# Patient Record
Sex: Male | Born: 1980 | Race: White | Hispanic: No | Marital: Single | Smoking: Never smoker
Health system: Southern US, Community
[De-identification: ages and names within clinical notes are randomized; demographics above are authoritative.]

## PROBLEM LIST (undated history)

## (undated) ENCOUNTER — Emergency Department (HOSPITAL_COMMUNITY): Admission: EM | Discharge status: Against Medical Advice | Payer: BC Managed Care – PPO

## (undated) DIAGNOSIS — G319 Degenerative disease of nervous system, unspecified: Secondary | ICD-10-CM

## (undated) DIAGNOSIS — T8859XA Other complications of anesthesia, initial encounter: Secondary | ICD-10-CM

## (undated) DIAGNOSIS — T4145XA Adverse effect of unspecified anesthetic, initial encounter: Secondary | ICD-10-CM

## (undated) DIAGNOSIS — G809 Cerebral palsy, unspecified: Secondary | ICD-10-CM

## (undated) DIAGNOSIS — D696 Thrombocytopenia, unspecified: Secondary | ICD-10-CM

## (undated) DIAGNOSIS — M419 Scoliosis, unspecified: Secondary | ICD-10-CM

## (undated) DIAGNOSIS — M7040 Prepatellar bursitis, unspecified knee: Secondary | ICD-10-CM

## (undated) DIAGNOSIS — E785 Hyperlipidemia, unspecified: Secondary | ICD-10-CM

## (undated) DIAGNOSIS — H9190 Unspecified hearing loss, unspecified ear: Secondary | ICD-10-CM

## (undated) DIAGNOSIS — IMO0001 Reserved for inherently not codable concepts without codable children: Secondary | ICD-10-CM

## (undated) DIAGNOSIS — J309 Allergic rhinitis, unspecified: Secondary | ICD-10-CM

## (undated) DIAGNOSIS — G709 Myoneural disorder, unspecified: Secondary | ICD-10-CM

## (undated) DIAGNOSIS — T7840XA Allergy, unspecified, initial encounter: Secondary | ICD-10-CM

## (undated) DIAGNOSIS — Z87442 Personal history of urinary calculi: Secondary | ICD-10-CM

## (undated) DIAGNOSIS — N200 Calculus of kidney: Secondary | ICD-10-CM

## (undated) HISTORY — PX: MYRINGOTOMY: SUR874

## (undated) HISTORY — DX: Reserved for inherently not codable concepts without codable children: IMO0001

## (undated) HISTORY — DX: Scoliosis, unspecified: M41.9

## (undated) HISTORY — DX: Unspecified hearing loss, unspecified ear: H91.90

## (undated) HISTORY — PX: ADENOIDECTOMY: SUR15

## (undated) HISTORY — PX: OTHER SURGICAL HISTORY: SHX169

## (undated) HISTORY — DX: Allergy, unspecified, initial encounter: T78.40XA

## (undated) HISTORY — DX: Thrombocytopenia, unspecified: D69.6

## (undated) HISTORY — PX: EYE SURGERY: SHX253

## (undated) HISTORY — DX: Hyperlipidemia, unspecified: E78.5

## (undated) HISTORY — DX: Prepatellar bursitis, unspecified knee: M70.40

## (undated) HISTORY — DX: Degenerative disease of nervous system, unspecified: G31.9

## (undated) HISTORY — DX: Cerebral palsy, unspecified: G80.9

---

## 2005-04-19 ENCOUNTER — Ambulatory Visit: Payer: Self-pay | Admitting: Internal Medicine

## 2005-05-10 ENCOUNTER — Ambulatory Visit: Payer: Self-pay | Admitting: Internal Medicine

## 2006-08-29 ENCOUNTER — Ambulatory Visit: Payer: Self-pay | Admitting: Internal Medicine

## 2007-06-26 DIAGNOSIS — Q150 Congenital glaucoma: Secondary | ICD-10-CM | POA: Insufficient documentation

## 2007-06-26 DIAGNOSIS — M4120 Other idiopathic scoliosis, site unspecified: Secondary | ICD-10-CM | POA: Insufficient documentation

## 2007-06-26 DIAGNOSIS — H900 Conductive hearing loss, bilateral: Secondary | ICD-10-CM

## 2007-06-26 DIAGNOSIS — G809 Cerebral palsy, unspecified: Secondary | ICD-10-CM

## 2007-06-26 DIAGNOSIS — D696 Thrombocytopenia, unspecified: Secondary | ICD-10-CM | POA: Insufficient documentation

## 2007-06-26 DIAGNOSIS — J309 Allergic rhinitis, unspecified: Secondary | ICD-10-CM | POA: Insufficient documentation

## 2007-06-28 ENCOUNTER — Ambulatory Visit: Payer: Self-pay | Admitting: Internal Medicine

## 2007-06-28 LAB — CONVERTED CEMR LAB
ALT: 23 units/L (ref 0–53)
AST: 15 units/L (ref 0–37)
Albumin: 3.9 g/dL (ref 3.5–5.2)
Alkaline Phosphatase: 59 units/L (ref 39–117)
BUN: 11 mg/dL (ref 6–23)
Basophils Absolute: 0 10*3/uL (ref 0.0–0.1)
Basophils Relative: 0.1 % (ref 0.0–1.0)
Bilirubin Urine: NEGATIVE
Bilirubin, Direct: 0.1 mg/dL (ref 0.0–0.3)
Blood in Urine, dipstick: NEGATIVE
CO2: 32 meq/L (ref 19–32)
Calcium: 9.8 mg/dL (ref 8.4–10.5)
Chloride: 107 meq/L (ref 96–112)
Cholesterol: 215 mg/dL (ref 0–200)
Creatinine, Ser: 0.8 mg/dL (ref 0.4–1.5)
Direct LDL: 157.8 mg/dL
Eosinophils Absolute: 0.5 10*3/uL (ref 0.0–0.6)
Eosinophils Relative: 7.3 % — ABNORMAL HIGH (ref 0.0–5.0)
GFR calc Af Amer: 149 mL/min
GFR calc non Af Amer: 123 mL/min
Glucose, Bld: 89 mg/dL (ref 70–99)
Glucose, Urine, Semiquant: NEGATIVE
HCT: 45.8 % (ref 39.0–52.0)
HDL: 38.2 mg/dL — ABNORMAL LOW (ref >39.0)
Hemoglobin: 15.5 g/dL (ref 13.0–17.0)
Ketones, urine, test strip: NEGATIVE
Lymphocytes Relative: 25.2 % (ref 12.0–46.0)
MCHC: 33.8 g/dL (ref 30.0–36.0)
MCV: 91.7 fL (ref 78.0–100.0)
Monocytes Absolute: 0.7 10*3/uL (ref 0.2–0.7)
Monocytes Relative: 9.1 % (ref 3.0–11.0)
Neutro Abs: 4.3 10*3/uL (ref 1.4–7.7)
Neutrophils Relative %: 58.3 % (ref 43.0–77.0)
Nitrite: NEGATIVE
Platelets: 129 10*3/uL — ABNORMAL LOW (ref 150–400)
Potassium: 4.3 meq/L (ref 3.5–5.1)
Protein, U semiquant: NEGATIVE
RBC: 4.99 M/uL (ref 4.22–5.81)
RDW: 11.9 % (ref 11.5–14.6)
Sodium: 142 meq/L (ref 135–145)
Specific Gravity, Urine: 1.02
TSH: 0.78 microintl units/mL (ref 0.35–5.50)
Total Bilirubin: 1.4 mg/dL — ABNORMAL HIGH (ref 0.3–1.2)
Total CHOL/HDL Ratio: 5.6
Total Protein: 6.6 g/dL (ref 6.0–8.3)
Triglycerides: 111 mg/dL (ref 0–149)
Urobilinogen, UA: NEGATIVE
VLDL: 22 mg/dL (ref 0–40)
WBC Urine, dipstick: NEGATIVE
WBC: 7.3 10*3/uL (ref 4.5–10.5)
pH: 7

## 2007-07-18 ENCOUNTER — Ambulatory Visit: Payer: Self-pay | Admitting: Internal Medicine

## 2007-07-18 DIAGNOSIS — E785 Hyperlipidemia, unspecified: Secondary | ICD-10-CM | POA: Insufficient documentation

## 2007-07-27 ENCOUNTER — Encounter: Payer: Self-pay | Admitting: Internal Medicine

## 2007-07-27 ENCOUNTER — Ambulatory Visit (HOSPITAL_COMMUNITY): Admission: RE | Admit: 2007-07-27 | Discharge: 2007-07-27 | Payer: Self-pay | Admitting: Internal Medicine

## 2007-07-31 ENCOUNTER — Telehealth: Payer: Self-pay | Admitting: Internal Medicine

## 2007-08-02 ENCOUNTER — Encounter: Admission: RE | Admit: 2007-08-02 | Discharge: 2007-08-02 | Payer: Self-pay | Admitting: Internal Medicine

## 2007-08-03 ENCOUNTER — Telehealth: Payer: Self-pay | Admitting: Internal Medicine

## 2007-08-08 ENCOUNTER — Encounter: Admission: RE | Admit: 2007-08-08 | Discharge: 2007-11-06 | Payer: Self-pay | Admitting: Internal Medicine

## 2007-08-28 ENCOUNTER — Encounter: Payer: Self-pay | Admitting: Internal Medicine

## 2007-12-24 ENCOUNTER — Telehealth (INDEPENDENT_AMBULATORY_CARE_PROVIDER_SITE_OTHER): Payer: Self-pay | Admitting: *Deleted

## 2008-04-01 ENCOUNTER — Encounter: Payer: Self-pay | Admitting: Internal Medicine

## 2008-04-01 ENCOUNTER — Telehealth (INDEPENDENT_AMBULATORY_CARE_PROVIDER_SITE_OTHER): Payer: Self-pay | Admitting: *Deleted

## 2008-07-28 ENCOUNTER — Encounter: Payer: Self-pay | Admitting: Internal Medicine

## 2008-10-14 ENCOUNTER — Telehealth: Payer: Self-pay | Admitting: Internal Medicine

## 2008-10-17 ENCOUNTER — Ambulatory Visit: Payer: Self-pay | Admitting: Internal Medicine

## 2008-11-11 ENCOUNTER — Encounter: Payer: Self-pay | Admitting: Internal Medicine

## 2008-11-28 ENCOUNTER — Ambulatory Visit: Payer: Self-pay | Admitting: Family Medicine

## 2008-11-28 DIAGNOSIS — A088 Other specified intestinal infections: Secondary | ICD-10-CM

## 2008-11-28 DIAGNOSIS — L02419 Cutaneous abscess of limb, unspecified: Secondary | ICD-10-CM

## 2008-11-28 DIAGNOSIS — L03119 Cellulitis of unspecified part of limb: Secondary | ICD-10-CM

## 2008-11-29 ENCOUNTER — Telehealth (INDEPENDENT_AMBULATORY_CARE_PROVIDER_SITE_OTHER): Payer: Self-pay | Admitting: *Deleted

## 2008-11-29 ENCOUNTER — Inpatient Hospital Stay (HOSPITAL_COMMUNITY): Admission: EM | Admit: 2008-11-29 | Discharge: 2008-12-09 | Payer: Self-pay | Admitting: Emergency Medicine

## 2008-11-29 ENCOUNTER — Ambulatory Visit: Payer: Self-pay | Admitting: Internal Medicine

## 2008-11-29 ENCOUNTER — Encounter: Payer: Self-pay | Admitting: Internal Medicine

## 2008-11-29 DIAGNOSIS — M009 Pyogenic arthritis, unspecified: Secondary | ICD-10-CM | POA: Insufficient documentation

## 2008-11-29 DIAGNOSIS — R1011 Right upper quadrant pain: Secondary | ICD-10-CM | POA: Insufficient documentation

## 2008-11-29 DIAGNOSIS — N189 Chronic kidney disease, unspecified: Secondary | ICD-10-CM

## 2008-12-04 ENCOUNTER — Ambulatory Visit: Payer: Self-pay | Admitting: Vascular Surgery

## 2008-12-04 ENCOUNTER — Encounter (INDEPENDENT_AMBULATORY_CARE_PROVIDER_SITE_OTHER): Payer: Self-pay | Admitting: Orthopedic Surgery

## 2008-12-08 ENCOUNTER — Ambulatory Visit: Payer: Self-pay | Admitting: Infectious Diseases

## 2008-12-15 ENCOUNTER — Ambulatory Visit: Payer: Self-pay | Admitting: Internal Medicine

## 2010-01-14 ENCOUNTER — Encounter: Payer: Self-pay | Admitting: Internal Medicine

## 2010-02-16 ENCOUNTER — Encounter: Payer: Self-pay | Admitting: Internal Medicine

## 2010-08-10 ENCOUNTER — Encounter: Payer: Self-pay | Admitting: Internal Medicine

## 2010-08-25 ENCOUNTER — Telehealth: Payer: Self-pay | Admitting: Internal Medicine

## 2010-08-25 ENCOUNTER — Encounter: Payer: Self-pay | Admitting: Internal Medicine

## 2010-11-14 HISTORY — PX: OTHER SURGICAL HISTORY: SHX169

## 2010-11-30 ENCOUNTER — Encounter: Payer: Self-pay | Admitting: Internal Medicine

## 2010-12-14 NOTE — Letter (Signed)
Summary: Eye Exam/[facility]  Eye [facility]   Imported By: Maryln Gottron 08/19/2010 13:31:50  _____________________________________________________________________  External Attachment:    Type:   Image     Comment:   External Document

## 2010-12-14 NOTE — Letter (Signed)
Summary: Generic Letter  [place] at [facility]  [address] [place], [place] [address]   Phone: [phone]  Fax: [phone]    08/25/2010  [facility] [address] [place], [place]  [address]  TO WHOM IT MAY CONCERN  Due to this pt's medical condition........cerebral palsy, and chronic use of wheelchair, he needs to have a wheelchair accessible bathroom.  If you have any other questions, please do not hesitate to contact us.           Sincerely,   Birdie Sons, MD

## 2010-12-14 NOTE — Letter (Signed)
Summary: Eye Exam/[facility]  Eye [facility]   Imported By: Maryln Gottron 02/23/2010 15:26:00  _____________________________________________________________________  External Attachment:    Type:   Image     Comment:   External Document

## 2010-12-14 NOTE — Letter (Signed)
Summary: Generic Letter  [place] at [facility]  [address] [place], [place] [address]   Phone: [phone]  Fax: [phone]    01/14/2010  [facility] [address] [place], [place]  [address]   To Whom It May Concern,  The above patient has cerebral palsy and scoliosis so that he is wheelchair bound.  These are permanent disabilities.  He also has conductive hearing loss so that he is unable to communicate with others and this too is permanent.  Due to these disablities [place]  He requires the [employer] bus that accommodates his wheelchair in the lock-in-mode as he is unable to transfer on and off the wheelchair by himself.   Thankyou for this consideration.   Arayna Illescas Rexene Edison Missi Mcmackin, MD          Sincerely,   Gladis Riffle, RN

## 2010-12-14 NOTE — Progress Notes (Signed)
Summary: WHEEL CHAIR ACCESSIBLE BATHROOM  Phone Note Call from Patient Call back at Home Phone [phone]   Caller: Mom-[name] Call For: Birdie Sons MD Summary of Call: Mom IS REQUESTING A LETTER OF MEDICAL NECESSITY FOR PT TO HAVE WHEELCHAIR ACCESSIBLE BATHROOM. Initial call taken by: Heron Sabins,  August 25, 2010 10:20 AM  Follow-up for Phone Call        ok Follow-up by: Birdie Sons MD,  August 25, 2010 11:50 AM  Additional Follow-up for Phone Call Additional follow up Details #1::        Letter completed and Mom notified. Additional Follow-up by: Lynann Beaver CMA,  August 25, 2010 12:51 PM

## 2010-12-22 NOTE — Letter (Signed)
Summary: [facility]   Imported By: Maryln Gottron 12/14/2010 12:52:02  _____________________________________________________________________  External Attachment:    Type:   Image     Comment:   External Document

## 2011-01-24 ENCOUNTER — Encounter: Payer: Self-pay | Admitting: Internal Medicine

## 2011-01-27 ENCOUNTER — Other Ambulatory Visit: Payer: BC Managed Care – PPO

## 2011-01-27 ENCOUNTER — Other Ambulatory Visit: Payer: Self-pay | Admitting: Internal Medicine

## 2011-01-27 ENCOUNTER — Encounter (INDEPENDENT_AMBULATORY_CARE_PROVIDER_SITE_OTHER): Payer: Self-pay | Admitting: *Deleted

## 2011-01-27 DIAGNOSIS — Z0000 Encounter for general adult medical examination without abnormal findings: Secondary | ICD-10-CM

## 2011-01-27 DIAGNOSIS — E785 Hyperlipidemia, unspecified: Secondary | ICD-10-CM

## 2011-01-27 LAB — LDL CHOLESTEROL, DIRECT: Direct LDL: 163.1 mg/dL

## 2011-01-27 LAB — BASIC METABOLIC PANEL
BUN: 15 mg/dL (ref 6–23)
Calcium: 9.5 mg/dL (ref 8.4–10.5)
GFR: 111.76 mL/min (ref >60.00)
Potassium: 4 mEq/L (ref 3.5–5.1)
Sodium: 140 mEq/L (ref 135–145)

## 2011-01-27 LAB — URINALYSIS
Hgb urine dipstick: NEGATIVE
Leukocytes, UA: NEGATIVE
Nitrite: NEGATIVE
Specific Gravity, Urine: 1.015 (ref 1.000–1.030)
Urine Glucose: NEGATIVE
Urobilinogen, UA: 0.2 (ref 0.0–1.0)

## 2011-01-27 LAB — HEPATIC FUNCTION PANEL
ALT: 24 U/L (ref 0–53)
AST: 16 U/L (ref 0–37)
Alkaline Phosphatase: 65 U/L (ref 39–117)
Bilirubin, Direct: 0.1 mg/dL (ref 0.0–0.3)
Total Bilirubin: 0.9 mg/dL (ref 0.3–1.2)

## 2011-01-27 LAB — TSH: TSH: 0.72 u[IU]/mL (ref 0.35–5.50)

## 2011-01-27 LAB — LIPID PANEL
HDL: 42.1 mg/dL (ref >39.00)
Total CHOL/HDL Ratio: 5
VLDL: 29.6 mg/dL (ref 0.0–40.0)

## 2011-01-27 LAB — CBC WITH DIFFERENTIAL/PLATELET
Basophils Absolute: 0 10*3/uL (ref 0.0–0.1)
Eosinophils Relative: 3.6 % (ref 0.0–5.0)
HCT: 44.4 % (ref 39.0–52.0)
Lymphocytes Relative: 21.2 % (ref 12.0–46.0)
Lymphs Abs: 1.4 10*3/uL (ref 0.7–4.0)
Monocytes Relative: 8.3 % (ref 3.0–12.0)
Platelets: 112 10*3/uL — ABNORMAL LOW (ref 150.0–400.0)
RDW: 12.8 % (ref 11.5–14.6)
WBC: 6.7 10*3/uL (ref 4.5–10.5)

## 2011-02-07 ENCOUNTER — Encounter: Payer: Self-pay | Admitting: Internal Medicine

## 2011-02-28 LAB — URINALYSIS, ROUTINE W REFLEX MICROSCOPIC
Bilirubin Urine: NEGATIVE
Ketones, ur: NEGATIVE mg/dL
Leukocytes, UA: NEGATIVE
Nitrite: NEGATIVE
Protein, ur: 100 mg/dL — AB
pH: 5.5 (ref 5.0–8.0)

## 2011-02-28 LAB — COMPREHENSIVE METABOLIC PANEL
ALT: 31 U/L (ref 0–53)
Alkaline Phosphatase: 78 U/L (ref 39–117)
Alkaline Phosphatase: 82 U/L (ref 39–117)
BUN: 11 mg/dL (ref 6–23)
BUN: 35 mg/dL — ABNORMAL HIGH (ref 6–23)
CO2: 29 mEq/L (ref 19–32)
GFR calc non Af Amer: 47 mL/min — ABNORMAL LOW (ref >60)
Glucose, Bld: 107 mg/dL — ABNORMAL HIGH (ref 70–99)
Glucose, Bld: 93 mg/dL (ref 70–99)
Potassium: 3.6 mEq/L (ref 3.5–5.1)
Potassium: 4.1 mEq/L (ref 3.5–5.1)
Sodium: 135 mEq/L (ref 135–145)
Total Protein: 5.8 g/dL — ABNORMAL LOW (ref 6.0–8.3)

## 2011-02-28 LAB — DIFFERENTIAL
Basophils Absolute: 0 10*3/uL (ref 0.0–0.1)
Basophils Absolute: 0 10*3/uL (ref 0.0–0.1)
Basophils Relative: 0 % (ref 0–1)
Basophils Relative: 0 % (ref 0–1)
Eosinophils Absolute: 0 10*3/uL (ref 0.0–0.7)
Eosinophils Absolute: 0.2 10*3/uL (ref 0.0–0.7)
Eosinophils Absolute: 0.3 10*3/uL (ref 0.0–0.7)
Eosinophils Relative: 0 % (ref 0–5)
Eosinophils Relative: 1 % (ref 0–5)
Eosinophils Relative: 2 % (ref 0–5)
Lymphocytes Relative: 1 % — ABNORMAL LOW (ref 12–46)
Lymphocytes Relative: 3 % — ABNORMAL LOW (ref 12–46)
Lymphs Abs: 0.2 10*3/uL — ABNORMAL LOW (ref 0.7–4.0)
Lymphs Abs: 0.5 10*3/uL — ABNORMAL LOW (ref 0.7–4.0)
Lymphs Abs: 2.7 10*3/uL (ref 0.7–4.0)
Monocytes Absolute: 0.3 10*3/uL (ref 0.1–1.0)
Monocytes Absolute: 0.7 10*3/uL (ref 0.1–1.0)
Monocytes Absolute: 1.2 10*3/uL — ABNORMAL HIGH (ref 0.1–1.0)
Monocytes Relative: 2 % — ABNORMAL LOW (ref 3–12)
Monocytes Relative: 3 % (ref 3–12)
Monocytes Relative: 9 % (ref 3–12)
Neutro Abs: 15.1 10*3/uL — ABNORMAL HIGH (ref 1.7–7.7)
Neutro Abs: 23.1 10*3/uL — ABNORMAL HIGH (ref 1.7–7.7)
Neutrophils Relative %: 94 % — ABNORMAL HIGH (ref 43–77)
Neutrophils Relative %: 96 % — ABNORMAL HIGH (ref 43–77)
WBC Morphology: INCREASED
WBC Morphology: INCREASED

## 2011-02-28 LAB — CBC
HCT: 37.4 % — ABNORMAL LOW (ref 39.0–52.0)
HCT: 37.8 % — ABNORMAL LOW (ref 39.0–52.0)
HCT: 37.9 % — ABNORMAL LOW (ref 39.0–52.0)
HCT: 39.3 % (ref 39.0–52.0)
HCT: 39.5 % (ref 39.0–52.0)
HCT: 42.1 % (ref 39.0–52.0)
HCT: 48.8 % (ref 39.0–52.0)
Hemoglobin: 12.5 g/dL — ABNORMAL LOW (ref 13.0–17.0)
Hemoglobin: 12.9 g/dL — ABNORMAL LOW (ref 13.0–17.0)
Hemoglobin: 13.1 g/dL (ref 13.0–17.0)
Hemoglobin: 13.2 g/dL (ref 13.0–17.0)
Hemoglobin: 14.1 g/dL (ref 13.0–17.0)
Hemoglobin: 16.4 g/dL (ref 13.0–17.0)
MCHC: 33.5 g/dL (ref 30.0–36.0)
MCHC: 33.6 g/dL (ref 30.0–36.0)
MCHC: 33.6 g/dL (ref 30.0–36.0)
MCHC: 34.2 g/dL (ref 30.0–36.0)
MCHC: 34.4 g/dL (ref 30.0–36.0)
MCV: 89.8 fL (ref 78.0–100.0)
MCV: 91.8 fL (ref 78.0–100.0)
MCV: 91.9 fL (ref 78.0–100.0)
MCV: 91.9 fL (ref 78.0–100.0)
MCV: 92.1 fL (ref 78.0–100.0)
MCV: 92.2 fL (ref 78.0–100.0)
MCV: 93.4 fL (ref 78.0–100.0)
Platelets: 164 10*3/uL (ref 150–400)
Platelets: 431 10*3/uL — ABNORMAL HIGH (ref 150–400)
Platelets: 451 10*3/uL — ABNORMAL HIGH (ref 150–400)
Platelets: 81 10*3/uL — ABNORMAL LOW (ref 150–400)
Platelets: 89 10*3/uL — ABNORMAL LOW (ref 150–400)
Platelets: 93 10*3/uL — ABNORMAL LOW (ref 150–400)
Platelets: 94 10*3/uL — ABNORMAL LOW (ref 150–400)
RBC: 4.11 MIL/uL — ABNORMAL LOW (ref 4.22–5.81)
RBC: 4.28 MIL/uL (ref 4.22–5.81)
RBC: 4.29 MIL/uL (ref 4.22–5.81)
RBC: 4.33 MIL/uL (ref 4.22–5.81)
RBC: 4.58 MIL/uL (ref 4.22–5.81)
RBC: 5.3 MIL/uL (ref 4.22–5.81)
RDW: 12.3 % (ref 11.5–15.5)
RDW: 12.5 % (ref 11.5–15.5)
RDW: 12.7 % (ref 11.5–15.5)
RDW: 12.7 % (ref 11.5–15.5)
RDW: 12.8 % (ref 11.5–15.5)
RDW: 13.1 % (ref 11.5–15.5)
WBC: 13.6 10*3/uL — ABNORMAL HIGH (ref 4.0–10.5)
WBC: 16.1 10*3/uL — ABNORMAL HIGH (ref 4.0–10.5)
WBC: 17.8 10*3/uL — ABNORMAL HIGH (ref 4.0–10.5)
WBC: 18 10*3/uL — ABNORMAL HIGH (ref 4.0–10.5)
WBC: 18.6 10*3/uL — ABNORMAL HIGH (ref 4.0–10.5)
WBC: 24 10*3/uL — ABNORMAL HIGH (ref 4.0–10.5)

## 2011-02-28 LAB — BASIC METABOLIC PANEL
BUN: 29 mg/dL — ABNORMAL HIGH (ref 6–23)
BUN: 9 mg/dL (ref 6–23)
CO2: 25 mEq/L (ref 19–32)
CO2: 26 mEq/L (ref 19–32)
Calcium: 7.8 mg/dL — ABNORMAL LOW (ref 8.4–10.5)
Calcium: 8.6 mg/dL (ref 8.4–10.5)
Chloride: 102 mEq/L (ref 96–112)
Chloride: 105 mEq/L (ref 96–112)
Creatinine, Ser: 0.86 mg/dL (ref 0.4–1.5)
Creatinine, Ser: 1.39 mg/dL (ref 0.4–1.5)
GFR calc Af Amer: >60 mL/min (ref >60)
GFR calc Af Amer: >60 mL/min (ref >60)
GFR calc non Af Amer: >60 mL/min (ref >60)
GFR calc non Af Amer: >60 mL/min (ref >60)
Glucose, Bld: 111 mg/dL — ABNORMAL HIGH (ref 70–99)
Glucose, Bld: 93 mg/dL (ref 70–99)
Potassium: 3.1 mEq/L — ABNORMAL LOW (ref 3.5–5.1)
Potassium: 3.3 mEq/L — ABNORMAL LOW (ref 3.5–5.1)
Sodium: 136 mEq/L (ref 135–145)
Sodium: 137 mEq/L (ref 135–145)

## 2011-02-28 LAB — CULTURE, BLOOD (ROUTINE X 2): Culture: NO GROWTH

## 2011-02-28 LAB — URINE MICROSCOPIC-ADD ON

## 2011-02-28 LAB — LIPASE, BLOOD: Lipase: 16 U/L (ref 11–59)

## 2011-02-28 LAB — RAPID STREP SCREEN (MED CTR MEBANE ONLY): Streptococcus, Group A Screen (Direct): NEGATIVE

## 2011-03-14 ENCOUNTER — Encounter: Payer: Self-pay | Admitting: Internal Medicine

## 2011-03-14 ENCOUNTER — Ambulatory Visit (INDEPENDENT_AMBULATORY_CARE_PROVIDER_SITE_OTHER): Payer: BC Managed Care – PPO | Admitting: Internal Medicine

## 2011-03-14 VITALS — BP 124/86 | HR 88 | Temp 98.3°F | Ht 68.0 in | Wt 150.0 lb

## 2011-03-14 DIAGNOSIS — Z0000 Encounter for general adult medical examination without abnormal findings: Secondary | ICD-10-CM

## 2011-03-14 NOTE — Progress Notes (Signed)
  Subjective:    Patient ID: Edward Alvarado, male    DOB: [birth year], 30 y.o.   MRN: [id number]  HPI  cpx  Past Medical History  Diagnosis Date  . Allergy   . Cerebral palsy   . Scoliosis   . Deafness     hearing aids  . Glaucoma   . Hyperlipidemia   . Platelets decreased    Past Surgical History  Procedure Date  . Multiple orthopedic procedures   . Myringotomy   . Eye surgery 2012    [facility]    reports that he has never smoked. He has never used smokeless tobacco. He reports that he does not drink alcohol. His drug history not on file. family history is not on file. Allergies  Allergen Reactions  . Iodine     REACTION: sensitivity     Review of Systems  patient denies chest pain, shortness of breath, orthopnea. Denies lower extremity edema, abdominal pain, change in appetite, change in bowel movements. Patient denies rashes, musculoskeletal complaints. No other specific complaints in a complete review of systems.  He is wheelchair bound Chronic hearing loss     Objective:   Physical Exam  well-developed well-nourished male in no acute distress. HEENT exam atraumatic, normocephalic, neck supple without jugular venous distention. Chest clear to auscultation cardiac exam S1-S2 are regular. Abdominal exam overweight with bowel sounds, soft and nontender. Extremities no edema. Neurologic exam is alert. Surgical scar on back     Assessment & Plan:  Well visit Health maint utd

## 2011-03-24 ENCOUNTER — Other Ambulatory Visit: Payer: Self-pay | Admitting: Internal Medicine

## 2011-03-24 DIAGNOSIS — G809 Cerebral palsy, unspecified: Secondary | ICD-10-CM

## 2011-03-24 NOTE — Progress Notes (Signed)
Also fax order to Bhavik Cabiness [phone]

## 2011-03-29 NOTE — Discharge Summary (Signed)
NAMEAZARION, HOVE              ACCOUNT NO.:  [id number]   MEDICAL RECORD NO.:  [id number]          PATIENT TYPE:  INP   LOCATION:  5040                         FACILITY:  [facility]   PHYSICIAN:  Valerie A. Felicity Coyer, MDDATE OF BIRTH:  1981/06/15   DATE OF ADMISSION:  11/29/2008  DATE OF DISCHARGE:  12/09/2008                               DISCHARGE SUMMARY   DISCHARGE DIAGNOSES:  1. Right lower extremity cellulitis secondary to patellar bursitis,      improved, continue outpatient antibiotics 1 week more, see details      below.  2. Leukocytosis with low-grade fever secondary to above, improved,      discharge white count 11.6 with T-max 100.4, close outpatient      followup.  3. Dehydration prior to admission, resolved.  4. Cerebral palsy with congenital deafness, at baseline.  5. Mild chronic thrombocytopenia.  6. Mild anemia, question laboratory error.   DISCHARGE MEDICATIONS:  1. Doxycycline 100 mg p.o. b.i.d. x7 additional days.  2. Avelox 400 mg p.o. daily x7 additional days.   OTHER MEDICATIONS:  As prior to admission include:  1. Loratadine 10 mg p.o. daily.  2. Multivitamin once daily.  3. Combigan ophthalmic drops 1 drop bilaterally b.i.d.  4. Lumigan ophthalmic drops 1 drop bilaterally b.i.d.   DISPOSITION:  The patient is discharged home where he is under the care  of his parents.  He is wheelchair bound with recommendations to remain  off his right knee for the next week while on antibiotics, though he has  clinically much improved with minimal swelling.  No warmth and no  erythema of his right lower extremity.  Hospital followup is scheduled  with primary care physician, Dr. Birdie Sons, for Monday, December 15, 2008, at 8:45 a.m.   CONSULTATIONS ON THIS HOSPITALIZATION:  1. Orthopedics, Leonides Grills, MD, [facility], to call as      needed.  2. Infectious Disease, Mick Sell, MD   MRI at the time of admission shows no evidence of septic  arthritis.   Lower extremity Dopplers performed on December 04, 2008, showed no  evidence of DVT or superficial thrombus bilaterally.   CONDITION ON DISCHARGE:  Medically improved and stable, asymptomatic and  clinically well.   HOSPITAL COURSE BY PROBLEMS:  1. Right lower extremity cellulitis with patellar bursitis:  The      patient is a 30 year old gentleman, wheelchair bound but unable to      assist in transfers, who is congenitally deaf, and has cerebral      palsy, came to the emergency room with less than 24 hours of sudden      onset redness and swelling of his right lower extremity.  There was      no obvious injury to his leg or knee in the preceding days or hours      to the onset of symptoms, and they came to the emergency room for      further evaluation where he was found to be markedly febrile with      concern for pyogenic arthritis due to a white cell  count at 24.  He      was seen in consultation by Dr. Lestine Box in [facility],      and he underwent an MRI prior considering a knee scope.      Fortunately, this showed no evidence of joint involvement with no      abscess or joint effusion but marked bursitis changes with      associated cellulitis at the knee and leg.  As such, an option was      made to treat him only with IV antibiotics, so vancomycin and Zosyn      were continued.  The patient continued to have high spiking fevers      throughout the early part of this hospitalization, ranging up to      105.0 and 102, gradually downward decreasing, treated with Tylenol      and Advil on an alternating basis.  His white count was improving      but then began to rise again despite clinical improvement in the      patient's symptoms and gradual defervescence.  After a week of IV      antibiotics, he was changed to oral doxycycline with Avelox.  Given      his clinical improvement but persisted in having a white count of      18, so he was seen in  consultation by Infectious Disease who felt      that having no other clinical symptoms of other ongoing infection      that the persisting low-grade fevers and leukocytosis were still      likely due to septic bursitis and recommended continued treatment      as ongoing given clinical improvement.  The patient persisted with      his clinical improvement including resolution of his erythema and      continued decreased swelling of his knee and leg.  There was no      evidence of DVT, and his white count also began to improve down to      13.6 and then 11.6 on the day of discharge.  He was felt stable for      discharge home from a medical standpoint as he is hemodynamically      stable with only low-grade fevers.  He will continue another week      of oral antibiotics to ensure complete resolution with close      outpatient followup with primary MD.  Within this time for a      recheck of labs is indicated.  This has been reviewed with the      patient's parents who are responsible for his care, who had      verbalized understanding and agreement.  They will contact their      orthopedic physician on an as-needed basis with reports available      within the [facility] for review as needed or as desired.  All      other medical issues are stable and without change.   Greater than 30 minutes on the day of discharge for coordination of  discharge planning.      Valerie A. Felicity Coyer, MD  Electronically Signed     VAL/MEDQ  D:  12/09/2008  T:  12/10/2008  Job:  [id number]

## 2011-03-29 NOTE — Consult Note (Signed)
NAMEYAZIR, KOERBER              ACCOUNT NO.:  [id number]   MEDICAL RECORD NO.:  [id number]          PATIENT TYPE:  INP   LOCATION:  3313                         FACILITY:  [facility]   PHYSICIAN:  Leonides Grills, M.D.     DATE OF BIRTH:  [birth year]   DATE OF CONSULTATION:  11/30/2008  DATE OF DISCHARGE:                                 CONSULTATION   CHIEF COMPLAINT:  Right knee, leg pain and redness x3 days after  extensive crawling.   HISTORY:  This is a 30 year old male who has extensive cerebral palsy,  nonambulatory, and essentially crawls from place to place.  He had a  previous I&D of prepatellar bursitis in the past.  His right knee has  been progressively getting swollen and red and extending down to  pretibial area.  He has cerebral palsy and is deaf.  He has allergy to  iodine.  He does not smoke and takes Tylenol p.r.n.   PHYSICAL EXAMINATION:  On admission to the ER, his temperature was 99.4  and has been rising since.  He has a range of motion of his right knee  from approximately 10-90 degrees with minimal discomfort.  He has  erythema in anterior aspect of the right knee, that extends into the  pretibial area.  He has a boggy swelling to the pretibial area that is  symmetric bilaterally, even on the uninvolved contralateral knee.  Palpable dorsalis pedis and posterior tibial pulses.  He is intact to  light touch.  Current temperature is 101.4.  White count is 24.  MRI  shows extensive prepatellar cellulitis, no evidence of abscess or knee  effusion.   IMPRESSION:  Cellulitis, right knee and leg.   PLAN:  At this point, there is no surgical intervention.  He is to  receive IV antibiotics.  He is currently on     Zosyn and vancomycin.  Warm moist compresses to the right leg and knee are encouraged.      Leonides Grills, M.D.  Electronically Signed     PB/MEDQ  D:  11/30/2008  T:  11/30/2008  Job:  [id number]

## 2011-04-01 NOTE — Letter (Signed)
January 31, 2007     RE:  Edward Alvarado, Edward Alvarado  MRN:  [id number]  /  DOB:  01-22-1981   To whom it may concern:   Edward Alvarado has been a longterm patient of mine.  He suffers with  cerebral palsy.  He has been, and will continue to be, completely and  totally disabled.  He requires 24-hour 7-day-a-week care.  If you have  any questions, please contact me.    Sincerely,      Bruce H. Swords, MD  Electronically Signed    BHS/MedQ  DD: 01/31/2007  DT: 01/31/2007  Job #: [id number]

## 2011-09-13 ENCOUNTER — Telehealth: Payer: Self-pay | Admitting: Internal Medicine

## 2011-09-13 NOTE — Telephone Encounter (Signed)
Pt  decline to see another MD. Pt has loss of appetite over a month now with wt loss. Dad is requesting ov with dr swords wed or thur this week. Doc has ov slot for 09-16-2011 dad also decline.

## 2011-09-15 NOTE — Telephone Encounter (Signed)
Have pt come in at 8:15 tomorrow per Dr Cato Mulligan

## 2011-09-15 NOTE — Telephone Encounter (Signed)
Pt dad larry cross would like dr to call him personally. Pt unable to come in tomorrow.

## 2011-09-16 NOTE — Telephone Encounter (Signed)
Dad called again. He would really like to talk to Dr Cato Mulligan. Dad refused several offers to see Another md. Please advise. Thanks.

## 2011-09-29 NOTE — Telephone Encounter (Signed)
Pt is sch for 11-07-11

## 2011-11-07 ENCOUNTER — Encounter: Payer: Self-pay | Admitting: Internal Medicine

## 2011-11-07 ENCOUNTER — Ambulatory Visit (INDEPENDENT_AMBULATORY_CARE_PROVIDER_SITE_OTHER): Payer: BC Managed Care – PPO | Admitting: Internal Medicine

## 2011-11-07 VITALS — BP 108/74 | HR 88 | Temp 97.6°F | Wt 118.0 lb

## 2011-11-07 DIAGNOSIS — R634 Abnormal weight loss: Secondary | ICD-10-CM | POA: Insufficient documentation

## 2011-11-07 LAB — CBC WITH DIFFERENTIAL/PLATELET
Basophils Relative: 0.3 % (ref 0.0–3.0)
Eosinophils Absolute: 0.3 10*3/uL (ref 0.0–0.7)
Eosinophils Relative: 4.7 % (ref 0.0–5.0)
Lymphocytes Relative: 24.7 % (ref 12.0–46.0)
MCHC: 34.4 g/dL (ref 30.0–36.0)
Neutrophils Relative %: 61.9 % (ref 43.0–77.0)
Platelets: 136 10*3/uL — ABNORMAL LOW (ref 150.0–400.0)
RBC: 5.02 Mil/uL (ref 4.22–5.81)
WBC: 6.1 10*3/uL (ref 4.5–10.5)

## 2011-11-07 LAB — BASIC METABOLIC PANEL
Calcium: 9.3 mg/dL (ref 8.4–10.5)
GFR: 128.48 mL/min (ref >60.00)
Sodium: 138 mEq/L (ref 135–145)

## 2011-11-07 LAB — HEPATIC FUNCTION PANEL
Alkaline Phosphatase: 78 U/L (ref 39–117)
Bilirubin, Direct: 0 mg/dL (ref 0.0–0.3)
Total Bilirubin: 0.7 mg/dL (ref 0.3–1.2)
Total Protein: 7.4 g/dL (ref 6.0–8.3)

## 2011-11-07 NOTE — Assessment & Plan Note (Signed)
Needs evaluation

## 2011-11-09 LAB — GLIA (IGA/G) + TTG IGA: Gliadin IgG: 4.8 U/mL (ref <20)

## 2011-11-10 ENCOUNTER — Encounter: Payer: Self-pay | Admitting: Internal Medicine

## 2011-11-10 ENCOUNTER — Other Ambulatory Visit: Payer: Self-pay | Admitting: Internal Medicine

## 2011-11-10 DIAGNOSIS — R634 Abnormal weight loss: Secondary | ICD-10-CM

## 2011-11-10 NOTE — Progress Notes (Signed)
Patient ID: Edward Alvarado, male   DOB: 05-20-1981, 30 y.o.   MRN: [id number] 29 year old patient with cerebral palsy and significant physical disabilities. He is also deaf. He has recently moved to his own home in a supervised facility. 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 He is being followed by ophthalmology at [facility]. The patient has no complaints. He is brought in by his parents concerned with weight loss. I reviewed flow sheets documenting a significant weight loss over the past year. Patient's parents recognize that he is eating less. There are times where he is quite hungry and eats a large meal but he is eating fewer of these and he is eating less regularly.  Past Medical History  Diagnosis Date  . Allergy   . Cerebral palsy   . Scoliosis   . Deafness     hearing aids  . Glaucoma   . Hyperlipidemia   . Platelets decreased    Past Surgical History  Procedure Date  . Multiple orthopedic procedures   . Myringotomy   . Eye surgery 2012    [facility]    reports that he has never smoked. He has never used smokeless tobacco. He reports that he does not drink alcohol. His drug history not on file. family history is not on file. Allergies  Allergen Reactions  . Iodine     REACTION: sensitivity    patient denies chest pain, shortness of breath, orthopnea. Denies lower extremity edema, abdominal pain, change in appetite, change in bowel movements. Patient denies rashes, musculoskeletal complaints. No other specific complaints in a complete review of systems.    On physical exam the patient appears in no acute distress. He is thin. He is sitting in a wheelchair. He has significant physical disabilities. HEENT exam atraumatic, neck is supple. Chest is clear to auscultation without increased work of breathing. Cardiac exam S1-S2 are regular. Abdominal exam active bowel sounds, soft. Extremities there is no significant  edema. Neurologic exam he is alert and interactive.

## 2011-11-14 ENCOUNTER — Telehealth: Payer: Self-pay | Admitting: *Deleted

## 2011-11-14 NOTE — Telephone Encounter (Signed)
Pts insurance has declined the CT and they are requesting you call the physician line.  The number is [phone] opt 3 Track# [id number]

## 2011-11-22 NOTE — Telephone Encounter (Signed)
Pt called to check status

## 2011-12-20 NOTE — Telephone Encounter (Signed)
Call patient Insurance won't cover imaging tests  Have them weigh Ed weekly...report weight in 4 weeks

## 2011-12-20 NOTE — Telephone Encounter (Signed)
pts mom aware.

## 2011-12-20 NOTE — Telephone Encounter (Signed)
Unsure about what this note means.  Thanks.

## 2012-03-12 ENCOUNTER — Other Ambulatory Visit (INDEPENDENT_AMBULATORY_CARE_PROVIDER_SITE_OTHER): Payer: BC Managed Care – PPO

## 2012-03-12 DIAGNOSIS — Z0000 Encounter for general adult medical examination without abnormal findings: Secondary | ICD-10-CM

## 2012-03-12 LAB — TSH: TSH: 1.26 u[IU]/mL (ref 0.35–5.50)

## 2012-03-12 LAB — LIPID PANEL
Cholesterol: 169 mg/dL (ref 0–200)
VLDL: 11.2 mg/dL (ref 0.0–40.0)

## 2012-03-12 LAB — HEPATIC FUNCTION PANEL
ALT: 17 U/L (ref 0–53)
AST: 16 U/L (ref 0–37)
Albumin: 3.9 g/dL (ref 3.5–5.2)
Total Protein: 6.8 g/dL (ref 6.0–8.3)

## 2012-03-12 LAB — CBC WITH DIFFERENTIAL/PLATELET
Basophils Relative: 0.3 % (ref 0.0–3.0)
Eosinophils Relative: 5.3 % — ABNORMAL HIGH (ref 0.0–5.0)
HCT: 44.3 % (ref 39.0–52.0)
Hemoglobin: 15.2 g/dL (ref 13.0–17.0)
Lymphs Abs: 1.7 10*3/uL (ref 0.7–4.0)
MCV: 90.9 fl (ref 78.0–100.0)
Monocytes Absolute: 0.5 10*3/uL (ref 0.1–1.0)
Monocytes Relative: 6.7 % (ref 3.0–12.0)
Neutro Abs: 4.5 10*3/uL (ref 1.4–7.7)
Platelets: 105 10*3/uL — ABNORMAL LOW (ref 150.0–400.0)
RBC: 4.87 Mil/uL (ref 4.22–5.81)
WBC: 7 10*3/uL (ref 4.5–10.5)

## 2012-03-12 LAB — POCT URINALYSIS DIPSTICK
Glucose, UA: NEGATIVE
Ketones, UA: NEGATIVE
Leukocytes, UA: NEGATIVE
Protein, UA: NEGATIVE
Spec Grav, UA: 1.025

## 2012-03-12 LAB — BASIC METABOLIC PANEL
BUN: 14 mg/dL (ref 6–23)
Chloride: 106 mEq/L (ref 96–112)
GFR: 128.2 mL/min (ref >60.00)
Potassium: 3.8 mEq/L (ref 3.5–5.1)
Sodium: 141 mEq/L (ref 135–145)

## 2012-03-19 ENCOUNTER — Encounter: Payer: BC Managed Care – PPO | Admitting: Internal Medicine

## 2012-05-02 ENCOUNTER — Encounter: Payer: Self-pay | Admitting: Internal Medicine

## 2012-05-02 ENCOUNTER — Ambulatory Visit (INDEPENDENT_AMBULATORY_CARE_PROVIDER_SITE_OTHER): Payer: BC Managed Care – PPO | Admitting: Internal Medicine

## 2012-05-02 VITALS — BP 112/70 | HR 76 | Temp 98.2°F | Wt 123.0 lb

## 2012-05-02 DIAGNOSIS — Z0000 Encounter for general adult medical examination without abnormal findings: Secondary | ICD-10-CM

## 2012-05-03 NOTE — Progress Notes (Signed)
Patient ID: Edward Alvarado, male   DOB: 05-07-1981, 31 y.o.   MRN: [id number]  CPX  See scanned forms for documentation related to care at domicillary Exam included  Well visit- health maint UTD

## 2012-06-28 ENCOUNTER — Telehealth: Payer: Self-pay | Admitting: Internal Medicine

## 2012-06-28 NOTE — Telephone Encounter (Signed)
Pts mom called and is filling out some forms. Need to know date of last tetanus shot pt rcvd. Also need to know date of last pneumonia vax.

## 2012-06-28 NOTE — Telephone Encounter (Signed)
Called mom and gave mom immunization information.

## 2012-08-14 ENCOUNTER — Ambulatory Visit: Payer: BC Managed Care – PPO | Admitting: Sports Medicine

## 2012-08-21 ENCOUNTER — Ambulatory Visit (INDEPENDENT_AMBULATORY_CARE_PROVIDER_SITE_OTHER): Payer: BC Managed Care – PPO | Admitting: Sports Medicine

## 2012-08-21 VITALS — BP 132/83 | HR 102 | Temp 97.8°F | Wt 125.5 lb

## 2012-08-21 DIAGNOSIS — H900 Conductive hearing loss, bilateral: Secondary | ICD-10-CM

## 2012-08-21 DIAGNOSIS — M7040 Prepatellar bursitis, unspecified knee: Secondary | ICD-10-CM

## 2012-08-21 DIAGNOSIS — J309 Allergic rhinitis, unspecified: Secondary | ICD-10-CM

## 2012-08-21 DIAGNOSIS — G809 Cerebral palsy, unspecified: Secondary | ICD-10-CM

## 2012-08-21 DIAGNOSIS — M4120 Other idiopathic scoliosis, site unspecified: Secondary | ICD-10-CM

## 2012-08-21 DIAGNOSIS — E785 Hyperlipidemia, unspecified: Secondary | ICD-10-CM

## 2012-08-21 DIAGNOSIS — H409 Unspecified glaucoma: Secondary | ICD-10-CM

## 2012-08-21 MED ORDER — ONDANSETRON 4 MG PO TBDP: 4.0000 mg | ORAL_TABLET | Freq: Three times a day (TID) | ORAL | Status: DC | PRN

## 2012-08-21 MED ORDER — LORATADINE 10 MG PO TABS: 10.0000 mg | ORAL_TABLET | Freq: Every day | ORAL | Status: DC

## 2012-08-21 MED ORDER — MENS MULTIVITAMIN PLUS PO TABS: 1.0000 | ORAL_TABLET | Freq: Every day | ORAL | Status: DC

## 2012-08-21 NOTE — Patient Instructions (Addendum)
It was nice to meet you.  Please follow up as needed or in 1 year.  I have referred you to Physical Therapy, they should contact you within 1 week; I have also provided a prescription for a motorized wheelchair.

## 2012-08-27 ENCOUNTER — Ambulatory Visit (INDEPENDENT_AMBULATORY_CARE_PROVIDER_SITE_OTHER): Payer: BC Managed Care – PPO | Admitting: *Deleted

## 2012-08-27 DIAGNOSIS — Z23 Encounter for immunization: Secondary | ICD-10-CM

## 2012-08-31 ENCOUNTER — Encounter: Payer: Self-pay | Admitting: Sports Medicine

## 2012-08-31 DIAGNOSIS — M7040 Prepatellar bursitis, unspecified knee: Secondary | ICD-10-CM | POA: Insufficient documentation

## 2012-08-31 NOTE — Assessment & Plan Note (Addendum)
Referal to PT and hand written Rx for Motorized wheelchair Otherwise stable, signed standing orders for facility.  Otherwise no changes to care plan.  No indication for labs.

## 2012-08-31 NOTE — Assessment & Plan Note (Signed)
Checked in May, diet controlled

## 2012-08-31 NOTE — Assessment & Plan Note (Signed)
Followed by ophthalmology. No changes

## 2012-08-31 NOTE — Assessment & Plan Note (Signed)
Well controlled with current meds.

## 2012-08-31 NOTE — Progress Notes (Signed)
  [facility]  Patient name: Edward Alvarado MRN [id number]  Date of birth: Dec 03, 1980  CC & HPI:  Edward Alvarado is a 31 y.o. male presenting today to establish care.  his past medical history is significant for:  #  Cerebral Palsy: Pt is s/p scoliosis surgery.  Current lives in group home.  Multiple Orthopedic Surgeries: @ [facility] in [place]. And Selective Dorsal Rhisotomy.  Current Orthopedist is Dr. Darra Lis.  Needs new evaluation for new Power Wheel Chair.   # Deaf: communicates with American Sign Language #  HLD:  Has been elevated in the past.  Currently diet controlled.   # Allergic Rhinitis: # Glaucoma:S/p multiple opthalmalogic surgeries.    He has been followed by Dr. Birdie Sons but needs Medicaid physician. ------------------------------------------------------------------------------------------------------------------ Medication Compliance: compliant all of the time  Diet Compliance: compliant most of the time  ------------------------------------------------------------------------------------------------------------------ Acute Concerns:  Wheel Chair Assessment ? Undescended testicle   ROS:  PER HPI  Pertinent History Reviewed:  Medical & Surgical Hx:  Reviewed & Updated - see associated sections in EMR. Medications: Reviewed & Updated - see associated section in EMR. Prior to Admission medications   Medication Sig Start Date End Date Taking? Authorizing Provider  bimatoprost (LUMIGAN) 0.03 % ophthalmic drops Place 1 drop into the left eye at bedtime.     Historical Provider, MD  brimonidine-timolol (COMBIGAN) 0.2-0.5 % ophthalmic solution Place 1 drop into the left eye every 12 (twelve) hours.     Historical Provider, MD  brinzolamide (AZOPT) 1 % ophthalmic suspension Place 1 drop into the left eye every 12 (twelve) hours.     Historical Provider, MD  loratadine (CLARITIN) 10 MG tablet Take 1 tablet (10 mg total) by mouth daily. 08/21/12    Andrena Mews, DO  Multiple Vitamin (MULTIVITAMIN) tablet Take 1 tablet by mouth daily.      Historical Provider, MD  Multiple Vitamins-Minerals (MENS MULTIVITAMIN PLUS) TABS Take 1 tablet by mouth daily. 08/21/12   Andrena Mews, DO  ondansetron (ZOFRAN ODT) 4 MG disintegrating tablet Take 1 tablet (4 mg total) by mouth every 8 (eight) hours as needed for nausea. 08/21/12   Andrena Mews, DO   Social History: Reviewed & Updated - see associated section in EMR.  Significant for  reports that he has never smoked. He has never used smokeless tobacco.  Objective Findings:  Vitals:  Filed Vitals:   08/21/12 0901  BP: 132/83  Pulse: 102  Temp: 97.8 F (36.6 C)   PE: GENERAL:  Adult CP male. In no discomfort; no respiratory distress.  In manual wheel chair PSYCH: Alert and interactive, uses sign language to communicate.  Unable to verbalize H&N: AT/[place], trachea midline EENT:  MMM, no scleral icterus, EOMi, normal TM, no nasal palor HEART: RRR, S1/S2 heard, no murmur LUNGS: CTA B, no wheezes, no crackles EXTREMITIES: spastic contractures however able to transfer with 2+ assist to exam table.   PELVIC:  Normal male genitalia, B testicles descended   Assessment & Plan:

## 2012-09-03 ENCOUNTER — Telehealth: Payer: Self-pay | Admitting: Sports Medicine

## 2012-09-03 NOTE — Telephone Encounter (Signed)
Spoke with patient's mother and informed her that I called [facility] neuro and was told that they will give her a call today. I also gave her the phone number there so if they do not call her she can call them. [phone]

## 2012-09-03 NOTE — Telephone Encounter (Signed)
Was supposed to be referred to PT and hasn't heard anything yet.  pls advise. °

## 2012-09-18 ENCOUNTER — Encounter (HOSPITAL_COMMUNITY): Payer: Self-pay | Admitting: *Deleted

## 2012-09-18 DIAGNOSIS — H409 Unspecified glaucoma: Secondary | ICD-10-CM | POA: Insufficient documentation

## 2012-09-18 DIAGNOSIS — E785 Hyperlipidemia, unspecified: Secondary | ICD-10-CM | POA: Insufficient documentation

## 2012-09-18 DIAGNOSIS — G809 Cerebral palsy, unspecified: Secondary | ICD-10-CM | POA: Insufficient documentation

## 2012-09-18 DIAGNOSIS — Z8739 Personal history of other diseases of the musculoskeletal system and connective tissue: Secondary | ICD-10-CM | POA: Insufficient documentation

## 2012-09-18 DIAGNOSIS — Z79899 Other long term (current) drug therapy: Secondary | ICD-10-CM | POA: Insufficient documentation

## 2012-09-18 DIAGNOSIS — M4120 Other idiopathic scoliosis, site unspecified: Secondary | ICD-10-CM | POA: Insufficient documentation

## 2012-09-18 DIAGNOSIS — N200 Calculus of kidney: Secondary | ICD-10-CM | POA: Insufficient documentation

## 2012-09-18 DIAGNOSIS — Z791 Long term (current) use of non-steroidal anti-inflammatories (NSAID): Secondary | ICD-10-CM | POA: Insufficient documentation

## 2012-09-18 DIAGNOSIS — H9190 Unspecified hearing loss, unspecified ear: Secondary | ICD-10-CM | POA: Insufficient documentation

## 2012-09-18 DIAGNOSIS — Q619 Cystic kidney disease, unspecified: Secondary | ICD-10-CM | POA: Insufficient documentation

## 2012-09-18 LAB — URINALYSIS, ROUTINE W REFLEX MICROSCOPIC
Glucose, UA: NEGATIVE mg/dL
Ketones, ur: NEGATIVE mg/dL
Leukocytes, UA: NEGATIVE
Nitrite: NEGATIVE
Protein, ur: NEGATIVE mg/dL
pH: 6 (ref 5.0–8.0)

## 2012-09-18 LAB — CBC WITH DIFFERENTIAL/PLATELET
Basophils Absolute: 0 10*3/uL (ref 0.0–0.1)
Basophils Relative: 0 % (ref 0–1)
Lymphocytes Relative: 17 % (ref 12–46)
MCHC: 35.3 g/dL (ref 30.0–36.0)
Neutro Abs: 8.2 10*3/uL — ABNORMAL HIGH (ref 1.7–7.7)
Neutrophils Relative %: 69 % (ref 43–77)
RDW: 12.6 % (ref 11.5–15.5)
WBC: 11.9 10*3/uL — ABNORMAL HIGH (ref 4.0–10.5)

## 2012-09-18 LAB — URINE MICROSCOPIC-ADD ON

## 2012-09-18 NOTE — ED Notes (Signed)
Pt mom states that left side abdominal pain since yesterday. Pt vomited throughout the night and then the pain went away and tonight the pain came back.

## 2012-09-19 ENCOUNTER — Other Ambulatory Visit: Payer: Self-pay | Admitting: Urology

## 2012-09-19 ENCOUNTER — Encounter (HOSPITAL_COMMUNITY): Payer: Self-pay | Admitting: Radiology

## 2012-09-19 ENCOUNTER — Emergency Department (HOSPITAL_COMMUNITY)
Admission: EM | Admit: 2012-09-19 | Discharge: 2012-09-19 | Disposition: A | Discharge status: Home / Self Care | Payer: BC Managed Care – PPO | Attending: Emergency Medicine | Admitting: Emergency Medicine

## 2012-09-19 ENCOUNTER — Emergency Department (HOSPITAL_COMMUNITY): Payer: BC Managed Care – PPO

## 2012-09-19 DIAGNOSIS — N200 Calculus of kidney: Secondary | ICD-10-CM

## 2012-09-19 DIAGNOSIS — N281 Cyst of kidney, acquired: Secondary | ICD-10-CM

## 2012-09-19 LAB — COMPREHENSIVE METABOLIC PANEL
ALT: 17 U/L (ref 0–53)
AST: 19 U/L (ref 0–37)
Albumin: 4.1 g/dL (ref 3.5–5.2)
Alkaline Phosphatase: 88 U/L (ref 39–117)
CO2: 26 mEq/L (ref 19–32)
Chloride: 103 mEq/L (ref 96–112)
Potassium: 4 mEq/L (ref 3.5–5.1)
Sodium: 140 mEq/L (ref 135–145)
Total Bilirubin: 0.7 mg/dL (ref 0.3–1.2)

## 2012-09-19 MED ORDER — KETOROLAC TROMETHAMINE 30 MG/ML IJ SOLN
30.0000 mg | Freq: Once | INTRAMUSCULAR | Status: AC
  Administered 2012-09-19: 30 mg via INTRAVENOUS
  Filled 2012-09-19: qty 1

## 2012-09-19 MED ORDER — ONDANSETRON 4 MG PO TBDP: 4.0000 mg | ORAL_TABLET | Freq: Three times a day (TID) | ORAL | Status: DC | PRN

## 2012-09-19 MED ORDER — PROMETHAZINE HCL 25 MG PO TABS: 25.0000 mg | ORAL_TABLET | Freq: Four times a day (QID) | ORAL | Status: DC | PRN

## 2012-09-19 MED ORDER — HYDROCODONE-ACETAMINOPHEN 5-500 MG PO TABS: 1.0000 | ORAL_TABLET | Freq: Four times a day (QID) | ORAL | Status: DC | PRN

## 2012-09-19 MED ORDER — NAPROXEN 500 MG PO TABS: 500.0000 mg | ORAL_TABLET | Freq: Two times a day (BID) | ORAL | Status: DC

## 2012-09-19 MED ORDER — TAMSULOSIN HCL 0.4 MG PO CAPS: 0.4000 mg | ORAL_CAPSULE | Freq: Two times a day (BID) | ORAL | Status: DC

## 2012-09-19 NOTE — ED Notes (Signed)
Mother states pt lives in a group home and has been complaining of abdominal pain since yesterday. Pain subsided and then worsened  at bed time.

## 2012-09-19 NOTE — ED Provider Notes (Signed)
History     CSN: [id number]  Arrival date & time 09/18/12  2314   First MD Initiated Contact with Patient 09/19/12 0103      Chief Complaint  Patient presents with  . Abdominal Pain    (Consider location/radiation/quality/duration/timing/severity/associated sxs/prior treatment) HPI Comments: 31 year old male with cerebral palsy presents with several days of intermittent left lower quadrant pain. The patient is nonverbal, he does interact with occasional sign language with his mother who states that he has had one episode of vomiting, complains of pain intermittently throughout the last several days but has not had fevers, diarrhea, rashes or swelling. He has no history of abdominal pain or surgery but there is a family history of kidney stones in both the mother and the father.  Patient is a 31 y.o. male presenting with abdominal pain. The history is provided by the patient and a parent. History Limited By: Level V caveat apply secondary to patient's inability to speak secondary to cerebral palsy.  Abdominal Pain The primary symptoms of the illness include abdominal pain.    Past Medical History  Diagnosis Date  . Allergy   . Cerebral palsy   . Scoliosis   . Deafness     hearing aids  . Glaucoma   . Hyperlipidemia   . Platelets decreased   . Prepatellar bursitis     Hospitalized in 2012 for sepsis secondary to bursisiitis    Past Surgical History  Procedure Date  . Multiple orthopedic procedures   . Myringotomy   . Eye surgery 2012    [facility]    Family History  Problem Relation Age of Onset  . Alzheimer's disease Paternal Grandmother   . Asthma Mother   . Asthma Maternal Grandmother   . Kidney Stones Mother   . Osteoporosis Mother   . Stroke Paternal Grandmother   . Stroke Paternal Grandfather     History  Substance Use Topics  . Smoking status: Never Smoker   . Smokeless tobacco: Never Used  . Alcohol Use: No      Review of Systems  Unable to perform  ROS: Other  Gastrointestinal: Positive for abdominal pain.    Allergies  Betadine and Iodine  Home Medications   Current Outpatient Rx  Name  Route  Sig  Dispense  Refill  . BRIMONIDINE TARTRATE-TIMOLOL 0.2-0.5 % OP SOLN   Left Eye   Place 1 drop into the left eye every 12 (twelve) hours.          Marland Kitchen LORATADINE 10 MG PO TABS   Oral   Take 1 tablet (10 mg total) by mouth daily.   90 tablet   3   . ONE-DAILY MULTI VITAMINS PO TABS   Oral   Take 1 tablet by mouth daily.           Marland Kitchen HYDROCODONE-ACETAMINOPHEN 5-500 MG PO TABS   Oral   Take 1-2 tablets by mouth every 6 (six) hours as needed for pain.   15 tablet   0   . NAPROXEN 500 MG PO TABS   Oral   Take 1 tablet (500 mg total) by mouth 2 (two) times daily with a meal.   30 tablet   0   . ONDANSETRON 4 MG PO TBDP   Oral   Take 1 tablet (4 mg total) by mouth every 8 (eight) hours as needed for nausea.   10 tablet   0   . PROMETHAZINE HCL 25 MG PO TABS   Oral  Take 1 tablet (25 mg total) by mouth every 6 (six) hours as needed for nausea.   12 tablet   0   . TAMSULOSIN HCL 0.4 MG PO CAPS   Oral   Take 1 capsule (0.4 mg total) by mouth 2 (two) times daily.   10 capsule   0     BP 127/81  Pulse 88  Temp 99 F (37.2 C) (Oral)  Resp 16  SpO2 97%  Physical Exam  Nursing note and vitals reviewed. Constitutional: He appears well-developed and well-nourished. No distress.  HENT:  Head: Normocephalic and atraumatic.  Mouth/Throat: Oropharynx is clear and moist. No oropharyngeal exudate.  Eyes: Conjunctivae normal and EOM are normal. Pupils are equal, round, and reactive to light. Right eye exhibits no discharge. Left eye exhibits no discharge. No scleral icterus.  Neck: Normal range of motion. Neck supple. No JVD present. No thyromegaly present.  Cardiovascular: Normal rate, regular rhythm, normal heart sounds and intact distal pulses.  Exam reveals no gallop and no friction rub.   No murmur  heard. Pulmonary/Chest: Effort normal and breath sounds normal. No respiratory distress. He has no wheezes. He has no rales.  Abdominal: Soft. Bowel sounds are normal. He exhibits no distension and no mass. There is no tenderness.  Musculoskeletal: Normal range of motion. He exhibits no edema and no tenderness.  Lymphadenopathy:    He has no cervical adenopathy.  Neurological: He is alert. Coordination normal.  Skin: Skin is warm and dry. No rash noted. No erythema.  Psychiatric: He has a normal mood and affect. His behavior is normal.    ED Course  Procedures (including critical care time)  Labs Reviewed  URINALYSIS, ROUTINE W REFLEX MICROSCOPIC - Abnormal; Notable for the following:    Hgb urine dipstick LARGE (*)     All other components within normal limits  CBC WITH DIFFERENTIAL - Abnormal; Notable for the following:    WBC 11.9 (*)     Platelets 133 (*)     Neutro Abs 8.2 (*)     Monocytes Absolute 1.1 (*)     All other components within normal limits  COMPREHENSIVE METABOLIC PANEL - Abnormal; Notable for the following:    Glucose, Bld 105 (*)     All other components within normal limits  URINE MICROSCOPIC-ADD ON - Abnormal; Notable for the following:    Crystals CA OXALATE CRYSTALS (*)     All other components within normal limits   Ct Abdomen Pelvis Wo Contrast  09/19/2012  *RADIOLOGY REPORT*  Clinical Data: Left-sided abdominal pain and vomiting.  CT ABDOMEN AND PELVIS WITHOUT CONTRAST  Technique:  Multidetector CT imaging of the abdomen and pelvis was performed following the standard protocol without intravenous contrast.  Comparison: CT of the abdomen and pelvis performed 11/29/2008  Findings: The visualized lung bases are clear.  The liver and spleen are unremarkable in appearance.  The gallbladder is within normal limits.  The pancreas and adrenal glands are unremarkable.  There is very mild left-sided hydronephrosis, with prominence of the proximal right ureter to the  level of an obstructing 7 x 5 mm stone, 3 cm below the left renal pelvis.  Associated left-sided perinephric stranding is noted.  A 2.8 cm left renal cyst has increased in size from the prior study, but appears grossly benign.  The right kidney is unremarkable in appearance.  No nonobstructing renal stones are identified.  No free fluid is identified.  The small bowel is unremarkable in appearance.  The stomach is within normal limits.  No acute vascular abnormalities are seen.  The appendix is normal in caliber and contains air, without evidence for appendicitis.  The colon is unremarkable in appearance.  The bladder is mildly distended and grossly unremarkable.  The prostate is normal in size.  No inguinal lymphadenopathy is seen.  No acute osseous abnormalities are identified.  IMPRESSION:  1.  Very mild left-sided hydronephrosis, with an obstructing 7 x 5 mm stone in the proximal left ureter, 3 cm below the left renal pelvis. 2.  Interval increase in size of 2.8 cm left renal cyst.   Original Report Authenticated By: Tonia Ghent, M.D.      1. Kidney stone   2. Renal cyst       MDM  At this time the patient does not have any abdominal tenderness and does not appear to be in distress, he communicates with his mother that he is only having a very small amount of pain at this time. Due to fluctuating nature of this pain which seems to be colicky associated with the hematuria and crystals on exam we suspected he has nephrolithiasis, signs normal, Toradol ordered, CT scan pending   The patient has had minimal symptoms since arrival, the CT scan was read as having bilateral minimally obstructive kidney stones, the patient appears stable followup in outpatient setting with the urologist, medications given for home. The patient and family were informed of the renal cyst.     Vida Roller, MD 09/19/12 [phone]

## 2012-09-20 ENCOUNTER — Encounter (HOSPITAL_COMMUNITY): Payer: Self-pay | Admitting: *Deleted

## 2012-09-20 NOTE — Progress Notes (Addendum)
SAME DAY SURGERY INSTRUCTIONS DISCUSSED WITH PT'S MOTHER [name] Jozwiak BY PHONE--PT IS DEAF AND W/C BOUND-HAS CEREBRAL PALSY.  PT'S PARENTS WILL BE WITH HIM DAY OF SURGERY--PT'S MOTHER DOES NOT WANT SIGN LANGUAGE INTERPRETER ARRANGED THRU [facility]--SHE WILL SIGN FOR HER SON AND GO WITH HIM TO OR TO TALK WITH ANESTHESIOLOGIST PREOP.  INSTRUCTIONS WERE ALSO GIVEN FOR BETASEPT (HIBICLENS) SHOWERS. COPY OF PT'S ANESTHESIA RECORDS FROM [facility] 01/10/2011 AND 12/13/2010 (PROBLEMS WITH LMA IN JAN 2012 SURGERY ) ARE ON PT'S CHART AND COPY GIVEN TO ANESTHESIOLOGIST FOR REVIEW. PT'S MOTHER FAXED [employer] PAPER SHOWING THAT SHE AND HER HUSBAND ARE PT'S LEGAL GUARDIANS--FORM ON PT'S CHART.

## 2012-09-24 ENCOUNTER — Ambulatory Visit: Payer: BC Managed Care – PPO | Attending: Sports Medicine | Admitting: Physical Therapy

## 2012-09-24 DIAGNOSIS — IMO0001 Reserved for inherently not codable concepts without codable children: Secondary | ICD-10-CM | POA: Insufficient documentation

## 2012-09-24 DIAGNOSIS — R293 Abnormal posture: Secondary | ICD-10-CM | POA: Insufficient documentation

## 2012-09-27 ENCOUNTER — Encounter (HOSPITAL_COMMUNITY): Payer: Self-pay | Admitting: Pharmacy Technician

## 2012-09-28 NOTE — H&P (Signed)
Chief Complaint  Kidney stone   History of Present Illness  Edward Alvarado is a pleasant 31 year old gentleman seen at the request today of Dr. Eber Hong for a left ureteral stone. He developed severe left-sided flank pain associated with nausea and vomiting yesterday and presents to the emergency room last night for evaluation. His urinalysis demonstrated microscopic hematuria and calcium oxalate crystals without evidence of infection. A CT scan was performed with oral contrast. Notably, he does have an IV contrast allergy. This demonstrated a 7 x 5 mm left proximal ureteral stone measuring approximately 540 Hounsfield units. He did not appear to have any other renal calculi or ureteral calculi. He has not had fever. He has no prior history of kidney stones although does have a family history of kidney stones with both parents having had prior episodes of urolithiasis. Currently, his pain is well-controlled with naproxen and narcotic pain medication. He has been prescribed tamsulosin and his family does have a urine strainer.   Past Medical History Problems  1. History of  Cerebral Palsy 343.9 2. History of  Glaucoma 365.9  Surgical History Problems  1. History of  Cataract Surgery 2. History of  Cataract Surgery 3. History of  Hip Surgery 4. History of  Tonsillectomy With Adenoidectomy  Current Meds 1. Claritin 10 MG Oral Tablet; Therapy: (Recorded:06Nov2013) to 2. Combigan 0.2-0.5 % Ophthalmic Solution; Therapy: (Recorded:06Nov2013) to 3. Multivitamins CAPS; Therapy: (Recorded:06Nov2013) to  Allergies Medication  1. Iodine SOLN  Family History Problems  1. Family history of  Nephrolithiasis  Social History Problems    Marital History - Single   Never A Smoker   Physical Disability Affecting Ability To Work Denied    History of  Alcohol Use  Review of Systems Constitutional, skin, eye, otolaryngeal, hematologic/lymphatic, cardiovascular, pulmonary, endocrine, musculoskeletal,  gastrointestinal, neurological and psychiatric system(s) were reviewed and pertinent findings if present are noted.  Gastrointestinal: nausea and vomiting.  Constitutional: recent weight loss, but no fever.  Integumentary: skin rash/lesion.  ENT: sinus problems.  Respiratory: cough.    Vitals Vital Signs [Data Includes: Last 1 Day]  06Nov2013 11:08AM  BMI Calculated: 18.51 BSA Calculated: 1.69 Height: 5 ft 9 in Weight: 125 lb  Blood Pressure: 113 / 76 Temperature: 98 F Heart Rate: 84  Physical Exam Constitutional: Well nourished and well developed . No acute distress.  ENT:. The ears and nose are normal in appearance.  Neck: The appearance of the neck is normal and no neck mass is present.  Pulmonary: No respiratory distress, normal respiratory rhythm and effort and clear bilateral breath sounds . Breathing requires use of accessory muscles.  Cardiovascular: Heart rate and rhythm are normal . No peripheral edema.  Abdomen: The abdomen is soft and nontender. No masses are palpated. No CVA tenderness. No hernias are palpable. No hepatosplenomegaly noted.  Skin: Normal skin turgor, no visible rash and no visible skin lesions.  Neuro/Psych:. Mood and affect are appropriate.    Results/Data  I independently reviewed his medical records, laboratory studies, and imaging studies from his emergency room visit. Specifically, his urinalysis demonstrated 11-20 red blood cells with calcium oxalate crystals present. No bacteria was present. Serum creatinine is 1.05. CT scan findings are as dictated above in the medical history.     Assessment Assessed  1. Ureteral Stone 592.1  Discussion/Summary  1. 7 mm proximal left ureteral calculus: I have discussed options for treatment/management with Edward Alvarado's parents today who are his legal guardians. Based on the size of the stone, they understand  that while medical expulsion therapy as an option that the likelihood of success is fairly low. That being  said, he has been provided additional tamsulosin and will strain his urine. Tentatively, we did discuss scheduling definitive surgical treatment of the stone assuming that he does not pass the stone over the next couple weeks. We reviewed treatment options including percutaneous nephrolithotomy, ureteroscopic laser lithotripsy, and shockwave lithotripsy and reviewed the pros and cons of each approach. Considering the fact he likely will necessitate general anesthesia for any procedure including shockwave lithotripsy in considering a desire to treat the stone most definitively, we have decided to proceed with ureteroscopic laser lithotripsy. The potential risks and complications as well as alternative options and expected recovery process have been reviewed in detail today. This will be tentatively scheduled. I've instructed his parents to notify me should he develop high fever, uncontrolled pain, or persistent nausea/vomiting prior to his planned procedure. They also will notify me should he pass his stone in the meantime.  He will be scheduled for cystoscopy, left retrograde pyelography, left ureteroscopic laser lithotripsy, and left ureteral stent placement. It has been noted that he has had difficulty with anesthesia in the past. On discussion with the patient's parents, it is believed that he had poor oxygenation with an LMA and subsequently has been intubated with good results on his last few surgeries.  Cc: Dr. Eber Hong Dr. Gaspar Bidding     Signatures Electronically signed by : Heloise Purpura, M.D.; Sep 19 2012 12:29PM

## 2012-10-01 ENCOUNTER — Encounter (HOSPITAL_COMMUNITY): Payer: Self-pay | Admitting: *Deleted

## 2012-10-01 ENCOUNTER — Ambulatory Visit (HOSPITAL_COMMUNITY)
Admission: RE | Admit: 2012-10-01 | Discharge: 2012-10-01 | Disposition: A | Discharge status: Home / Self Care | Payer: BC Managed Care – PPO | Source: Ambulatory Visit | Attending: Urology | Admitting: Urology

## 2012-10-01 ENCOUNTER — Encounter (HOSPITAL_COMMUNITY): Payer: Self-pay | Admitting: Anesthesiology

## 2012-10-01 ENCOUNTER — Ambulatory Visit (HOSPITAL_COMMUNITY): Payer: BC Managed Care – PPO | Admitting: Anesthesiology

## 2012-10-01 ENCOUNTER — Encounter (HOSPITAL_COMMUNITY)
Admission: RE | Disposition: A | Discharge status: Home / Self Care | Payer: Self-pay | Source: Ambulatory Visit | Attending: Urology

## 2012-10-01 DIAGNOSIS — H409 Unspecified glaucoma: Secondary | ICD-10-CM | POA: Insufficient documentation

## 2012-10-01 DIAGNOSIS — N201 Calculus of ureter: Secondary | ICD-10-CM | POA: Insufficient documentation

## 2012-10-01 DIAGNOSIS — G809 Cerebral palsy, unspecified: Secondary | ICD-10-CM | POA: Insufficient documentation

## 2012-10-01 DIAGNOSIS — Z79899 Other long term (current) drug therapy: Secondary | ICD-10-CM | POA: Insufficient documentation

## 2012-10-01 HISTORY — DX: Adverse effect of unspecified anesthetic, initial encounter: T41.45XA

## 2012-10-01 HISTORY — PX: CYSTOSCOPY/RETROGRADE/URETEROSCOPY: SHX5316

## 2012-10-01 HISTORY — DX: Calculus of kidney: N20.0

## 2012-10-01 HISTORY — PX: HOLMIUM LASER APPLICATION: SHX5852

## 2012-10-01 HISTORY — DX: Other complications of anesthesia, initial encounter: T88.59XA

## 2012-10-01 LAB — BASIC METABOLIC PANEL
BUN: 21 mg/dL (ref 6–23)
CO2: 27 mEq/L (ref 19–32)
Chloride: 104 mEq/L (ref 96–112)
Creatinine, Ser: 0.79 mg/dL (ref 0.50–1.35)

## 2012-10-01 SURGERY — CYSTOSCOPY/RETROGRADE/URETEROSCOPY
Anesthesia: General | Laterality: Left | Wound class: Clean Contaminated

## 2012-10-01 MED ORDER — OXYCODONE HCL 5 MG PO TABS: 5.0000 mg | ORAL_TABLET | Freq: Once | ORAL | Status: DC | PRN

## 2012-10-01 MED ORDER — OXYCODONE HCL 5 MG/5ML PO SOLN
5.0000 mg | Freq: Once | ORAL | Status: DC | PRN
  Filled 2012-10-01: qty 5

## 2012-10-01 MED ORDER — MUPIROCIN 2 % EX OINT
TOPICAL_OINTMENT | Freq: Two times a day (BID) | CUTANEOUS | Status: DC
  Administered 2012-10-01: 1 via NASAL
  Filled 2012-10-01: qty 22

## 2012-10-01 MED ORDER — HYDROMORPHONE HCL PF 1 MG/ML IJ SOLN: 0.2500 mg | INTRAMUSCULAR | Status: DC | PRN

## 2012-10-01 MED ORDER — LEVOFLOXACIN 500 MG PO TABS: 500.0000 mg | ORAL_TABLET | Freq: Every day | ORAL | Status: DC

## 2012-10-01 MED ORDER — MIDAZOLAM HCL 5 MG/5ML IJ SOLN
INTRAMUSCULAR | Status: DC | PRN
  Administered 2012-10-01: 0.5 mg via INTRAVENOUS

## 2012-10-01 MED ORDER — IOHEXOL 300 MG/ML  SOLN
INTRAMUSCULAR | Status: AC
  Filled 2012-10-01: qty 1

## 2012-10-01 MED ORDER — SODIUM CHLORIDE 0.9 % IR SOLN
Status: DC | PRN
  Administered 2012-10-01: 3000 mL via INTRAVESICAL

## 2012-10-01 MED ORDER — EPHEDRINE SULFATE 50 MG/ML IJ SOLN
INTRAMUSCULAR | Status: DC | PRN
  Administered 2012-10-01: 5 mg via INTRAVENOUS

## 2012-10-01 MED ORDER — CIPROFLOXACIN IN D5W 400 MG/200ML IV SOLN
400.0000 mg | INTRAVENOUS | Status: AC
  Administered 2012-10-01: 400 mg via INTRAVENOUS

## 2012-10-01 MED ORDER — ACETAMINOPHEN 10 MG/ML IV SOLN: 1000.0000 mg | Freq: Once | INTRAVENOUS | Status: DC | PRN

## 2012-10-01 MED ORDER — FENTANYL CITRATE 0.05 MG/ML IJ SOLN
INTRAMUSCULAR | Status: DC | PRN
  Administered 2012-10-01: 50 ug via INTRAVENOUS

## 2012-10-01 MED ORDER — MEPERIDINE HCL 50 MG/ML IJ SOLN: 6.2500 mg | INTRAMUSCULAR | Status: DC | PRN

## 2012-10-01 MED ORDER — PROMETHAZINE HCL 25 MG/ML IJ SOLN: 6.2500 mg | INTRAMUSCULAR | Status: DC | PRN

## 2012-10-01 MED ORDER — LACTATED RINGERS IV SOLN
INTRAVENOUS | Status: DC
  Administered 2012-10-01: 1000 mL via INTRAVENOUS
  Administered 2012-10-01: 14:00:00 via INTRAVENOUS

## 2012-10-01 MED ORDER — 0.9 % SODIUM CHLORIDE (POUR BTL) OPTIME
TOPICAL | Status: DC | PRN
  Administered 2012-10-01: 1000 mL

## 2012-10-01 MED ORDER — ONDANSETRON HCL 4 MG/2ML IJ SOLN
INTRAMUSCULAR | Status: DC | PRN
  Administered 2012-10-01 (×2): 1 mg via INTRAVENOUS

## 2012-10-01 MED ORDER — LIDOCAINE HCL (CARDIAC) 20 MG/ML IV SOLN
INTRAVENOUS | Status: DC | PRN
  Administered 2012-10-01: 50 mg via INTRAVENOUS

## 2012-10-01 MED ORDER — CIPROFLOXACIN IN D5W 400 MG/200ML IV SOLN
INTRAVENOUS | Status: AC
  Filled 2012-10-01: qty 200

## 2012-10-01 MED ORDER — PROPOFOL 10 MG/ML IV EMUL
INTRAVENOUS | Status: DC | PRN
  Administered 2012-10-01: 150 mg via INTRAVENOUS

## 2012-10-01 MED ORDER — ACETAMINOPHEN 10 MG/ML IV SOLN
INTRAVENOUS | Status: DC | PRN
  Administered 2012-10-01: 1000 mg via INTRAVENOUS

## 2012-10-01 MED ORDER — ACETAMINOPHEN 10 MG/ML IV SOLN
INTRAVENOUS | Status: AC
  Filled 2012-10-01: qty 100

## 2012-10-01 MED ORDER — IOHEXOL 300 MG/ML  SOLN
INTRAMUSCULAR | Status: DC | PRN
  Administered 2012-10-01: 3 mL

## 2012-10-01 SURGICAL SUPPLY — 16 items
ADAPTER CATH URET PLST 4-6FR (CATHETERS) ×2 IMPLANT
ADPR CATH URET STRL DISP 4-6FR (CATHETERS) ×1
BAG URO CATCHER STRL LF (DRAPE) ×2 IMPLANT
BASKET ZERO TIP NITINOL 2.4FR (BASKET) ×1 IMPLANT
BSKT STON RTRVL ZERO TP 2.4FR (BASKET) ×1
CATH INTERMIT  6FR 70CM (CATHETERS) ×2 IMPLANT
CLOTH BEACON ORANGE TIMEOUT ST (SAFETY) ×2 IMPLANT
DRAPE CAMERA CLOSED 9X96 (DRAPES) ×2 IMPLANT
GLOVE BIOGEL M STRL SZ7.5 (GLOVE) ×2 IMPLANT
GOWN PREVENTION PLUS XLARGE (GOWN DISPOSABLE) ×2 IMPLANT
GOWN STRL NON-REIN LRG LVL3 (GOWN DISPOSABLE) ×4 IMPLANT
GUIDEWIRE ANG ZIPWIRE 038X150 (WIRE) IMPLANT
GUIDEWIRE STR DUAL SENSOR (WIRE) ×2 IMPLANT
MANIFOLD NEPTUNE II (INSTRUMENTS) ×2 IMPLANT
PACK CYSTO (CUSTOM PROCEDURE TRAY) ×2 IMPLANT
TUBING CONNECTING 10 (TUBING) ×2 IMPLANT

## 2012-10-01 NOTE — Anesthesia Postprocedure Evaluation (Signed)
Anesthesia Post Note  Patient: Edward Alvarado  Procedure(s) Performed: Procedure(s) (LRB): CYSTOSCOPY/RETROGRADE/URETEROSCOPY (Left) HOLMIUM LASER APPLICATION (Left)  Anesthesia type: General  Patient location: PACU  Post pain: Pain level controlled  Post assessment: Post-op Vital signs reviewed  Last Vitals: BP 125/78  Pulse 93  Temp 36.2 C (Oral)  Resp 15  SpO2 100%  Post vital signs: Reviewed  Level of consciousness: sedated  Complications: No apparent anesthesia complications

## 2012-10-01 NOTE — Interval H&P Note (Signed)
History and Physical Interval Note:  10/01/2012 12:45 PM  Edward Alvarado  has presented today for surgery, with the diagnosis of Left Ureteral Calculus  The various methods of treatment have been discussed with the patient and family. After consideration of risks, benefits and other options for treatment, the patient has consented to  Procedure(s) (LRB) with comments: CYSTOSCOPY/RETROGRADE/URETEROSCOPY (Left) - C-ARM  HOLMIUM LASER APPLICATION (Left) CYSTOSCOPY WITH STENT PLACEMENT (Left) - LEFT URETERAL STENT PLACEMENT as a surgical intervention .  The patient's history has been reviewed, patient examined, no change in status, stable for surgery.  I have reviewed the patient's chart and labs.  Questions were answered to the patient's satisfaction.     Estephan Gallardo,LES

## 2012-10-01 NOTE — Anesthesia Preprocedure Evaluation (Addendum)
Anesthesia Evaluation  Patient identified by MRN, date of birth, ID band Patient awake    Reviewed: Allergy & Precautions, H&P , NPO status , Patient's Chart, lab work & pertinent test results  History of Anesthesia Complications Negative for: history of anesthetic complications  Airway Mallampati: III TM Distance: >3 FB Neck ROM: Full    Dental  (+) Teeth Intact and Dental Advisory Given   Pulmonary neg pulmonary ROS,  breath sounds clear to auscultation  Pulmonary exam normal       Cardiovascular - CAD and - Past MI Rhythm:Regular Rate:Normal     Neuro/Psych Cerebral palsy. Sensation intact in LE but no motor.  negative psych ROS   GI/Hepatic negative GI ROS, Neg liver ROS,   Endo/Other  negative endocrine ROS  Renal/GU negative Renal ROS     Musculoskeletal negative musculoskeletal ROS (+)   Abdominal   Peds  Hematology negative hematology ROS (+)   Anesthesia Other Findings   Reproductive/Obstetrics                         Anesthesia Physical Anesthesia Plan  ASA: III  Anesthesia Plan: General   Post-op Pain Management:    Induction: Intravenous  Airway Management Planned: LMA  Additional Equipment:   Intra-op Plan:   Post-operative Plan: Extubation in OR  Informed Consent: I have reviewed the patients History and Physical, chart, labs and discussed the procedure including the risks, benefits and alternatives for the proposed anesthesia with the patient or authorized representative who has indicated his/her understanding and acceptance.   Dental advisory given  Plan Discussed with: CRNA  Anesthesia Plan Comments:         Anesthesia Quick Evaluation

## 2012-10-01 NOTE — Op Note (Signed)
Preoperative diagnosis: Left ureteral calculus  Postoperative diagnosis: Left ureteral calculus  Procedure:  1. Cystoscopy 2. Left ureteroscopy and stone removal 3. Ureteroscopic laser lithotripsy 4. Left retrograde pyelography with interpretation  Surgeon: Moody Bruins. M.D.  Anesthesia: General  Complications: None  Intraoperative findings: Left retrograde pyelography demonstrated a filling defect within the left ureter consistent with the patient's known calculus without other abnormalities.  EBL: Minimal  Specimens: 1. Left ureteral calculus  Disposition of specimens: [facility] for stone analysis  Indication: Edward Alvarado is a 31 y.o. year old patient with urolithiasis. After reviewing the management options for treatment, the patient elected to proceed with the above surgical procedure(s). We have discussed the potential benefits and risks of the procedure, side effects of the proposed treatment, the likelihood of the patient achieving the goals of the procedure, and any potential problems that might occur during the procedure or recuperation. Informed consent has been obtained.  Description of procedure:  The patient was taken to the operating room and general anesthesia was induced.  The patient was placed in the dorsal lithotomy position, prepped and draped in the usual sterile fashion, and preoperative antibiotics were administered. A preoperative time-out was performed.   Cystourethroscopy was performed.  The patient's urethra was examined and was normal. The bladder was then systematically examined in its entirety. There was no evidence for any bladder tumors, stones, or other mucosal pathology.    Attention then turned to the left ureteral orifice and a ureteral catheter was used to intubate the ureteral orifice.  Omnipaque contrast was injected through the ureteral catheter and a retrograde pyelogram was performed with findings as dictated  above.  A 0.38 sensor guidewire was then advanced up the left ureter into the renal pelvis under fluoroscopic guidance. The 6 Fr semirigid ureteroscope was then advanced into the ureter next to the guidewire and the calculus was identified.   The stone was then fragmented with the 365 micron holmium laser fiber on a setting of 0.6 J and frequency of 6 Hz.   All stones were then removed from the ureter with a zero tip nitinol basket.  Reinspection of the ureter revealed no remaining visible stones or fragments.   The wire was then backloaded through the cystoscope and a ureteral stent was advance over the wire using Seldinger technique.  The stent was positioned appropriately under fluoroscopic and cystoscopic guidance.  The wire was then removed with an adequate stent curl noted in the renal pelvis as well as in the bladder.  The bladder was then emptied and the procedure ended.  The patient appeared to tolerate the procedure well and without complications.  The patient was able to be awakened and transferred to the recovery unit in satisfactory condition.

## 2012-10-01 NOTE — Preoperative (Signed)
Beta Blockers   Reason not to administer Beta Blockers:Not Applicable 

## 2012-10-01 NOTE — Transfer of Care (Signed)
Immediate Anesthesia Transfer of Care Note  Patient: Edward Alvarado  Procedure(s) Performed: Procedure(s) (LRB) with comments: CYSTOSCOPY/RETROGRADE/URETEROSCOPY (Left) - laser lithotripsy stone extraction on left HOLMIUM LASER APPLICATION (Left)  Patient Location: PACU  Anesthesia Type:General  Level of Consciousness: awake and patient cooperative  Airway & Oxygen Therapy: Patient Spontanous Breathing and Patient connected to face mask oxygen  Post-op Assessment: Report given to PACU RN, Post -op Vital signs reviewed and stable and Patient moving all extremities  Post vital signs: Reviewed and stable  Complications: No apparent anesthesia complications

## 2012-10-02 ENCOUNTER — Encounter (HOSPITAL_COMMUNITY): Payer: Self-pay | Admitting: Urology

## 2013-05-08 ENCOUNTER — Encounter: Payer: Self-pay | Admitting: Sports Medicine

## 2013-05-08 ENCOUNTER — Ambulatory Visit (INDEPENDENT_AMBULATORY_CARE_PROVIDER_SITE_OTHER): Payer: BC Managed Care – PPO | Admitting: Sports Medicine

## 2013-05-08 VITALS — BP 116/87 | HR 91 | Temp 98.0°F | Wt 134.5 lb

## 2013-05-08 DIAGNOSIS — L719 Rosacea, unspecified: Secondary | ICD-10-CM

## 2013-05-08 DIAGNOSIS — G809 Cerebral palsy, unspecified: Secondary | ICD-10-CM

## 2013-05-08 DIAGNOSIS — L710 Perioral dermatitis: Secondary | ICD-10-CM | POA: Insufficient documentation

## 2013-05-08 DIAGNOSIS — H409 Unspecified glaucoma: Secondary | ICD-10-CM

## 2013-05-08 MED ORDER — BRIMONIDINE TARTRATE-TIMOLOL 0.2-0.5 % OP SOLN: 1.0000 [drp] | Freq: Two times a day (BID) | OPHTHALMIC | Status: DC

## 2013-05-08 NOTE — Assessment & Plan Note (Signed)
Encouraged to change shaving lotions Use moisturizing cream

## 2013-05-08 NOTE — Assessment & Plan Note (Signed)
Now requiring drops in both eyes

## 2013-05-08 NOTE — Assessment & Plan Note (Signed)
FL2  completed today 

## 2013-05-08 NOTE — Progress Notes (Signed)
  [facility]  Patient name: Edward Alvarado MRN [id number]  Date of birth: 06/20/1981  CC & HPI:  Edward Alvarado is a 32 y.o. male presenting today for annual CPE.  his past medical history is significant for:  #  Cerebral Palsy: Pt is s/p scoliosis surgery.  Current lives in group home.  Multiple Orthopedic Surgeries: @ [facility] in [place]. And Selective Dorsal Rhisotomy.  Current Orthopedist is Dr. Darra Lis.  Was evaluated last year for new Electric wheel chair.  # Deaf: communicates with American Sign Language.  #  HLD:  Has been elevated in the past.  Currently diet controlled.   # Allergic Rhinitis:  On meds, doing well  # Glaucoma:S/p multiple opthalmalogic surgeries.    # Kidney stone:  Underwent cystoscopy/ureteroscopy with lithrotripsy in November.  No new concerns  Needs FL2 form signed and completed. ------------------------------------------------------------------------------------------------------------------ Medication Compliance: compliant all of the time  Diet Compliance: compliant most of the time  ------------------------------------------------------------------------------------------------------------------ Acute Concerns:  # Rash: on left nasolabial fold.    ROS:  PER HPI  Pertinent History Reviewed:  Medical & Surgical Hx:  Reviewed & Updated - see associated sections in EMR. Medications: Reviewed & Updated - see associated section in EMR. Social History: Reviewed & Updated - see associated section in EMR.  Significant for  reports that he has never smoked. He has never used smokeless tobacco.  Objective Findings:  Vitals:  Filed Vitals:   05/08/13 0929  BP: 116/87  Pulse: 91  Temp: 98 F (36.7 C)   PE: GENERAL:  Adult CP male. In no discomfort; no respiratory distress.  In manual wheel chair PSYCH: Alert and interactive, uses sign language to communicate.  Unable to verbalize H&N: AT/[place], trachea midline EENT:  MMM,  no scleral icterus, EOMi, normal TM, no nasal palor HEART: RRR, S1/S2 heard, no murmur LUNGS: CTA B, no wheezes, no crackles EXTREMITIES: spastic contractures in wheel chair Skin: small erythemaous ecematous rash on R nasolabial fold     Assessment & Plan:

## 2013-05-08 NOTE — Patient Instructions (Addendum)
It was good to see you again.  I would like to keep him on his same medications, he seems to be doing well. Keep up with the eye drops per recommendations from your eye doctor. I have completed your FL2 today.  Please return in 1 year or sooner if needed.

## 2013-08-19 ENCOUNTER — Ambulatory Visit (INDEPENDENT_AMBULATORY_CARE_PROVIDER_SITE_OTHER): Payer: BC Managed Care – PPO | Admitting: *Deleted

## 2013-08-19 DIAGNOSIS — Z23 Encounter for immunization: Secondary | ICD-10-CM

## 2013-09-02 ENCOUNTER — Ambulatory Visit (INDEPENDENT_AMBULATORY_CARE_PROVIDER_SITE_OTHER): Payer: BC Managed Care – PPO | Admitting: Sports Medicine

## 2013-09-02 ENCOUNTER — Encounter: Payer: Self-pay | Admitting: Sports Medicine

## 2013-09-02 VITALS — BP 126/70 | HR 86 | Temp 98.9°F | Resp 18 | Wt 136.0 lb

## 2013-09-02 DIAGNOSIS — G809 Cerebral palsy, unspecified: Secondary | ICD-10-CM

## 2013-09-02 DIAGNOSIS — E785 Hyperlipidemia, unspecified: Secondary | ICD-10-CM

## 2013-09-02 DIAGNOSIS — H6120 Impacted cerumen, unspecified ear: Secondary | ICD-10-CM | POA: Insufficient documentation

## 2013-09-02 DIAGNOSIS — H409 Unspecified glaucoma: Secondary | ICD-10-CM

## 2013-09-02 DIAGNOSIS — Z0000 Encounter for general adult medical examination without abnormal findings: Secondary | ICD-10-CM

## 2013-09-02 DIAGNOSIS — H6123 Impacted cerumen, bilateral: Secondary | ICD-10-CM

## 2013-09-02 DIAGNOSIS — H6121 Impacted cerumen, right ear: Secondary | ICD-10-CM

## 2013-09-02 DIAGNOSIS — L710 Perioral dermatitis: Secondary | ICD-10-CM

## 2013-09-02 DIAGNOSIS — L719 Rosacea, unspecified: Secondary | ICD-10-CM

## 2013-09-02 DIAGNOSIS — R634 Abnormal weight loss: Secondary | ICD-10-CM

## 2013-09-02 MED ORDER — CARBAMIDE PEROXIDE 6.5 % OT SOLN: 5.0000 [drp] | OTIC | Status: DC

## 2013-09-02 MED ORDER — CARRINGTON MOISTURE BARRIER EX CREA: TOPICAL_CREAM | CUTANEOUS | Status: DC | PRN

## 2013-09-02 NOTE — Progress Notes (Signed)
[facility] ALPER GUILMETTE - 32 y.o. male MRN [id number]  Date of birth: May 23, 1981  CC, HPI, Interval History & ROS  Edward Alvarado is here today to followup on his chronic medical conditions including:  CPE today    He is accompanied today by his mother, caretaker, sign language interpreter.  Head no specific complaints today.  He has supposedly had significant improvement in his perioral dermatitis with Eucerin cream.  Otherwise they report no significant changes in his dietary intake, bowel habits.  Earlier this year it is noted that he had a lithotripsy due to kidney stones.  Other acute problems include: None  Pertinent History & Care Coordination  Pt's Guardians are his parents Peyton Najjar & Himmat Enberg.  (Letter of Appointment under Media Tab)   History  Smoking status  . Never Smoker   Smokeless tobacco  . Never Used  No health maintenance topics applied. No results found for this basename: HGBA1C, TRIG, CHOL, HDL, LDLCALC, TSH,  in the last 8760 hours   Otherwise past Medical, Surgical, Social, and Family History Reviewed per EMR Medications and Allergies reviewed and all updated if necessary. Objective Findings  VITALS: HR: 86 bpm  BP: 126/70 mmHg  TEMP: 98.9 F (37.2 C) (Oral)  RESP: 99 %  HT:    WT: 136 lb (61.689 kg)  BMI:     BP Readings from Last 3 Encounters:  09/02/13 126/70  05/08/13 116/87  10/01/12 125/78   Wt Readings from Last 3 Encounters:  09/02/13 136 lb (61.689 kg)  05/08/13 134 lb 8 oz (61.009 kg)  08/21/12 125 lb 8 oz (56.926 kg)     PHYSICAL EXAM: GENERAL:  adult CP male. In no discomfort; no respiratory distress Sitting comfortably in wheelchair, sign language interpreter present.    PSYCH: alert and appropriate, good insight   HNEENT:  bilateral cerumen impaction, with right worse than left,  Moist Membranes, no tonsillar erythema or exudate  No significant nasal exudate   CARDIO: RRR, S1/S2 heard, no murmur   LUNGS: CTA B, no wheezes, no crackles, marked spinal curvature   ABDOMEN:   EXTREM:   spastic contractures in all 4 extremities   GU:   SKIN:  improved perioral dermatitis     Assessment & Plan   Problems addressed today: General Plan & Pt Instructions:  1. Cerumen impaction, right   2. Cerumen impaction, bilateral   3. Perioral dermatitis   4. Unspecified glaucoma(365.9)   5. Preventative health care   6. Cerebral palsy   7. HYPERLIPIDEMIA   8. Weight loss       Normal Exam today  Return in June for Follow up CPE and FL2  Cerumen impaction - Start Debrox once weekly  Continue Eucerin Cream      For further discussion of A/P and for follow up issues see problem based charting.

## 2013-09-02 NOTE — Assessment & Plan Note (Signed)
Has regained a significant amount of weight he previously lost. Continue to monitor

## 2013-09-02 NOTE — Progress Notes (Signed)
Pt is here for his 32 y/o PE Needing refills on meds Voices no new concerns Alert w/no signs of acute distress.

## 2013-09-02 NOTE — Assessment & Plan Note (Signed)
Patient is not a candidate for yearly routine screening given he is not currently a candidate for statin therapy. Consider rescreen at 34th birthday or if significant weight gain or other metabolic changes Encouraged dietary changes

## 2013-09-02 NOTE — Assessment & Plan Note (Signed)
Ears flushed today. Start Debrox weekly preventatively.

## 2013-09-02 NOTE — Assessment & Plan Note (Signed)
Stable/Well Controlled - no changes at this time. No needs expressed today

## 2013-09-02 NOTE — Patient Instructions (Signed)
   Normal Exam today  Return in June for Follow up CPE and FL2  Cerumen impaction - Start Debrox once weekly  Continue Eucerin Cream    If you need anything prior to your next visit please call the clinic. Please Bring all medications or accurate medication list with you to each appointment; an accurate medication list is essential in providing you the best care possible.

## 2014-05-20 ENCOUNTER — Encounter: Payer: Self-pay | Admitting: Family Medicine

## 2014-05-20 ENCOUNTER — Ambulatory Visit (INDEPENDENT_AMBULATORY_CARE_PROVIDER_SITE_OTHER): Payer: Medicaid Other | Admitting: Family Medicine

## 2014-05-20 VITALS — BP 136/91 | HR 82 | Temp 98.2°F | Wt 143.0 lb

## 2014-05-20 DIAGNOSIS — Q150 Congenital glaucoma: Secondary | ICD-10-CM

## 2014-05-20 DIAGNOSIS — J3089 Other allergic rhinitis: Secondary | ICD-10-CM | POA: Diagnosis not present

## 2014-05-20 DIAGNOSIS — H6121 Impacted cerumen, right ear: Secondary | ICD-10-CM

## 2014-05-20 DIAGNOSIS — H6123 Impacted cerumen, bilateral: Secondary | ICD-10-CM

## 2014-05-20 DIAGNOSIS — G809 Cerebral palsy, unspecified: Secondary | ICD-10-CM

## 2014-05-20 DIAGNOSIS — L710 Perioral dermatitis: Secondary | ICD-10-CM

## 2014-05-20 DIAGNOSIS — H6120 Impacted cerumen, unspecified ear: Secondary | ICD-10-CM | POA: Diagnosis not present

## 2014-05-20 DIAGNOSIS — E785 Hyperlipidemia, unspecified: Secondary | ICD-10-CM

## 2014-05-20 DIAGNOSIS — L719 Rosacea, unspecified: Secondary | ICD-10-CM

## 2014-05-20 DIAGNOSIS — M4120 Other idiopathic scoliosis, site unspecified: Secondary | ICD-10-CM | POA: Diagnosis not present

## 2014-05-20 DIAGNOSIS — Z23 Encounter for immunization: Secondary | ICD-10-CM | POA: Diagnosis not present

## 2014-05-20 DIAGNOSIS — J302 Other seasonal allergic rhinitis: Secondary | ICD-10-CM

## 2014-05-20 LAB — LIPID PANEL
Cholesterol: 223 mg/dL — ABNORMAL HIGH (ref 0–200)
HDL: 41 mg/dL (ref >39)
LDL CALC: 146 mg/dL — AB (ref 0–99)
TRIGLYCERIDES: 180 mg/dL — AB (ref <150)
Total CHOL/HDL Ratio: 5.4 Ratio
VLDL: 36 mg/dL (ref 0–40)

## 2014-05-20 LAB — CBC WITH DIFFERENTIAL/PLATELET
Basophils Absolute: 0 10*3/uL (ref 0.0–0.1)
Basophils Relative: 0 % (ref 0–1)
EOS PCT: 9 % — AB (ref 0–5)
Eosinophils Absolute: 0.7 10*3/uL (ref 0.0–0.7)
HEMATOCRIT: 46.2 % (ref 39.0–52.0)
HEMOGLOBIN: 16.8 g/dL (ref 13.0–17.0)
LYMPHS PCT: 26 % (ref 12–46)
Lymphs Abs: 2.1 10*3/uL (ref 0.7–4.0)
MCH: 31.6 pg (ref 26.0–34.0)
MCHC: 36.4 g/dL — ABNORMAL HIGH (ref 30.0–36.0)
MCV: 87 fL (ref 78.0–100.0)
MONO ABS: 0.6 10*3/uL (ref 0.1–1.0)
MONOS PCT: 7 % (ref 3–12)
NEUTROS ABS: 4.6 10*3/uL (ref 1.7–7.7)
Neutrophils Relative %: 58 % (ref 43–77)
Platelets: 145 10*3/uL — ABNORMAL LOW (ref 150–400)
RBC: 5.31 MIL/uL (ref 4.22–5.81)
RDW: 13.1 % (ref 11.5–15.5)
WBC: 8 10*3/uL (ref 4.0–10.5)

## 2014-05-20 LAB — POCT URINALYSIS DIPSTICK
BILIRUBIN UA: NEGATIVE
Blood, UA: NEGATIVE
GLUCOSE UA: NEGATIVE
KETONES UA: NEGATIVE
Leukocytes, UA: NEGATIVE
Nitrite, UA: NEGATIVE
Protein, UA: NEGATIVE
SPEC GRAV UA: 1.02
Urobilinogen, UA: 0.2
pH, UA: 6.5

## 2014-05-20 MED ORDER — ONDANSETRON 4 MG PO TBDP: 4.0000 mg | ORAL_TABLET | Freq: Three times a day (TID) | ORAL | Status: DC | PRN

## 2014-05-20 MED ORDER — CARRINGTON MOISTURE BARRIER EX CREA: TOPICAL_CREAM | CUTANEOUS | Status: DC | PRN

## 2014-05-20 MED ORDER — ONE-DAILY MULTI VITAMINS PO TABS: 1.0000 | ORAL_TABLET | Freq: Every day | ORAL | Status: DC

## 2014-05-20 MED ORDER — LORATADINE 10 MG PO TABS: 10.0000 mg | ORAL_TABLET | Freq: Every day | ORAL | Status: DC

## 2014-05-20 MED ORDER — SALINE SPRAY 0.65 % NA SOLN: 1.0000 | NASAL | Status: DC | PRN

## 2014-05-20 MED ORDER — MONTELUKAST SODIUM 10 MG PO TABS: 10.0000 mg | ORAL_TABLET | Freq: Every day | ORAL | Status: DC

## 2014-05-20 MED ORDER — CARBAMIDE PEROXIDE 6.5 % OT SOLN: 5.0000 [drp] | OTIC | Status: DC

## 2014-05-20 NOTE — Assessment & Plan Note (Signed)
Lipid panel ordered Will call patient's caregivers with results and send written documentation as well Patient will continue current low fat diet.

## 2014-05-20 NOTE — Assessment & Plan Note (Signed)
Patient to continue to see Dr Wynelle FannyHerndon.  Next appt 07/29/14

## 2014-05-20 NOTE — Assessment & Plan Note (Signed)
Patient is stable -Vitamin D lab ordered -Lipid screening ordered -Continue current medications -Patient to call if any questions or concerns arise.

## 2014-05-20 NOTE — Progress Notes (Addendum)
Subjective:     Patient ID: Edward Alvarado, male   DOB: 04-Apr-1981, 33 y.o.   MRN: [id number]  HPI   Subjective: HPI: Patient is a 33 y.o. male presenting to clinic today for yearly physical. Concerns today include ear wax  1. Ear wax:  Patient's mother is concerned about wax impaction of patient's ears.  Patient has had problems with wax in the past and she would like a cerumen flush if appropriate.  2. Allergies: Patient continues to have clear rhinorrhea and occasional cough.  Denies fevers/ sick contacts   History Reviewed: no smoker. Family hx and Social hx reviewed. See updated EMR Health Maintenance: Tdap given today, Glaucoma/eye exam followed by Dr Wynelle FannyHerndon (next visit 9/15), Dentist 2x/yr (last 2/15)  ROS: Please see HPI above.  Objective: Office vital signs reviewed. BP 136/91  Pulse 82  Temp(Src) 98.2 F (36.8 C) (Oral)  Wt 143 lb (64.864 kg)  Physical Examination:  General: Awake, alert. NAD, accompanied by mother, sign language interpreter and caregiver HEENT: Atraumatic, normocephalic    Ears: TM opaque and sclerotic BL, moderate cerumen in    canals BL, no erythema or edema Neck: No masses palpated. No LAD Pulm: decreased breath sounds d/t body habitus but CTAB, no wheezes, rhonchi or rales Cardio: RRR, no murmurs appreciated Abdomen:+BS x4, soft, nontender, nondistended Extremities: No edema, pulses equal bilaterally- LE: +1 UE +2 Neuro: decreased strength (1-2/5 bilaterally), contractures of UE and LE Skin: intact, some sry, erythematous patches noted around oral cavity, no exudate, nonpapular  Assessment: 33 y.o. male  1. Allergic Rhinits 2. Cerebral palsy 3. Cerumen impaction 4. Scoliosis 5. Perioral dermatitis 6. OAG  Plan: See Problem List and After Visit Summary    Review of Systems    see above    Physical Exam    see above                 Caramia Boutin M. Nadine CountsGottschalk, DO

## 2014-05-20 NOTE — Patient Instructions (Signed)
It was such a pleasure meeting you today!  Please remember to take both the singulair and the claritin for you allergie daily.  Use the debrox to soften ear wax.  I will contact you with your lab results in about a week.  Feel free to call with any questions or concerns.Carbamide Peroxide ear solution What is this medicine? CARBAMIDE PEROXIDE (CAR bah mide per OX ide) is used to soften and help remove ear wax. This medicine may be used for other purposes; ask your health care provider or pharmacist if you have questions. COMMON BRAND NAME(S): Auro Ear, Auro Earache Relief, Debrox, Ear Drops, Ear Wax Removal, Ear Wax Remover, Earwax Treatment, Murine, Thera-Ear What should I tell my health care provider before I take this medicine? They need to know if you have any of these conditions: -dizziness -ear discharge -ear pain, irritation or rash -infection -perforated eardrum (hole in eardrum) -an unusual or allergic reaction to carbamide peroxide, glycerin, hydrogen peroxide, other medicines, foods, dyes, or preservatives -pregnant or trying to get pregnant -breast-feeding How should I use this medicine? This medicine is only for use in the outer ear canal. Follow the directions carefully. Wash hands before and after use. The solution may be warmed by holding the bottle in the hand for 1 to 2 minutes. Lie with the affected ear facing upward. Place the proper number of drops into the ear canal. After the drops are instilled, remain lying with the affected ear upward for 5 minutes to help the drops stay in the ear canal. A cotton ball may be gently inserted at the ear opening for no longer than 5 to 10 minutes to ensure retention. Repeat, if necessary, for the opposite ear. Do not touch the tip of the dropper to the ear, fingertips, or other surface. Do not rinse the dropper after use. Keep container tightly closed. Talk to your pediatrician regarding the use of this medicine in children. While this drug  may be used in children as young as 12 years for selected conditions, precautions do apply. Overdosage: If you think you have taken too much of this medicine contact a poison control center or emergency room at once. NOTE: This medicine is only for you. Do not share this medicine with others. What if I miss a dose? If you miss a dose, use it as soon as you can. If it is almost time for your next dose, use only that dose. Do not use double or extra doses. What may interact with this medicine? Interactions are not expected. Do not use any other ear products without asking your doctor or health care professional. This list may not describe all possible interactions. Give your health care provider a list of all the medicines, herbs, non-prescription drugs, or dietary supplements you use. Also tell them if you smoke, drink alcohol, or use illegal drugs. Some items may interact with your medicine. What should I watch for while using this medicine? This medicine is not for long-term use. Do not use for more than 4 days without checking with your health care professional. Contact your doctor or health care professional if your condition does not start to get better within a few days or if you notice burning, redness, itching or swelling. What side effects may I notice from receiving this medicine? Side effects that you should report to your doctor or health care professional as soon as possible: -allergic reactions like skin rash, itching or hives, swelling of the face, lips, or tongue -burning,  itching, and redness -worsening ear pain -rash Side effects that usually do not require medical attention (report to your doctor or health care professional if they continue or are bothersome): -abnormal sensation while putting the drops in the ear -temporary reduction in hearing (but not complete loss of hearing) This list may not describe all possible side effects. Call your doctor for medical advice about side  effects. You may report side effects to FDA at 1-800-FDA-1088. Where should I keep my medicine? Keep out of the reach of children. Store at room temperature between 15 and 30 degrees C (59 and 86 degrees F) in a tight, light-resistant container. Keep bottle away from excessive heat and direct sunlight. Throw away any unused medicine after the expiration date. NOTE: This sheet is a summary. It may not cover all possible information. If you have questions about this medicine, talk to your doctor, pharmacist, or health care provider.  2015, Elsevier/Gold Standard. (2008-02-12 14:00:02)

## 2014-05-20 NOTE — Assessment & Plan Note (Signed)
Ears flushed today with little to no wax coming out Patient give Rx for Debrox soln and instructed to call if no improvement in 1 week

## 2014-05-20 NOTE — Assessment & Plan Note (Signed)
Stable. Will continue to monitor.

## 2014-05-20 NOTE — Assessment & Plan Note (Signed)
Continue to use eucerin PRN

## 2014-05-21 LAB — VITAMIN D 25 HYDROXY (VIT D DEFICIENCY, FRACTURES): VIT D 25 HYDROXY: 41 ng/mL (ref 30–89)

## 2014-05-22 ENCOUNTER — Telehealth: Payer: Self-pay | Admitting: Family Medicine

## 2014-05-22 ENCOUNTER — Encounter: Payer: Self-pay | Admitting: Family Medicine

## 2014-05-22 DIAGNOSIS — E785 Hyperlipidemia, unspecified: Secondary | ICD-10-CM

## 2014-05-22 MED ORDER — PRAVASTATIN SODIUM 20 MG PO TABS: 20.0000 mg | ORAL_TABLET | Freq: Every day | ORAL | Status: DC

## 2014-05-22 NOTE — Telephone Encounter (Signed)
Called patient's family to discuss lab results no answer.  Letter sent as well.

## 2014-05-22 NOTE — Telephone Encounter (Signed)
Hyperlipidemia starting statin pending CMP.  Will ask staff to contact via phone.

## 2014-05-22 NOTE — Telephone Encounter (Signed)
Mother called back and was read the message and told about setting up a lab appointment. She said that she would pass this information along to his group home so that they can coordinate the appointment. She said that if the doctor has further questions to call her on her cell phone [phone]. jw

## 2014-05-26 ENCOUNTER — Other Ambulatory Visit: Payer: Medicaid Other

## 2014-05-27 ENCOUNTER — Encounter: Payer: Self-pay | Admitting: Family Medicine

## 2014-05-27 ENCOUNTER — Telehealth: Payer: Self-pay | Admitting: Family Medicine

## 2014-05-27 NOTE — Telephone Encounter (Signed)
Spoke to mother regarding Mr Degante's lipid panel and the need for CMP before beginning a statin.  Also reviewed, information about muscle pain and affects of statins on blood sugar levels, as she had concerns with literature she found on the Internet about statins.  She reported that she had muscle pain with Lipitor after 9 months of use.  I explained at length that Mr Daisey MustRambach will be monitored for these side effects and that I do not expect to have a negative outcome beginning him on low dose Pravastatin.  We also discussed lifestyle changes as an alternative to medication.  I agreed that Mr Daisey MustRambach could try consuming a low fat diet in effort to achieve his lipid goals.  His mother still wants to have the CMP done tomorrow for  Mr Viles.  Will plan to get another lipid level in 3 months and discuss using Pravastatin vs diet at that time based on lab results and feedback from patient.  Patient's mother/ home-aid to contact office with any questions or concerns.

## 2014-05-27 NOTE — Telephone Encounter (Signed)
Mother called and would like to speak to Dr. Nadine CountsGottschalk before her son's appointment tomorrow in the lab. Please call her anytime today. jw

## 2014-05-28 ENCOUNTER — Other Ambulatory Visit: Payer: Medicaid Other

## 2014-05-28 DIAGNOSIS — E785 Hyperlipidemia, unspecified: Secondary | ICD-10-CM

## 2014-05-28 LAB — COMPLETE METABOLIC PANEL WITH GFR
ALBUMIN: 4 g/dL (ref 3.5–5.2)
ALT: 28 U/L (ref 0–53)
AST: 21 U/L (ref 0–37)
Alkaline Phosphatase: 71 U/L (ref 39–117)
BILIRUBIN TOTAL: 0.7 mg/dL (ref 0.2–1.2)
BUN: 11 mg/dL (ref 6–23)
CHLORIDE: 103 meq/L (ref 96–112)
CO2: 24 meq/L (ref 19–32)
Calcium: 9 mg/dL (ref 8.4–10.5)
Creat: 0.84 mg/dL (ref 0.50–1.35)
GLUCOSE: 130 mg/dL — AB (ref 70–99)
POTASSIUM: 3.9 meq/L (ref 3.5–5.3)
Sodium: 138 mEq/L (ref 135–145)
TOTAL PROTEIN: 6.5 g/dL (ref 6.0–8.3)

## 2014-05-28 NOTE — Progress Notes (Signed)
CMP DONE TODAY Leyda Vanderwerf 

## 2014-05-29 ENCOUNTER — Encounter: Payer: Self-pay | Admitting: Family Medicine

## 2014-06-13 ENCOUNTER — Other Ambulatory Visit: Payer: Self-pay | Admitting: Family Medicine

## 2014-07-22 ENCOUNTER — Telehealth: Payer: Self-pay | Admitting: Family Medicine

## 2014-07-22 NOTE — Telephone Encounter (Signed)
Provider states pt went to dentist who noticed white place where his tonsil used to be Dentist wanted to know if he should be seen for this. Was given to Singular in July. Mother wants clarification as to dosage. She thought it PRN. Caregiver understood once a day. Please clarify

## 2014-07-23 NOTE — Telephone Encounter (Signed)
Called patient and left message to call our office back.  ~Diannie Willner, BSN, RN-BC   

## 2014-07-23 NOTE — Telephone Encounter (Signed)
Caregiver calling back.  Patient was seen at dentist yesterday and he was concerned about white spot where tonsils used to be.  Denies sore throat.  Scheduled appt with Dr. Caleb Popp for Friday (07/25/14) at 8:45 am.    Caregiver informed that Singulair is supposed to be given daily (at bedtime) and can be taken with Claritin.  Caregiver verbalized understanding.  Edward Alvarado, BSN, RN-BC

## 2014-07-25 ENCOUNTER — Encounter: Payer: Self-pay | Admitting: Family Medicine

## 2014-07-25 ENCOUNTER — Ambulatory Visit (INDEPENDENT_AMBULATORY_CARE_PROVIDER_SITE_OTHER): Payer: Medicaid Other | Admitting: Family Medicine

## 2014-07-25 VITALS — BP 119/82 | HR 80 | Temp 98.2°F | Ht 68.0 in

## 2014-07-25 DIAGNOSIS — K1370 Unspecified lesions of oral mucosa: Secondary | ICD-10-CM

## 2014-07-25 DIAGNOSIS — Z23 Encounter for immunization: Secondary | ICD-10-CM

## 2014-07-25 NOTE — Progress Notes (Signed)
    Subjective   Edward Alvarado is a 33 y.o. male that presents for a same day visit  1. Lesion in mouth: Lesion noticed by dentist three days ago. Lesion is non-tender. No trouble swallowing. No history of fever, chills or night sweats. No recent URI symptoms. Nothing has been done to treat the lesion.  History  Substance Use Topics  . Smoking status: Never Smoker   . Smokeless tobacco: Never Used  . Alcohol Use: No    ROS Per HPI  Objective   BP 119/82  Pulse 80  Temp(Src) 98.2 F (36.8 C) (Oral)  Ht  (1.727 m)  General: Well appearing, in wheel chair, in no acute distress.  HEENT: Pearly white papular lesion located in back near where left tonsil should be located. No erythema or drainage noted. Does not appear to be leukoplakia  Assessment and Plan   Please refer to problem based charting of assessment and plan

## 2014-07-25 NOTE — Assessment & Plan Note (Signed)
Possibly ebstein pearl. Do not know for sure. Overall, does not appear to be something malignant or worrisome at this point. Patient with absolutely no symptoms. Will watch for now. Return instructions given.

## 2014-07-25 NOTE — Patient Instructions (Signed)
Thank you for coming to see me today. It was a pleasure. Today we talked about:   Oral lesion: I am not sure what this is, however, there is nothing on exam that makes me worried for something bad. I think at this time, watching the lesion will be the best course of action. If it spreads, or if he develops symptoms of fever, chills, etc, please have him reevaluated.   Please make an appointment to see Dr. Nadine Counts for routine follow-up  If you have any questions or concerns, please do not hesitate to call the office at [phone].  Sincerely,  Jacquelin Hawking, MD

## 2014-09-04 ENCOUNTER — Ambulatory Visit: Payer: Medicaid Other

## 2014-09-12 ENCOUNTER — Other Ambulatory Visit: Payer: Self-pay | Admitting: Family Medicine

## 2014-09-23 ENCOUNTER — Encounter: Payer: Self-pay | Admitting: Family Medicine

## 2014-09-23 ENCOUNTER — Ambulatory Visit (INDEPENDENT_AMBULATORY_CARE_PROVIDER_SITE_OTHER): Payer: Medicaid Other | Admitting: Family Medicine

## 2014-09-23 VITALS — BP 126/89 | HR 81 | Temp 97.7°F

## 2014-09-23 DIAGNOSIS — S41112A Laceration without foreign body of left upper arm, initial encounter: Secondary | ICD-10-CM

## 2014-09-23 DIAGNOSIS — S41119A Laceration without foreign body of unspecified upper arm, initial encounter: Secondary | ICD-10-CM | POA: Insufficient documentation

## 2014-09-23 NOTE — Patient Instructions (Signed)
The wound looks good.  Continue what you are doing. In 2-3 days, stop the triple antibiotic and the dressing if it continues looking good. If it looks worse or does not continue healing, come back in 3-5 days or sooner as needed. Seek immediate care if it gets redder, firm skin around the wound, or he has fevers or chills.  Best,  Leona SingletonMaria T Katelin Kutsch, MD

## 2014-09-23 NOTE — Progress Notes (Signed)
Patient ID: Edward Alvarado, male   DOB: Mar 07, 1981, 33 y.o.   MRN: [id number]  Subjective:   CC: laceration   HPI:   Patient presents to sameday clinic for laceration with mom Edward Alvarado([name]) and provider Edward Alvarado(Letithia). He has h/o CP and deafness. Per mom, 11/6 he was at his [employer] day program and was working on a computer when a part of it fell on his left arm and caused a laceration. Nurse there looked at it at that time and stated it looked superficial. She irrigated it and since then they have been cleaning with peroxide, placing triple antibiotic and applying dry gauze dressing daily.   Patient reports mild pain that is improving. Denies purulence. Denies fevers/chills. Denies bleeding now though bled some on day of injury.  Review of Systems - Per HPI.   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 male here for cut on left forearm.    Plan:     # See problem list and after visit summary for problem-specific plans.   # Health Maintenance: Not discussed.  Follow-up: Follow up in 3-5 days for poor healing or immediately for other concerns.   Edward SingletonMaria T Hadessah Grennan, MD [facility]

## 2014-09-23 NOTE — Assessment & Plan Note (Addendum)
Occurred 4 days ago, appears to be healing well with no signs of infection/cellulitis or foreign body. Had been irrigated per mom by care provider. - Continue dressing changes daily and antibiotic topical ointment. - In 2-3 days, if drying and continuing to heal well, discontinue ointment and dressing except for night time or if going to be in dirty environment. - Return if signs of poor healing in 3-5 days, or immediately if infection, or other concerns. - Discussed with Dr Deirdre Priesthambliss.

## 2014-09-30 ENCOUNTER — Other Ambulatory Visit: Payer: Medicaid Other

## 2014-09-30 DIAGNOSIS — E785 Hyperlipidemia, unspecified: Secondary | ICD-10-CM

## 2014-09-30 LAB — LDL CHOLESTEROL, DIRECT: Direct LDL: 156 mg/dL — ABNORMAL HIGH

## 2014-09-30 NOTE — Progress Notes (Signed)
Drew direct LDL   Dewitt HoesBAJORDAN, MLS

## 2014-10-15 ENCOUNTER — Ambulatory Visit (INDEPENDENT_AMBULATORY_CARE_PROVIDER_SITE_OTHER): Payer: Medicaid Other | Admitting: Family Medicine

## 2014-10-15 ENCOUNTER — Encounter: Payer: Self-pay | Admitting: Family Medicine

## 2014-10-15 VITALS — BP 119/85 | HR 80 | Temp 98.0°F | Ht 70.0 in | Wt 153.7 lb

## 2014-10-15 DIAGNOSIS — H6121 Impacted cerumen, right ear: Secondary | ICD-10-CM

## 2014-10-15 DIAGNOSIS — E785 Hyperlipidemia, unspecified: Secondary | ICD-10-CM

## 2014-10-15 DIAGNOSIS — K1370 Unspecified lesions of oral mucosa: Secondary | ICD-10-CM

## 2014-10-15 MED ORDER — PRAVASTATIN SODIUM 20 MG PO TABS: 20.0000 mg | ORAL_TABLET | Freq: Every day | ORAL | Status: DC

## 2014-10-15 NOTE — Assessment & Plan Note (Signed)
L ear impaction resolved.  R ear still with large, dark ball of wax occluding view of TM -Recommend use of Debrox in R ear -Follow up PRN

## 2014-10-15 NOTE — Assessment & Plan Note (Signed)
Resolved.  No evidence of infection or lesions in oral cavity. O/p clear. -will continue to monitor for recurrence

## 2014-10-15 NOTE — Progress Notes (Signed)
Patient ID: Edward Alvarado, male   DOB: 10/15/1981, 33 y.o.   MRN: [id number]    Subjective: CC: f/u hyperlipidemia HPI: Patient is a 33 y.o. male presenting to clinic today for lab review, R ear irritation, white spot in throat.  Accompanied by his mother and an American sign language interpreter Edward Alvarado.  Concerns today include:  1. Hyperlipidemia Mother reports that Edward Alvarado has not been following a restricted diet as recommended at last appointment.  As he lives in an assisted facility, she thinks that his diet is not being watched as closely there as it is when he visits her on the weekends.  Labs were reviewed with her and in the setting of a strong family history of hyperlipidemia and heart disease would like to start a statin for him.    2. R ear irritation Per mother, Edward Alvarado has been itching his right ear.  At last visit he was found to have a large amount of hardened cerumen in his ears.  He has not been receiving the Debrox in R ear lately.  3. White spot in throat Patient was seen several weeks ago for a white spot in the back of his throat.  Patient denies difficulty swallowing, pain, discomfort, bleeding or fevers.  Here for re-evaluation.  History Reviewed: non smoker. Health Maintenance: UTD  ROS: All other systems reviewed and are negative.  Objective: Office vital signs reviewed. BP 119/85 mmHg  Pulse 80  Temp(Src) 98 F (36.7 C) (Oral)  Ht 5\' 10"  (1.778 m)  Wt 153 lb 11.2 oz (69.718 kg)  BMI 22.05 kg/m2  Physical Examination:  General: Awake, alert, well appearing, well nourished male sitting in a motorized wheel chair, NAD HEENT: Atraumatic, normocephalic    Neck: No masses palpated. No LAD    Ears: TM intact L ear, normal light reflex, no erythema, no bulging; R TM occluded by dark cerumen, mild flaking of skin in external auditory canal.    Throat: MMM, no erythema, tonsils without exudate.  No lesions or abnormalities observed. MSK: patient uses motorized  wheel chair for ambulation.  Assessment: 33 y.o. male with 1. Hyperlipidemia 2. R cerumen impaction 3. Resolved mouth lesion  Plan: See Problem List and After Visit Summary   Edward IpAshly M Shirely Toren, DO PGY-1, [facility]

## 2014-10-15 NOTE — Assessment & Plan Note (Addendum)
Labs reviewed.  Recent repeat LDL increased to 156.  Patient with weight gain since last visit.  In the setting of family history of heart disease and inability to exercise adequately, I will start patient on low dose statin. -Reviewed risk vs benefits with mother -Pravastatin 20mg  QD -Repeat cholesterol panel, CMP at physical -Continue diet modifications.

## 2014-10-15 NOTE — Progress Notes (Signed)
Patient is non hearing and  was assisted by Loreli DollarPat Strider interpreter from [employer]. [facility] # [id number] .Glennie HawkSimpson, Michelle R

## 2014-10-15 NOTE — Patient Instructions (Addendum)
It was a pleasure seeing you today!  Information regarding what we discussed is included in this packet.  Please feel free to call our office if any questions or concerns arise.   We are going to start Pravastatin for you today to help lower your cholesterol.  Continue a healthy diet.  We will plan to recheck cholesterol levels at your next physical. Continue Debrox in R ear for softening of wax.  Your throat looks clear.  I will see you at your physical in July or sooner if you need me.  Ashly M. Gottschalk, DO     High Cholesterol High cholesterol refers to having a high level of cholesterol in your blood. Cholesterol is a white, waxy, fat-like protein that your body needs in small amounts. Your liver makes all the cholesterol you need. Excess cholesterol comes from the food you eat. Cholesterol travels in your bloodstream through your blood vessels. If you have high cholesterol, deposits (plaque) may build up on the walls of your blood vessels. This makes the arteries narrower and stiffer. Plaque increases your risk of heart attack and stroke. Work with your health care provider to keep your cholesterol levels in a healthy range. RISK FACTORS Several things can make you more likely to have high cholesterol. These include:   Eating foods high in animal fat (saturated fat) or cholesterol.  Being overweight.  Not getting enough exercise.  Having a family history of high cholesterol. SIGNS AND SYMPTOMS High cholesterol does not cause symptoms. DIAGNOSIS  Your health care provider can do a blood test to check whether you have high cholesterol. If you are older than 20, your health care provider may check your cholesterol every 4-6 years. You may be checked more often if you already have high cholesterol or other risk factors for heart disease. The blood test for cholesterol measures the following:  Bad cholesterol (LDL cholesterol). This is the type of cholesterol that causes heart  disease. This number should be less than 100.  Good cholesterol (HDL cholesterol). This type helps protect against heart disease. A healthy level of HDL cholesterol is 60 or higher.  Total cholesterol. This is the combined number of LDL cholesterol and HDL cholesterol. A healthy number is less than 200. TREATMENT  High cholesterol can be treated with diet changes, lifestyle changes, and medicine.   Diet changes may include eating more whole grains, fruits, vegetables, nuts, and fish. You may also have to cut back on red meat and foods with a lot of added sugar.  Lifestyle changes may include getting at least 40 minutes of aerobic exercise three times a week. Aerobic exercises include walking, biking, and swimming. Aerobic exercise along with a healthy diet can help you maintain a healthy weight. Lifestyle changes may also include quitting smoking.  If diet and lifestyle changes are not enough to lower your cholesterol, your health care provider may prescribe a statin medicine. This medicine has been shown to lower cholesterol and also lower the risk of heart disease. HOME CARE INSTRUCTIONS  Only take over-the-counter or prescription medicines as directed by your health care provider.   Follow a healthy diet as directed by your health care provider. For instance:   Eat chicken (without skin), fish, veal, shellfish, ground Malaw[place]urkey breast, and round or loin cuts of red meat.  Do not eat fried foods and fatty meats, such as hot dogs and salami.   Eat plenty of fruits, such as apples.   Eat plenty of vegetables, such as  broccoli, potatoes, and carrots.   Eat beans, peas, and lentils.   Eat grains, such as barley, rice, couscous, and bulgur wheat.   Eat pasta without cream sauces.   Use skim or nonfat milk and low-fat or nonfat yogurt and cheeses. Do not eat or drink whole milk, cream, ice cream, egg yolks, and hard cheeses.   Do not eat stick margarine or tub margarines that  contain trans fats (also called partially hydrogenated oils).   Do not eat cakes, cookies, crackers, or other baked goods that contain trans fats.   Do not eat saturated tropical oils, such as coconut and palm oil.   Exercise as directed by your health care provider. Increase your activity level with activities such as gardening or walking.   Keep all follow-up appointments.  SEEK MEDICAL CARE IF:  You are struggling to maintain a healthy diet or weight.  You need help starting an exercise program.  You need help to stop smoking. SEEK IMMEDIATE MEDICAL CARE IF:  You have chest pain.  You have trouble breathing. Document Released: 10/31/2005 Document Revised: 03/17/2014 Document Reviewed: 08/23/2013 [facility] Patient Information 2015 [place]. This information is not intended to replace advice given to you by your health care provider. Make sure you discuss any questions you have with your health care provider.

## 2014-10-16 NOTE — Progress Notes (Signed)
Patient ID: Edward Alvarado, male   DOB: 07/11/1981, 33 y.o.   MRN: [id number] Discussed with Dr. Nadine CountsGottschalk and agree with her management.

## 2014-11-11 ENCOUNTER — Other Ambulatory Visit: Payer: Self-pay | Admitting: *Deleted

## 2014-11-11 DIAGNOSIS — J302 Other seasonal allergic rhinitis: Secondary | ICD-10-CM

## 2014-11-12 MED ORDER — MONTELUKAST SODIUM 10 MG PO TABS: 10.0000 mg | ORAL_TABLET | Freq: Every day | ORAL | Status: DC

## 2014-12-01 ENCOUNTER — Other Ambulatory Visit: Payer: Self-pay | Admitting: Family Medicine

## 2014-12-01 ENCOUNTER — Telehealth: Payer: Self-pay | Admitting: Family Medicine

## 2014-12-01 DIAGNOSIS — G809 Cerebral palsy, unspecified: Secondary | ICD-10-CM

## 2014-12-01 NOTE — Telephone Encounter (Signed)
Mrs. Daisey MustRambach called to ask for a referral sent [facility] in order for patient to have an assessment to get a replacement Zenaida NieceVan for handicap assessability.

## 2014-12-01 NOTE — Telephone Encounter (Signed)
Done.  Check behind me to make sure you have everything you need.  Thanks!

## 2014-12-02 NOTE — Telephone Encounter (Signed)
Put in workqueue, they will contact patient

## 2014-12-10 NOTE — Progress Notes (Unsigned)
Spoke to [employer] collins.  Referral updated.  Patient has all information needed to complete evaluation and therapy at rehab.  Illeana Edick M. Nadine CountsGottschalk, DO PGY-1, [facility] 12/10/14, 10:44a

## 2014-12-15 ENCOUNTER — Encounter: Payer: Self-pay | Admitting: Physical Therapy

## 2014-12-15 ENCOUNTER — Ambulatory Visit: Payer: Medicaid Other | Attending: Family Medicine | Admitting: Physical Therapy

## 2014-12-15 DIAGNOSIS — M419 Scoliosis, unspecified: Secondary | ICD-10-CM | POA: Diagnosis not present

## 2014-12-15 DIAGNOSIS — H409 Unspecified glaucoma: Secondary | ICD-10-CM | POA: Diagnosis not present

## 2014-12-15 DIAGNOSIS — G809 Cerebral palsy, unspecified: Secondary | ICD-10-CM | POA: Diagnosis not present

## 2014-12-15 DIAGNOSIS — R531 Weakness: Secondary | ICD-10-CM | POA: Diagnosis not present

## 2014-12-15 DIAGNOSIS — E785 Hyperlipidemia, unspecified: Secondary | ICD-10-CM | POA: Diagnosis not present

## 2014-12-15 NOTE — Therapy (Signed)
[facility] [address] [place], [place], [address] Phone: [phone]   Fax:  [phone]  Physical Therapy Treatment  Patient Details  Name: Edward Alvarado MRN: [id number] Date of Birth: [birth year] Referring Provider:  Raliegh Alvarado, Edward Alvarado, Edward Alvarado  Encounter Date: 12/15/2014      PT End of Session - 12/15/14 1534    Visit Number 1   Number of Visits 1   Authorization Type Medicaid   PT Start Time 1450   PT Stop Time 1530   PT Time Calculation (min) 40 min      Past Medical History  Diagnosis Date  . Allergy   . Cerebral palsy     W/C BOUND-ABLE TO TRANSFER W/C TO BED -DOES REQUIRE SOME HELP IN BATHROOM WITH CLOTHES--LIVES IN GROUP HOME CALLED [name]-HIS PARENTS ARE HIS LEGAL GUARDIANS  . Scoliosis   . Deafness     hearing aids-NOT WEARING AT PRESENT TIME; ABLE TO Edward Alvarado SIGN LANGUAGE WITH HIS PARENTS  . Glaucoma   . Hyperlipidemia   . Platelets decreased   . Prepatellar bursitis     Hospitalized in 2012 for sepsis secondary to bursisiitis  . Kidney stone   . Complication of anesthesia     [facility] AND PT EXPERIENCED AIRWAY / OXYGENATION PROBLEMS.  PT HAD SUBSEQUENT SURGERIES  AFTER JAN 2012 AT [facility] AND PARENTS TOLD ET USED AND PT DID FINE.  PT HAD NO ANESTHESIA PROBLEMS WITH SURGERIES PRIOR TO THE JAN 2012 SURGERY.    Past Surgical History  Procedure Laterality Date  . Multiple orthopedic procedures    . Myringotomy    . Eye surgery  2012    [facility]  . Selective rhizotomy procedure to help reduce spasticity and contractures    . Adenoidectomy    . Eye surgery      4 RIGHT EYE SURGERIES FOR GLAUCOMA AND ONE  RT EYE CATARACT EXTRACTION; LEFT EYE CATARACT EXTRACTION WITH  I STENTPROCEDURE FOR GLAUCOMA  . Cystoscopy/retrograde/ureteroscopy  10/01/2012    Procedure: CYSTOSCOPY/RETROGRADE/URETEROSCOPY;  Surgeon: Edward McLes Borden, Edward Alvarado;  Location: [facility];  Service: Urology;  Laterality:  Left;  laser lithotripsy stone extraction on left  . Holmium laser application  10/01/2012    Procedure: HOLMIUM LASER APPLICATION;  Surgeon: Edward McLes Borden, Edward Alvarado;  Location: [facility];  Service: Urology;  Laterality: Left;    There were no vitals taken for this visit.  Visit Diagnosis:  Weakness generalized      Letter of medical necessity will be written for Edward Alvarado conversion for pt. To have wheelchair accessibility to his Edward Alvarado for transportation and safety  During transport.  Please see scanned LMN for details.  Copy will be faxed to Dr. Maryelizabeth Alvarado and to pt.'s mother, Edward Alvarado.                                  Plan - 12/15/14 1534    Clinical Impression Statement pt. needs LMN for Edward Alvarado conversion for power wheelchair   PT Frequency 1x / week   PT Duration --  1 week   Consulted and Agree with Plan of Care Patient;Family member/caregiver        Problem List Patient Active Problem List   Diagnosis Date Noted  . Arm laceration 09/23/2014  . Cerumen impaction 09/02/2013  . Perioral dermatitis 05/08/2013  . Weight loss 11/07/2011  . Hyperlipidemia 07/18/2007  .  THROMBOCYTOPENIA NOS 06/26/2007  . Cerebral palsy 06/26/2007  . 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 ALLERGIC RHINITIS 06/26/2007  . SCOLIOSIS 06/26/2007    Edward Alvarado, PT 12/15/2014, 3:46 PM  [facility] [address] [place], [place], [address] Phone: [phone]   Fax:  [phone]   Physician: Edward Alvarado   Physician Documentation Your signature is required to indicate approval of the treatment plan as stated above.  Please sign and either send electronically or make a copy of this report for your files and return this physician signed original.  Please mark one 1.__approve of plan   2. ___approve of plan with the followingconditions.  ____________________________________________________________________________________________________________________________________________   ______________________                                                       _____________________ Physician Signature                                                                     Date    Faxed to Edward Alvarado for signature

## 2015-01-18 ENCOUNTER — Emergency Department (HOSPITAL_BASED_OUTPATIENT_CLINIC_OR_DEPARTMENT_OTHER): Payer: Medicaid Other

## 2015-01-18 ENCOUNTER — Emergency Department (HOSPITAL_BASED_OUTPATIENT_CLINIC_OR_DEPARTMENT_OTHER)
Admission: EM | Admit: 2015-01-18 | Discharge: 2015-01-18 | Disposition: A | Discharge status: Home / Self Care | Payer: Medicaid Other | Attending: Emergency Medicine | Admitting: Emergency Medicine

## 2015-01-18 ENCOUNTER — Encounter (HOSPITAL_BASED_OUTPATIENT_CLINIC_OR_DEPARTMENT_OTHER): Payer: Self-pay | Admitting: *Deleted

## 2015-01-18 DIAGNOSIS — L03115 Cellulitis of right lower limb: Secondary | ICD-10-CM

## 2015-01-18 DIAGNOSIS — Z862 Personal history of diseases of the blood and blood-forming organs and certain disorders involving the immune mechanism: Secondary | ICD-10-CM | POA: Insufficient documentation

## 2015-01-18 DIAGNOSIS — Z8639 Personal history of other endocrine, nutritional and metabolic disease: Secondary | ICD-10-CM | POA: Insufficient documentation

## 2015-01-18 DIAGNOSIS — Z87442 Personal history of urinary calculi: Secondary | ICD-10-CM | POA: Insufficient documentation

## 2015-01-18 DIAGNOSIS — H9190 Unspecified hearing loss, unspecified ear: Secondary | ICD-10-CM | POA: Diagnosis not present

## 2015-01-18 DIAGNOSIS — Z79899 Other long term (current) drug therapy: Secondary | ICD-10-CM | POA: Insufficient documentation

## 2015-01-18 DIAGNOSIS — Z8739 Personal history of other diseases of the musculoskeletal system and connective tissue: Secondary | ICD-10-CM | POA: Diagnosis not present

## 2015-01-18 DIAGNOSIS — R2241 Localized swelling, mass and lump, right lower limb: Secondary | ICD-10-CM | POA: Diagnosis present

## 2015-01-18 LAB — D-DIMER, QUANTITATIVE: D-Dimer, Quant: <0.27 ug/mL-FEU (ref 0.00–0.48)

## 2015-01-18 MED ORDER — CLINDAMYCIN HCL 300 MG PO CAPS: 300.0000 mg | ORAL_CAPSULE | Freq: Four times a day (QID) | ORAL | Status: DC

## 2015-01-18 MED ORDER — CLINDAMYCIN HCL 150 MG PO CAPS
300.0000 mg | ORAL_CAPSULE | Freq: Once | ORAL | Status: AC
  Administered 2015-01-18: 300 mg via ORAL
  Filled 2015-01-18: qty 2

## 2015-01-18 NOTE — Discharge Instructions (Signed)

## 2015-01-18 NOTE — ED Provider Notes (Signed)
CSN: [id number]     Arrival date & time 01/18/15  1603 History  This chart was scribed for Rolan Bucco, MD by Richarda Overlie, ED Scribe. This patient was seen in room MH01/MH01 and the patient's care was started 6:54 PM.      Chief Complaint  Patient presents with  . Leg Swelling   The history is provided by a parent. No language interpreter was used.   HPI Comments: Edward Alvarado is a 34 y.o. male with a history of cerebral palsy, deafness and glaucoma who presents to the Emergency Department complaining of right ankle swelling that started sometime between last night and this morning. Parents report that staff members at his group home deny any injury. They state that pt is in no pain. Mother reports a history of bursitis that pt was hospitalized for 11 days in the past. They state that pt does not ambulate. Parents state that pt has been acting normal. They deny fever.    Past Medical History  Diagnosis Date  . Allergy   . Cerebral palsy     W/C BOUND-ABLE TO TRANSFER W/C TO BED -DOES REQUIRE SOME HELP IN BATHROOM WITH CLOTHES--LIVES IN GROUP HOME CALLED [name]-HIS PARENTS ARE HIS LEGAL GUARDIANS  . Scoliosis   . Deafness     hearing aids-NOT WEARING AT PRESENT TIME; ABLE TO DO SIGN LANGUAGE WITH HIS PARENTS  . Glaucoma   . Hyperlipidemia   . Platelets decreased   . Prepatellar bursitis     Hospitalized in 2012 for sepsis secondary to bursisiitis  . Kidney stone   . Complication of anesthesia     JAN 2012 EYE SURGERY AT [facility] WAS USED AND PT EXPERIENCED AIRWAY / OXYGENATION PROBLEMS.  PT HAD SUBSEQUENT SURGERIES  AFTER JAN 2012 AT [facility] AND PARENTS TOLD ET USED AND PT DID FINE.  PT HAD NO ANESTHESIA PROBLEMS WITH SURGERIES PRIOR TO THE JAN 2012 SURGERY.   Past Surgical History  Procedure Laterality Date  . Multiple orthopedic procedures    . Myringotomy    . Eye surgery  2012    [facility]  . Selective rhizotomy procedure to help reduce spasticity and contractures    .  Adenoidectomy    . Eye surgery      4 RIGHT EYE SURGERIES FOR GLAUCOMA AND ONE  RT EYE CATARACT EXTRACTION; LEFT EYE CATARACT EXTRACTION WITH  I STENTPROCEDURE FOR GLAUCOMA  . Cystoscopy/retrograde/ureteroscopy  10/01/2012    Procedure: CYSTOSCOPY/RETROGRADE/URETEROSCOPY;  Surgeon: Crecencio Mc, MD;  Location: [facility];  Service: Urology;  Laterality: Left;  laser lithotripsy stone extraction on left  . Holmium laser application  10/01/2012    Procedure: HOLMIUM LASER APPLICATION;  Surgeon: Crecencio Mc, MD;  Location: [facility];  Service: Urology;  Laterality: Left;   Family History  Problem Relation Age of Onset  . Alzheimer's disease Paternal Grandmother   . Stroke Paternal Grandmother   . Asthma Mother   . Kidney Stones Mother   . Osteoporosis Mother   . Hypertension Mother   . Hyperlipidemia Mother   . Asthma Maternal Grandmother   . Stroke Paternal Grandfather   . Hyperlipidemia Father    History  Substance Use Topics  . Smoking status: Never Smoker   . Smokeless tobacco: Never Used  . Alcohol Use: No    Review of Systems  Constitutional: Negative for fever and fatigue.  Cardiovascular: Positive for leg swelling.  Gastrointestinal: Negative for nausea and vomiting.  Musculoskeletal: Negative for arthralgias.  Skin: Negative for  rash and wound.  Psychiatric/Behavioral: Negative for confusion.  All other systems reviewed and are negative.   Allergies  Betadine and Iodine  Home Medications   Prior to Admission medications   Medication Sig Start Date End Date Taking? Authorizing Provider  brimonidine-timolol (COMBIGAN) 0.2-0.5 % ophthalmic solution Place 1 drop into both eyes every 12 (twelve) hours. 05/08/13   Andrena MewsMichael D Rigby, DO  carbamide peroxide (DEBROX) 6.5 % otic solution Place 5 drops into both ears once a week. 05/20/14   Ashly Hulen SkainsM Gottschalk, DO  clindamycin (CLEOCIN) 300 MG capsule Take 1 capsule (300 mg total) by mouth 4 (four) times daily. X 7 days 01/18/15   Rolan BuccoMelanie  Jahel Wavra, MD  loratadine (CLARITIN) 10 MG tablet Take 1 tablet (10 mg total) by mouth daily. In am 05/20/14   Ashly M Gottschalk, DO  montelukast (SINGULAIR) 10 MG tablet Take 1 tablet (10 mg total) by mouth at bedtime. 11/12/14   Shirlee LatchAngela Bacigalupo, MD  multivitamin (ONE-A-DAY MEN'S) TABS tablet Take ONE tablet by mouth once daily 09/12/14   Ashly M Gottschalk, DO  ondansetron (ZOFRAN-ODT) 4 MG disintegrating tablet TAKE 1 TABLET (4 MG TOTAL) BY MOUTH EVERY EIGHT HOURS AS NEEDED FOR NAUSEA. 06/13/14   [place] N Rumley, DO  pravastatin (PRAVACHOL) 20 MG tablet Take 1 tablet (20 mg total) by mouth daily. 10/15/14   Raliegh IpAshly M Gottschalk, DO  Skin Protectants, Misc. (EUCERIN) cream Apply topically as needed (dry skin). Generic okay 05/20/14   Raliegh IpAshly M Gottschalk, DO  sodium chloride (OCEAN) 0.65 % SOLN nasal spray Place 1 spray into both nostrils as needed for congestion. As needed for allergy symptoms 05/20/14   Ashly M Gottschalk, DO   BP 150/101 mmHg  Pulse 92  Temp(Src) 98.8 F (37.1 C) (Oral)  Resp 18  Ht 5\' 9"  (1.753 m)  Wt 153 lb (69.4 kg)  BMI 22.58 kg/m2  SpO2 100% Physical Exam  Constitutional: He is oriented to person, place, and time. He appears well-developed and well-nourished.  HENT:  Head: Normocephalic and atraumatic.  Eyes: Pupils are equal, round, and reactive to light. Right eye exhibits no discharge. Left eye exhibits no discharge.  Neck: Normal range of motion. Neck supple. No tracheal deviation present.  Cardiovascular: Normal rate, regular rhythm and normal heart sounds.   Pulmonary/Chest: Effort normal and breath sounds normal. No respiratory distress. He has no wheezes. He has no rales. He exhibits no tenderness.  Abdominal: Soft. Bowel sounds are normal. He exhibits no distension. There is no tenderness. There is no rebound and no guarding.  Musculoskeletal: Normal range of motion. He exhibits edema.  Patient has 1+ pitting edema of the right lower leg. There some mild warmth and  erythema of the pretibial area. There is no definite bony tenderness noted although his exam is limited due to his underlying deficits. There is no obvious tenderness to the knee or the hip. There is no obvious tenderness to the ankle or the foot. Pedal pulses are intact. There is no wounds.  Lymphadenopathy:    He has no cervical adenopathy.  Neurological: He is alert and oriented to person, place, and time.  Skin: Skin is warm and dry. No rash noted.  Psychiatric: He has a normal mood and affect.  Nursing note and vitals reviewed.   ED Course  Procedures   DIAGNOSTIC STUDIES: Oxygen Saturation is 99% on RA, normal by my interpretation.    COORDINATION OF CARE: 6:59 PM Discussed treatment plan with pt at bedside and pt agreed  to plan.   Labs Review Labs Reviewed  D-DIMER, QUANTITATIVE    Imaging Review Dg Tibia/fibula Right  01/18/2015   CLINICAL DATA:  History of cerebral palsy, deafness and glaucoma. Right ankle swelling for 1 day. History of bursitis.  EXAM: RIGHT TIBIA AND FIBULA - 2 VIEW  COMPARISON:  None.  FINDINGS: Gracile bones. No evidence of acute fracture, dislocation or bone destruction. There is generalized soft tissue edema without evidence of radiopaque foreign body or soft tissue emphysema.  IMPRESSION: Generalized soft tissue edema.  No acute osseous findings.   Electronically Signed   By: Carey Bullocks M.D.   On: 01/18/2015 19:55   Dg Ankle Complete Right  01/18/2015   CLINICAL DATA:  Ankle swelling  EXAM: RIGHT ANKLE - COMPLETE 3+ VIEW  COMPARISON:  None.  FINDINGS: There is no evidence of fracture, dislocation, or joint effusion. There is no evidence of arthropathy or other focal bone abnormality. Diffuse soft tissue swelling.  IMPRESSION: Negative.   Electronically Signed   By: Marlan Palau M.D.   On: 01/18/2015 17:23   Dg Foot Complete Right  01/18/2015   CLINICAL DATA:  Cerebral palsy. Right ankle swelling. History of bursitis. Non ambulatory patient.  EXAM:  RIGHT FOOT COMPLETE - 3+ VIEW  COMPARISON:  None  FINDINGS: No malalignment at the Lisfranc joint or forefoot fracture identified. No midfoot fracture noted. 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  Dorsal soft tissue swelling along the forefoot, etiology uncertain. No bony destructive findings.  IMPRESSION: 1. Dorsal subcutaneous edema along the forefoot, etiology uncertain. 2. 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   Electronically Signed   By: Gaylyn Rong M.D.   On: 01/18/2015 19:55     EKG Interpretation None      MDM   Final diagnoses:  Cellulitis of right lower extremity   Patient had x-rays of the tib-fib ankle in the foot which did not reveal any underlying traumatic injuries. I checked a d-dimer which was negative however given his immobility issues, I will bring him back tomorrow to do a Doppler ultrasound to assess for DVT. We are unable to do this tonight. Given the warmth and erythema with the swelling of his leg I will go ahead and start him on antibiotics for possible cellulitis. His parents are given strict return precautions.  I personally performed the services described in this documentation, which was scribed in my presence.  The recorded information has been reviewed and considered.      Rolan Bucco, MD 01/18/15 [phone]

## 2015-01-18 NOTE — ED Notes (Signed)
Pt assisted to bed by father, warm blanket given. Coke and Grahams provided. Apologized to patient for delay, they expressed understanding.

## 2015-01-18 NOTE — ED Notes (Addendum)
Patients right leg swollen from mid-calf and includes ankle and foot. Ankle and foot has pitting edema. Does not seem to be painful

## 2015-01-18 NOTE — ED Notes (Signed)
Patient transported to X-ray 

## 2015-01-19 ENCOUNTER — Ambulatory Visit (HOSPITAL_BASED_OUTPATIENT_CLINIC_OR_DEPARTMENT_OTHER)
Admission: RE | Admit: 2015-01-19 | Discharge: 2015-01-19 | Disposition: A | Discharge status: Home / Self Care | Payer: Medicaid Other | Source: Ambulatory Visit | Attending: Emergency Medicine | Admitting: Emergency Medicine

## 2015-01-19 DIAGNOSIS — M7989 Other specified soft tissue disorders: Secondary | ICD-10-CM | POA: Diagnosis not present

## 2015-01-27 ENCOUNTER — Telehealth: Payer: Self-pay | Admitting: Family Medicine

## 2015-01-27 NOTE — Telephone Encounter (Signed)
Has finished antibotic on Sunday for cellulitis Foot still swollen. Is this normal or does he need to come in for a visit?

## 2015-01-27 NOTE — Telephone Encounter (Signed)
Patient should come in to be seen.  Symptoms should have improved on antibiotics.  Please advise patient.

## 2015-01-29 NOTE — Telephone Encounter (Signed)
LVM for [name] to call back

## 2015-02-02 ENCOUNTER — Telehealth: Payer: Self-pay | Admitting: Family Medicine

## 2015-02-02 NOTE — Telephone Encounter (Signed)
Spoke to patient's mother Ms Steward DroneBrenda regarding "cellulitis not improving".  She reports that it does not look red or feel hot.  Patient is not having fevers and for all intensive purposes is feeling well.  She reports that the affected area us just still swollen looking but otherwise is normal.  We discussed that if area changes, meaning becomes hot, red, or painful or patient becomes febrile or ill appearing that she should seek immediate medical care.  Otherwise, it is ok to keep 4/1 appointment time.  Talonda Artist M. Nadine CountsGottschalk, DO PGY-1, [facility]

## 2015-02-02 NOTE — Telephone Encounter (Signed)
Called about pt Cellulitis not improving. I made appt for pt on 4/1 with Dr. Caleb PoppNettey, please let pt mother Steward Drone([name]) know if they need to be seen sooner / Dorothey BasemanSadie Reynolds, ASA

## 2015-02-02 NOTE — Telephone Encounter (Signed)
na

## 2015-02-13 ENCOUNTER — Ambulatory Visit: Payer: Medicaid Other | Admitting: Family Medicine

## 2015-04-06 ENCOUNTER — Encounter: Payer: Self-pay | Admitting: Family Medicine

## 2015-04-06 NOTE — Progress Notes (Signed)
[employer] form dropped off to be filled out.  Please mail back in attached envelope.

## 2015-04-06 NOTE — Progress Notes (Signed)
complete

## 2015-04-07 ENCOUNTER — Telehealth: Payer: Self-pay | Admitting: *Deleted

## 2015-04-07 NOTE — Telephone Encounter (Signed)
Contacted pt mother and informed her that the [employer] forms would be put in the mail today. Lamonte SakaiZimmerman Rumple, April D, [place]

## 2015-04-09 NOTE — Progress Notes (Signed)
Forms completed, pt contacted and informed and forms were placed in the mail on 04/07/2015. Lamonte SakaiZimmerman Rumple, Shoni Quijas D, [place]

## 2015-05-11 ENCOUNTER — Other Ambulatory Visit: Payer: Self-pay | Admitting: Family Medicine

## 2015-05-22 ENCOUNTER — Ambulatory Visit (INDEPENDENT_AMBULATORY_CARE_PROVIDER_SITE_OTHER): Payer: Medicaid Other | Admitting: Family Medicine

## 2015-05-22 ENCOUNTER — Encounter: Payer: Self-pay | Admitting: Family Medicine

## 2015-05-22 VITALS — BP 119/58 | HR 64 | Temp 98.0°F | Ht 69.0 in | Wt 153.8 lb

## 2015-05-22 DIAGNOSIS — H6121 Impacted cerumen, right ear: Secondary | ICD-10-CM

## 2015-05-22 DIAGNOSIS — E785 Hyperlipidemia, unspecified: Secondary | ICD-10-CM

## 2015-05-22 DIAGNOSIS — Z0000 Encounter for general adult medical examination without abnormal findings: Secondary | ICD-10-CM | POA: Diagnosis not present

## 2015-05-22 DIAGNOSIS — G809 Cerebral palsy, unspecified: Secondary | ICD-10-CM

## 2015-05-22 DIAGNOSIS — Z1321 Encounter for screening for nutritional disorder: Secondary | ICD-10-CM | POA: Diagnosis present

## 2015-05-22 DIAGNOSIS — Z114 Encounter for screening for human immunodeficiency virus [HIV]: Secondary | ICD-10-CM

## 2015-05-22 DIAGNOSIS — Q150 Congenital glaucoma: Secondary | ICD-10-CM

## 2015-05-22 DIAGNOSIS — Z130 Encounter for screening for diseases of the blood and blood-forming organs and certain disorders involving the immune mechanism: Secondary | ICD-10-CM

## 2015-05-22 NOTE — Patient Instructions (Signed)
Please schedule an appointment for fasting labs.  Continue to incorporate more physical activity in and limit sweets.  Follow up in 1 year or sooner if needed.  Feel free to call the office with any questions regarding care.  I will contact you with the results of your labs once they are available.  Ashly M. Nadine Counts, DO PGY-2, [facility]    Health Maintenance A healthy lifestyle and preventative care can promote health and wellness.  Maintain regular health, dental, and eye exams.  Eat a healthy diet. Foods like vegetables, fruits, whole grains, low-fat dairy products, and lean protein foods contain the nutrients you need and are low in calories. Decrease your intake of foods high in solid fats, added sugars, and salt. Get information about a proper diet from your health care provider, if necessary.  Regular physical exercise is one of the most important things you can do for your health. Most adults should get at least 150 minutes of moderate-intensity exercise (any activity that increases your heart rate and causes you to sweat) each week. In addition, most adults need muscle-strengthening exercises on 2 or more days a week.   Maintain a healthy weight. The body mass index (BMI) is a screening tool to identify possible weight problems. It provides an estimate of body fat based on height and weight. Your health care provider can find your BMI and can help you achieve or maintain a healthy weight. For males 20 years and older:  A BMI below 18.5 is considered underweight.  A BMI of 18.5 to 24.9 is normal.  A BMI of 25 to 29.9 is considered overweight.  A BMI of 30 and above is considered obese.  Maintain normal blood lipids and cholesterol by exercising and minimizing your intake of saturated fat. Eat a balanced diet with plenty of fruits and vegetables. Blood tests for lipids and cholesterol should begin at age 34 and be repeated every 5 years. If your lipid or cholesterol  levels are high, you are over age 38, or you are at high risk for heart disease, you may need your cholesterol levels checked more frequently.Ongoing high lipid and cholesterol levels should be treated with medicines if diet and exercise are not working.  If you smoke, find out from your health care provider how to quit. If you do not use tobacco, do not start.  Lung cancer screening is recommended for adults aged 55-80 years who are at high risk for developing lung cancer because of a history of smoking. A yearly low-dose CT scan of the lungs is recommended for people who have at least a 30-pack-year history of smoking and are current smokers or have quit within the past 15 years. A pack year of smoking is smoking an average of 1 pack of cigarettes a day for 1 year (for example, a 30-pack-year history of smoking could mean smoking 1 pack a day for 30 years or 2 packs a day for 15 years). Yearly screening should continue until the smoker has stopped smoking for at least 15 years. Yearly screening should be stopped for people who develop a health problem that would prevent them from having lung cancer treatment.  If you choose to drink alcohol, do not have more than 2 drinks per day. One drink is considered to be 12 oz (360 mL) of beer, 5 oz (150 mL) of wine, or 1.5 oz (45 mL) of liquor.  Avoid the use of street drugs. Do not share needles with anyone. Ask  for help if you need support or instructions about stopping the use of drugs.  High blood pressure causes heart disease and increases the risk of stroke. Blood pressure should be checked at least every 1-2 years. Ongoing high blood pressure should be treated with medicines if weight loss and exercise are not effective.  If you are 1845-35 years old, ask your health care provider if you should take aspirin to prevent heart disease.  Diabetes screening involves taking a blood sample to check your fasting blood sugar level. This should be done once every  3 years after age 35 if you are at a normal weight and without risk factors for diabetes. Testing should be considered at a younger age or be carried out more frequently if you are overweight and have at least 1 risk factor for diabetes.  Colorectal cancer can be detected and often prevented. Most routine colorectal cancer screening begins at the age of 35 and continues through age 35. However, your health care provider may recommend screening at an earlier age if you have risk factors for colon cancer. On a yearly basis, your health care provider may provide home test kits to check for hidden blood in the stool. A small camera at the end of a tube may be used to directly examine the colon (sigmoidoscopy or colonoscopy) to detect the earliest forms of colorectal cancer. Talk to your health care provider about this at age 35 when routine screening begins. A direct exam of the colon should be repeated every 5-10 years through age 35, unless early forms of precancerous polyps or small growths are found.  People who are at an increased risk for hepatitis B should be screened for this virus. You are considered at high risk for hepatitis B if:  You were born in a country where hepatitis B occurs often. Talk with your health care provider about which countries are considered high risk.  Your parents were born in a high-risk country and you have not received a shot to protect against hepatitis B (hepatitis B vaccine).  You have HIV or AIDS.  You use needles to inject street drugs.  You live with, or have sex with, someone who has hepatitis B.  You are a man who has sex with other men (MSM).  You get hemodialysis treatment.  You take certain medicines for conditions like cancer, organ transplantation, and autoimmune conditions.  Hepatitis C blood testing is recommended for all people born from [id number] through 1966 and any individual with known risk factors for hepatitis C.  Healthy men should no longer  receive prostate-specific antigen (PSA) blood tests as part of routine cancer screening. Talk to your health care provider about prostate cancer screening.  Testicular cancer screening is not recommended for adolescents or adult males who have no symptoms. Screening includes self-exam, a health care provider exam, and other screening tests. Consult with your health care provider about any symptoms you have or any concerns you have about testicular cancer.  Practice safe sex. Use condoms and avoid high-risk sexual practices to reduce the spread of sexually transmitted infections (STIs).  You should be screened for STIs, including gonorrhea and chlamydia if:  You are sexually active and are younger than 24 years.  You are older than 24 years, and your health care provider tells you that you are at risk for this type of infection.  Your sexual activity has changed since you were last screened, and you are at an increased risk for chlamydia or  gonorrhea. Ask your health care provider if you are at risk.  If you are at risk of being infected with HIV, it is recommended that you take a prescription medicine daily to prevent HIV infection. This is called pre-exposure prophylaxis (PrEP). You are considered at risk if:  You are a man who has sex with other men (MSM).  You are a heterosexual man who is sexually active with multiple partners.  You take drugs by injection.  You are sexually active with a partner who has HIV.  Talk with your health care provider about whether you are at high risk of being infected with HIV. If you choose to begin PrEP, you should first be tested for HIV. You should then be tested every 3 months for as long as you are taking PrEP.  Use sunscreen. Apply sunscreen liberally and repeatedly throughout the day. You should seek shade when your shadow is shorter than you. Protect yourself by wearing long sleeves, pants, a wide-brimmed hat, and sunglasses year round whenever you  are outdoors.  Tell your health care provider of new moles or changes in moles, especially if there is a change in shape or color. Also, tell your health care provider if a mole is larger than the size of a pencil eraser.  A one-time screening for abdominal aortic aneurysm (AAA) and surgical repair of large AAAs by ultrasound is recommended for men aged 65-75 years who are current or former smokers.  Stay current with your vaccines (immunizations). Document Released: 04/28/2008 Document Revised: 11/05/2013 Document Reviewed: 03/28/2011 [facility] Patient Information 2015 [place], [place]. This information is not intended to replace advice given to you by your health care provider. Make sure you discuss any questions you have with your health care provider.

## 2015-05-22 NOTE — Progress Notes (Signed)
Patient ID: Edward Alvarado, male   DOB: 03-07-1981, 34 y.o.   MRN: [id number]   Edward Alvarado presents to office today for his annual physical examination.  He is accompanied today by his mother, ASL interpreter and ALF caretaker.  Concerns today include:  1. R ear  Patient's mother reports that he was seen by his audiologist recently who noted that patient had b/l cerumen impactions.  They were able to remove the cerumen from the L ear but not the right.  She would like this to be taken care of at today's visit if possible.  2. Weight gain Patient's mother also voicing concern over patient's weight gain over the last year.  She reports that he is not performing transfers from his wheelchair as often as he should.  She reports that well intending assistants at the ALF are doing too much of the work for him.  She is concerned that he will become further deconditioned by this.  We discussed his diet at the ALF at length.  Patient is often eating cereal for breakfast, followed by PB&J sandwich with the choice of snack cake/fruit cup/chips as a side, and meat + veggie for dinner.  He is often found sneaking food out of the fridge at night.  Caretaker reports that several of the other residents eat these snacks so [name] also wants to eat them.  She reports that he gets upper body activity at least once weekly, participating in bowling and special Olympics activities.    Last eye exam: last week Immunizations needed: none Refills needed today: none  Past Medical History  Diagnosis Date  . Allergy   . Cerebral palsy     W/C BOUND-ABLE TO TRANSFER W/C TO BED -DOES REQUIRE SOME HELP IN BATHROOM WITH CLOTHES--LIVES IN GROUP HOME CALLED [name]-HIS PARENTS ARE HIS LEGAL GUARDIANS  . Scoliosis   . Deafness     hearing aids-NOT WEARING AT PRESENT TIME; ABLE TO DO SIGN LANGUAGE WITH HIS PARENTS  . Glaucoma   . Hyperlipidemia   . Platelets decreased   . Prepatellar bursitis     Hospitalized in  2012 for sepsis secondary to bursisiitis  . Kidney stone   . Complication of anesthesia     [facility]MA WAS USED AND PT EXPERIENCED AIRWAY / OXYGENATION PROBLEMS.  PT HAD SUBSEQUENT SURGERIES  AFTER JAN 2012 AT [facility] AND PARENTS TOLD ET USED AND PT DID FINE.  PT HAD NO ANESTHESIA PROBLEMS WITH SURGERIES PRIOR TO THE JAN 2012 SURGERY.   History   Social History  . Marital Status: Single    Spouse Name: N/A  . Number of Children: N/A  . Years of Education: N/A   Occupational History  . Not on file.   Social History Main Topics  . Smoking status: Never Smoker   . Smokeless tobacco: Never Used  . Alcohol Use: No  . Drug Use: No  . Sexual Activity: Not on file   Other Topics Concern  . Not on file   Social History Narrative   Lives in a group home with 2 other High Functioning CP young men.  24/7 supervision   Parents involved in care   Past Surgical History  Procedure Laterality Date  . Multiple orthopedic procedures    . Myringotomy    . Eye surgery  2012    [facility]  . Selective rhizotomy procedure to help reduce spasticity and contractures    . Adenoidectomy    . Eye  surgery      4 RIGHT EYE SURGERIES FOR GLAUCOMA AND ONE  RT EYE CATARACT EXTRACTION; LEFT EYE CATARACT EXTRACTION WITH  I STENTPROCEDURE FOR GLAUCOMA  . Cystoscopy/retrograde/ureteroscopy  10/01/2012    Procedure: CYSTOSCOPY/RETROGRADE/URETEROSCOPY;  Surgeon: Crecencio Mc, MD;  Location: [facility];  Service: Urology;  Laterality: Left;  laser lithotripsy stone extraction on left  . Holmium laser application  10/01/2012    Procedure: HOLMIUM LASER APPLICATION;  Surgeon: Crecencio Mc, MD;  Location: [facility];  Service: Urology;  Laterality: Left;   Family History  Problem Relation Age of Onset  . Alzheimer's disease Paternal Grandmother   . Stroke Paternal Grandmother   . Asthma Mother   . Kidney Stones Mother   . Osteoporosis Mother   . Hypertension Mother   . Hyperlipidemia Mother   . Asthma  Maternal Grandmother   . Stroke Paternal Grandfather   . Hyperlipidemia Father     ROS: Review of Systems Constitutional: negative Eyes: positive for glaucoma and redness Ears, nose, mouth, throat, and face: positive for ear wax buildup in R ear and deafness Respiratory: negative Cardiovascular: negative Gastrointestinal: negative Genitourinary:negative Integument/breast: negative Hematologic/lymphatic: negative Musculoskeletal:positive for muscle weakness Neurological: positive for gait problems and weakness Behavioral/Psych: negative Endocrine: negative Allergic/Immunologic: negative   Physical exam BP 119/58 mmHg  Pulse 64  Temp(Src) 98 F (36.7 C) (Oral)  Ht  (1.753 m)  Wt 153 lb 12.8 oz (69.763 kg)  BMI 22.70 kg/m2 General appearance: alert, cooperative, appears stated age, no distress and slowed mentation Head: Normocephalic, without obvious abnormality, atraumatic Eyes: positive findings: conjunctiva: b/l injection Ears: normal TM's and external ear canals both ears (after irrigation of R ear, which initially had cerumen blocking visualization of TM) Nose: Nares normal. Septum midline. Mucosa normal. No drainage or sinus tenderness. Throat: lips, mucosa, and tongue normal; teeth and gums normal Neck: no adenopathy, no carotid bruit, no JVD, supple, symmetrical, trachea midline and thyroid not enlarged, symmetric, no tenderness/mass/nodules Back: scoliosis present, most notible in the thoracic region Lungs: diminished breath sounds globally 2/2 scoliotic habitus Chest wall: no tenderness Heart: regular rate and rhythm, S1, S2 normal, no murmur, click, rub or gallop Abdomen: soft, non-tender; bowel sounds normal; no masses,  no organomegaly Extremities: no edema, redness or tenderness in the calves or thighs and decreased muscle tone on BL LE.  RLE muscle strength 2/5, LLE 3/5.  R UE with mild contracture.  Good hand grip b/l.  Pulses: 2+ and symmetric Skin: Skin  color, texture, turgor normal. No rashes or lesions Lymph nodes: Cervical, supraclavicular, and axillary nodes normal. Neurologic: Mental status: Alert, oriented, thought content appropriate Motor: UE and LE as above lower leg(s) bilateral  Coordination: limited 2/2 CP Gait: unsteady 2/2 CP.  Can transfer with some assistance    Assessment: Patient with h/o cerebral palsy here for annual physical exam.   Plan: Please see problem list for individual plan.  Alizee Maple M. Nadine Counts, DO PGY-2, [facility]

## 2015-05-24 NOTE — Assessment & Plan Note (Signed)
R ear with impaction that was successful removed with irrigation today.  TM appears normal.   -Will continue to monitor -Discussed the avoidance of q-tip use to prevent further impactions

## 2015-05-24 NOTE — Assessment & Plan Note (Signed)
Recently seen by ophthalmologist and was placed on Xalatan.

## 2015-05-24 NOTE — Progress Notes (Signed)
Medical screening examination/treatment/procedure(s) were performed by a resident and as supervising physician I was immediately available for consultation/collaboration.  

## 2015-05-24 NOTE — Assessment & Plan Note (Addendum)
Patient is somewhat weaker than last physical exam.  Likely 2/2 to disease vs not doing own transfers.  If continues to get weaker would consider discontinuing Statin.  Patient is otherwise doing well at his ALF.   -Will obtain annual fasting labs (CMET, Lipid, CBC, Vitamin D & HIV-discussed with mother who is agreeable to testing) -Discussed nutrition and physical activity to maintain normal BMI.  Activity is limited 2/2 CP but encouraged upper body activity at least 5 times weekly x30 minutes.   -Return in 1 year for annual exam or sooner if needed

## 2015-05-28 ENCOUNTER — Other Ambulatory Visit: Payer: Medicaid Other

## 2015-05-28 DIAGNOSIS — E785 Hyperlipidemia, unspecified: Secondary | ICD-10-CM

## 2015-05-28 DIAGNOSIS — Z114 Encounter for screening for human immunodeficiency virus [HIV]: Secondary | ICD-10-CM

## 2015-05-28 DIAGNOSIS — Z130 Encounter for screening for diseases of the blood and blood-forming organs and certain disorders involving the immune mechanism: Secondary | ICD-10-CM

## 2015-05-28 DIAGNOSIS — Z1321 Encounter for screening for nutritional disorder: Secondary | ICD-10-CM

## 2015-05-28 NOTE — Progress Notes (Signed)
FLP,VITD ,CBC,CMP AND VIT D DONE TODAY Edward Alvarado

## 2015-05-29 ENCOUNTER — Other Ambulatory Visit: Payer: Self-pay | Admitting: Family Medicine

## 2015-05-29 ENCOUNTER — Encounter: Payer: Self-pay | Admitting: Family Medicine

## 2015-05-29 DIAGNOSIS — E559 Vitamin D deficiency, unspecified: Secondary | ICD-10-CM | POA: Insufficient documentation

## 2015-05-29 LAB — CBC
HCT: 45.5 % (ref 39.0–52.0)
Hemoglobin: 15.7 g/dL (ref 13.0–17.0)
MCH: 30.9 pg (ref 26.0–34.0)
MCHC: 34.5 g/dL (ref 30.0–36.0)
MCV: 89.6 fL (ref 78.0–100.0)
MPV: 11.3 fL (ref 8.6–12.4)
Platelets: 128 10*3/uL — ABNORMAL LOW (ref 150–400)
RBC: 5.08 MIL/uL (ref 4.22–5.81)
RDW: 13.1 % (ref 11.5–15.5)
WBC: 7.1 10*3/uL (ref 4.0–10.5)

## 2015-05-29 LAB — HIV ANTIBODY (ROUTINE TESTING W REFLEX): HIV 1&2 Ab, 4th Generation: NONREACTIVE

## 2015-05-29 LAB — LIPID PANEL
CHOLESTEROL: 158 mg/dL (ref 0–200)
HDL: 37 mg/dL — ABNORMAL LOW (ref >=40)
LDL Cholesterol: 95 mg/dL (ref 0–99)
TRIGLYCERIDES: 129 mg/dL (ref <150)
Total CHOL/HDL Ratio: 4.3 Ratio
VLDL: 26 mg/dL (ref 0–40)

## 2015-05-29 LAB — COMPREHENSIVE METABOLIC PANEL
ALK PHOS: 71 U/L (ref 39–117)
ALT: 21 U/L (ref 0–53)
AST: 16 U/L (ref 0–37)
Albumin: 3.9 g/dL (ref 3.5–5.2)
BILIRUBIN TOTAL: 0.8 mg/dL (ref 0.2–1.2)
BUN: 11 mg/dL (ref 6–23)
CHLORIDE: 105 meq/L (ref 96–112)
CO2: 28 mEq/L (ref 19–32)
CREATININE: 0.8 mg/dL (ref 0.50–1.35)
Calcium: 9.2 mg/dL (ref 8.4–10.5)
GLUCOSE: 87 mg/dL (ref 70–99)
Potassium: 4.3 mEq/L (ref 3.5–5.3)
SODIUM: 141 meq/L (ref 135–145)
TOTAL PROTEIN: 6.7 g/dL (ref 6.0–8.3)

## 2015-05-29 LAB — VITAMIN D 25 HYDROXY (VIT D DEFICIENCY, FRACTURES): Vit D, 25-Hydroxy: 26 ng/mL — ABNORMAL LOW (ref 30–100)

## 2015-05-29 MED ORDER — VITAMIN D (ERGOCALCIFEROL) 1.25 MG (50000 UNIT) PO CAPS: 50000.0000 [IU] | ORAL_CAPSULE | ORAL | Status: DC

## 2015-06-11 ENCOUNTER — Other Ambulatory Visit: Payer: Self-pay | Admitting: Family Medicine

## 2015-07-15 ENCOUNTER — Other Ambulatory Visit: Payer: Self-pay | Admitting: Family Medicine

## 2015-07-15 ENCOUNTER — Telehealth: Payer: Self-pay | Admitting: Family Medicine

## 2015-07-15 DIAGNOSIS — G809 Cerebral palsy, unspecified: Secondary | ICD-10-CM

## 2015-07-15 NOTE — Telephone Encounter (Signed)
Need to have a referral placed for physical therapy for upper body strength..  His mother want a consult first to discuss the benefit of this for upper body strength.  Please contact Ms. Parker or patient's mother do discuss.

## 2015-07-15 NOTE — Telephone Encounter (Signed)
Spoke with mother, Steward Drone, on the phone re: patient's needs.  Referral for PT placed.

## 2015-07-31 ENCOUNTER — Ambulatory Visit: Payer: Medicare Other | Attending: Family Medicine

## 2015-07-31 DIAGNOSIS — R262 Difficulty in walking, not elsewhere classified: Secondary | ICD-10-CM | POA: Diagnosis present

## 2015-07-31 DIAGNOSIS — R2681 Unsteadiness on feet: Secondary | ICD-10-CM | POA: Diagnosis present

## 2015-07-31 DIAGNOSIS — R531 Weakness: Secondary | ICD-10-CM | POA: Diagnosis present

## 2015-08-01 NOTE — Therapy (Signed)
[facility] [address] [place], [place], [address] Phone: [phone]   Fax:  [phone]  Physical Therapy Evaluation  Patient Details  Name: Edward Alvarado MRN: [id number] Date of Birth: 1981/08/10 Referring Edward Alvarado:  Edward Ip, DO  Encounter Date: 07/31/2015      PT End of Session - 08/01/15 1232    Visit Number 1   Number of Visits 1   Date for PT Re-Evaluation 07/31/15   Authorization Type Medicare and Medicaid-G-code every 10th visit.   PT Start Time 1532   PT Stop Time 1627   PT Time Calculation (min) 55 min   Equipment Utilized During Treatment Gait belt   Activity Tolerance Patient tolerated treatment well   Behavior During Therapy WFL for tasks assessed/performed      Past Medical History  Diagnosis Date  . Allergy   . Cerebral palsy     W/C BOUND-ABLE TO TRANSFER W/C TO BED -DOES REQUIRE SOME HELP IN BATHROOM WITH CLOTHES--LIVES IN GROUP HOME CALLED [name]-HIS PARENTS ARE HIS LEGAL GUARDIANS  . Scoliosis   . Deafness     hearing aids-NOT WEARING AT PRESENT TIME; ABLE TO DO SIGN LANGUAGE WITH HIS PARENTS  . Glaucoma   . Hyperlipidemia   . Platelets decreased   . Prepatellar bursitis     Hospitalized in 2012 for sepsis secondary to bursisiitis  . Kidney stone   . Complication of anesthesia     JAN 2012 EYE SURGERY AT [facility] WAS USED AND PT EXPERIENCED AIRWAY / OXYGENATION PROBLEMS.  PT HAD SUBSEQUENT SURGERIES  AFTER JAN 2012 AT [facility] AND PARENTS TOLD ET USED AND PT DID FINE.  PT HAD NO ANESTHESIA PROBLEMS WITH SURGERIES PRIOR TO THE JAN 2012 SURGERY.    Past Surgical History  Procedure Laterality Date  . Multiple orthopedic procedures    . Myringotomy    . Eye surgery  2012    [facility]  . Selective rhizotomy procedure to help reduce spasticity and contractures    . Adenoidectomy    . Eye surgery      4 RIGHT EYE SURGERIES FOR GLAUCOMA AND ONE  RT EYE CATARACT EXTRACTION;  LEFT EYE CATARACT EXTRACTION WITH  I STENTPROCEDURE FOR GLAUCOMA  . Cystoscopy/retrograde/ureteroscopy  10/01/2012    Procedure: CYSTOSCOPY/RETROGRADE/URETEROSCOPY;  Surgeon: Edward Mc, MD;  Location: [facility];  Service: Urology;  Laterality: Left;  laser lithotripsy stone extraction on left  . Holmium laser application  10/01/2012    Procedure: HOLMIUM LASER APPLICATION;  Surgeon: Edward Mc, MD;  Location: [facility];  Service: Urology;  Laterality: Left;    There were no vitals filed for this visit.  Visit Diagnosis:  Weakness generalized - Plan: PT plan of care cert/re-cert  Unsteadiness - Plan: PT plan of care cert/re-cert  Difficulty walking - Plan: PT plan of care cert/re-cert      Subjective Assessment - 07/31/15 1542    Subjective Pt's mom, Edward Alvarado, provided history as pt is deaf and understands sign language but only signs a few phrases/words.  Interpreter and group home Edward Alvarado present. Pt lives in a group home, and the home has been assisting in [name]'s transfers as a result he has lost upper body strength and needs to gain strength and ind.  Pt requires assist to pull up pants and tying shoes. Pt requires assist for cutting food. Pt able to bathe himself and transfer himself into shower with supervision. Pt requires assist bathroom hygiene. In 2011, pt had L prepatellar  bursitis, which caused an infection and now pt is unable to move on his knees (on the floor).  This is important as pt used to scoot on knees for mobility.    Patient is accompained by: Family member;Interpreter   Pertinent History CP, deaf needs sign language interpreter, glaucoma, L knee bursitis   Patient Stated Goals Complete w/c to chair transfers ind. (especially unlevel transfers) and during Zenaida Niece transfers to get to appointments as pt has extreme difficulty getting into van seat and family is fearful of pt falling, gain UE strength   Currently in Pain? Other (Comment)  pt is non-verbal but did not display signs of  distress            [facility] PT Assessment - 07/31/15 1552    Assessment   Medical Diagnosis CP   Onset Date/Surgical Date 07/31/11   Prior Therapy Pt had intermittent PT when he was 41-42 years old but no PT recently   Precautions   Precautions Fall   Restrictions   Weight Bearing Restrictions No   Balance Screen   Has the patient fallen in the past 6 months No   Has the patient had a decrease in activity level because of a fear of falling?  No   Is the patient reluctant to leave their home because of a fear of falling?  No   Home Environment   Living Environment Group home   Additional Comments All one level, front door is level and ramp in the back yard. Manual and power w/c. Electric indoor scooter, shower chair.    Prior Function   Level of Independence Independent with basic ADLs;Independent with transfers   Vocation Part time employment  Hand[employer] Network: [employer], swims, movies, computer games   Cognition   Overall Cognitive Status History of cognitive impairments - at baseline  EMH   Sensation   Light Touch --  pt unable to communicate if sensation intact   Coordination   Gross Motor Movements are Fluid and Coordinated Not tested  due to cognitive impairments   Fine Motor Movements are Fluid and Coordinated Not tested   Posture/Postural Control   Posture/Postural Control Postural limitations   Postural Limitations Forward head;Rounded Shoulders;Posterior pelvic tilt  in seated position   Tone   Assessment Location Right Upper Extremity;Right Lower Extremity;Left Lower Extremity  LUE WNL   ROM / Strength   AROM / PROM / Strength AROM;Strength   AROM   Overall AROM  Deficits   Overall AROM Comments L ROM WFL. R elbow flexion contracture, B knee flexion contractures.   Strength   Overall Strength Deficits   Overall Strength Comments Difficult to perform MMT 2/2 cognitive impairments. However gross assessment revealed: R UE: 2/5, L UE: 4/5, R  LE: 1-2/5, L LE: 2/5.   Flexibility   Soft Tissue Assessment /Muscle Length --  Impaired B hamstring flexibility.   Transfers   Health visitor Pivot Transfers 4: Min assist;4: Min Secondary school teacher Transfer Details (indicate cue type and reason) Pt performed sq pivot from w/c to mat (downhill) with min guard and mat to w/c (uphill) sq pivot transfer with min A to maintain balance. Cues for technique through sign language interpreter.   Ambulation/Gait   Ambulation/Gait No  pt non-ambulatory.He had HKAFOs,but has not used since 34y/o   Balance   Balance Assessed Yes   Dynamic Sitting Balance   Dynamic Sitting - Balance Support No upper extremity  supported;Feet supported   Dynamic Sitting - Level of Assistance Other (comment)  min guard   Dynamic Sitting Balance - Compensations Pt able to reach 8" outside BOS (ant and lateral directions) without LOB. Min guard for safety.   Reach (Patient is able to reach ___ inches to right, left, forward, back) 8   Dynamic Sitting - Balance Activities Lateral lean/weight shifting;Reaching for objects   Dynamic Sitting balance - Comments When reaching past 8 inches pt required assist to maintain balance and to return to midline.   RUE Tone   RUE Tone Hypertonic   RLE Tone   RLE Tone Hypertonic   LLE Tone   LLE Tone Hypertonic                           PT Education - 08/01/15 1230    Education provided Yes   Education Details PT frequency/duration   Person(s) Educated Patient;Caregiver(s);Parent(s)   Methods Explanation   Comprehension Verbalized understanding             PT Long Term Goals - 08/01/15 1238    PT LONG TERM GOAL #1   Title eval only, as pt's mom requesting HHPT due to difficulty getting to OPPT.               Plan - 08/01/15 1233    Clinical Impression Statement Pt is a 34y/o male presenting to OPPT neuro with CP. Pt is non-ambulatory and presented with decreased B  UE/LE strength, impaired flexibility, increased tone, required assist during transfers, and decreased endurance. After evaluation complete and discussing frequency, pt's mother asked if pt could receive HHPT as it is difficul for pt to get to appointments outside of group home and she would like the PT to educate his caregivers at the group home. PT will send note to MD requesting HHPT for pt, as pt would benefit from skilled PT to improve functional mobility and safety.   Pt will benefit from skilled therapeutic intervention in order to improve on the following deficits Impaired tone;Impaired flexibility;Postural dysfunction;Decreased cognition;Decreased range of motion;Decreased balance;Decreased mobility;Difficulty walking;Decreased strength;Decreased knowledge of precautions;Impaired UE functional use   Rehab Potential Good   PT Frequency One time visit  PT would recommend 2x/week for 4 weeks but pt's mom wishes for pt to have HHPT due to issues getting to OPPT.   PT Treatment/Interventions ADLs/Self Care Home Management;Neuromuscular re-education;Biofeedback;DME Instruction;Gait training;Patient/family education;Orthotic Fit/Training;Manual techniques;Balance training;Therapeutic exercise;Therapeutic activities;Functional mobility training   Consulted and Agree with Plan of Care Patient;Family member/caregiver         G-Codes - 08/08/2015 1239    Functional Assessment Tool Used Transfers: min A to perform mat to w/c transfer. Eval only.   Functional Limitation Mobility: Walking and moving around   Mobility: Walking and Moving Around Current Status [phone]) At least 60 percent but less than 80 percent impaired, limited or restricted   Mobility: Walking and Moving Around Goal Status [phone]) At least 60 percent but less than 80 percent impaired, limited or restricted   Mobility: Walking and Moving Around Discharge Status [phone]) At least 60 percent but less than 80 percent impaired, limited or  restricted       Problem List Patient Active Problem List   Diagnosis Date Noted  . Vitamin D deficiency 05/29/2015  . Cerumen impaction 09/02/2013  . Perioral dermatitis 05/08/2013  . Hyperlipidemia 07/18/2007  . THROMBOCYTOPENIA NOS 06/26/2007  . Cerebral palsy 06/26/2007  . 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 ALLERGIC RHINITIS 06/26/2007  . SCOLIOSIS 06/26/2007    Miller,Jennifer L 08/01/2015, 12:44 PM  [facility] [address] [place], [place], [address] Phone: [phone]   Fax:  [phone]    Zerita Boers, PT,DPT 08/01/2015 12:44 PM Phone: [phone] Fax: [phone]

## 2015-08-03 ENCOUNTER — Other Ambulatory Visit: Payer: Self-pay | Admitting: Family Medicine

## 2015-08-03 DIAGNOSIS — G809 Cerebral palsy, unspecified: Secondary | ICD-10-CM

## 2015-08-03 DIAGNOSIS — M4120 Other idiopathic scoliosis, site unspecified: Secondary | ICD-10-CM

## 2015-08-10 ENCOUNTER — Other Ambulatory Visit: Payer: Self-pay | Admitting: Family Medicine

## 2015-09-18 ENCOUNTER — Ambulatory Visit (INDEPENDENT_AMBULATORY_CARE_PROVIDER_SITE_OTHER): Payer: Medicare Other | Admitting: *Deleted

## 2015-09-18 DIAGNOSIS — Z23 Encounter for immunization: Secondary | ICD-10-CM

## 2015-09-22 ENCOUNTER — Other Ambulatory Visit: Payer: Self-pay | Admitting: Family Medicine

## 2015-11-02 ENCOUNTER — Other Ambulatory Visit: Payer: Self-pay | Admitting: Family Medicine

## 2015-11-11 ENCOUNTER — Telehealth: Payer: Self-pay | Admitting: Family Medicine

## 2015-11-11 ENCOUNTER — Other Ambulatory Visit: Payer: Self-pay | Admitting: Family Medicine

## 2015-11-11 DIAGNOSIS — G800 Spastic quadriplegic cerebral palsy: Secondary | ICD-10-CM

## 2015-11-11 NOTE — Telephone Encounter (Signed)
Mother is calling to see what the results were from her son's PT evaluation that was done in September. She wanted to know what the next steps are. Myriam Jacobsonjw

## 2015-11-11 NOTE — Telephone Encounter (Signed)
Will forward to MD. Francessca Friis,CMA  

## 2015-11-11 NOTE — Telephone Encounter (Signed)
Discussed evaluation with mother, Steward DroneBrenda, via telephone.  Will place HHPT orders for 2x/ week x4 weeks per PTs recommendations in September.

## 2015-11-12 ENCOUNTER — Telehealth: Payer: Self-pay | Admitting: Clinical

## 2015-11-12 NOTE — Telephone Encounter (Signed)
CSW contacted pts mother and informed her that pt. Will need to have a face to face with provider within 30 days in order for Medicare to cover home health services. Mother agreeable to contact [facility] to schedule home health services and will then schedule an appointment for pt.  Theresia BoughNorma Piper Albro, MSW, LCSW [phone]

## 2015-11-17 DIAGNOSIS — Q150 Congenital glaucoma: Secondary | ICD-10-CM | POA: Diagnosis not present

## 2015-11-17 DIAGNOSIS — G800 Spastic quadriplegic cerebral palsy: Secondary | ICD-10-CM | POA: Diagnosis not present

## 2015-11-18 ENCOUNTER — Telehealth: Payer: Self-pay | Admitting: Family Medicine

## 2015-11-18 NOTE — Telephone Encounter (Signed)
Home health nurse called and would like verbal orders for OT for the patient. Please call [phone]. jw

## 2015-11-18 NOTE — Telephone Encounter (Signed)
Spoke to [place]old, Physical therapist.  Verbal orders for OT starting next week placed.

## 2015-11-19 DIAGNOSIS — Q150 Congenital glaucoma: Secondary | ICD-10-CM | POA: Diagnosis not present

## 2015-11-19 DIAGNOSIS — G800 Spastic quadriplegic cerebral palsy: Secondary | ICD-10-CM | POA: Diagnosis not present

## 2015-11-24 DIAGNOSIS — Q150 Congenital glaucoma: Secondary | ICD-10-CM | POA: Diagnosis not present

## 2015-11-24 DIAGNOSIS — G800 Spastic quadriplegic cerebral palsy: Secondary | ICD-10-CM | POA: Diagnosis not present

## 2015-11-26 DIAGNOSIS — G800 Spastic quadriplegic cerebral palsy: Secondary | ICD-10-CM | POA: Diagnosis not present

## 2015-11-26 DIAGNOSIS — Q150 Congenital glaucoma: Secondary | ICD-10-CM | POA: Diagnosis not present

## 2015-11-30 ENCOUNTER — Telehealth: Payer: Self-pay | Admitting: Family Medicine

## 2015-11-30 NOTE — Telephone Encounter (Signed)
[place]oke to [place], [place].  Orders given verbally.

## 2015-11-30 NOTE — Telephone Encounter (Signed)
Pt's mother, Steward DroneBrenda calls requesting a letter from Dr. Nadine CountsGottschalk of a summary of medical conditions for patient for his ISP with [employer]. Mother included a very long list of items that needed to be included. I advised mother that I would have a nurse or Dr. Nadine CountsGottschalk call her back to get all the details of the letter since it was so lengthy and I did not want to miss anything. Please call her at [phone].

## 2015-11-30 NOTE — Telephone Encounter (Signed)
Edward Alvarado, OT with [facility] called requesting verbal orders for OT: 1x for 1 week, 2x for 1 week and then 1x for 1 week. Pt eval on 11/24/15. OT is for Transfer, adapt equipment training, and to assess need for splint/brace on Right hand. Also, provide caregiver training. Ok to discharge when goals are met or maximum potential. Please call Edward Alvarado with orders at [phone].

## 2015-12-01 DIAGNOSIS — Q150 Congenital glaucoma: Secondary | ICD-10-CM | POA: Diagnosis not present

## 2015-12-01 DIAGNOSIS — G800 Spastic quadriplegic cerebral palsy: Secondary | ICD-10-CM | POA: Diagnosis not present

## 2015-12-01 NOTE — Telephone Encounter (Signed)
Will forward to MD. Rayshard Schirtzinger,CMA  

## 2015-12-02 NOTE — Telephone Encounter (Signed)
Patient to fax over letter needs.  Will plan to review and write this tomorrow.  Will contact patient when ready for pick up from office.

## 2015-12-03 ENCOUNTER — Encounter: Payer: Self-pay | Admitting: Family Medicine

## 2015-12-03 DIAGNOSIS — Q150 Congenital glaucoma: Secondary | ICD-10-CM | POA: Diagnosis not present

## 2015-12-03 DIAGNOSIS — G800 Spastic quadriplegic cerebral palsy: Secondary | ICD-10-CM | POA: Diagnosis not present

## 2015-12-07 ENCOUNTER — Ambulatory Visit (INDEPENDENT_AMBULATORY_CARE_PROVIDER_SITE_OTHER): Payer: Medicare Other | Admitting: Family Medicine

## 2015-12-07 ENCOUNTER — Encounter: Payer: Self-pay | Admitting: Family Medicine

## 2015-12-07 VITALS — BP 136/91 | HR 92 | Temp 99.0°F | Wt 144.5 lb

## 2015-12-07 DIAGNOSIS — J069 Acute upper respiratory infection, unspecified: Secondary | ICD-10-CM | POA: Diagnosis present

## 2015-12-07 NOTE — Assessment & Plan Note (Signed)
URI without signs of PNA  - See avs for symptomatic treatment - f/u with PCP in 2-3 days as scheduled for recheck given he has CP and potential to develop PNA

## 2015-12-07 NOTE — Progress Notes (Signed)
  Patient name: Edward Alvarado MRN [id number]  Date of birth: [birth year]  CC & HPI:  Edward Alvarado is a 35 y.o. male presenting today for cough.   COUGH  Cough, nasal congestion, sore throat x 1 day.   Has been coughing for 1 days. Cough is: mild Sputum production: no Medications tried: OTC cough Taking blood pressure medications: no  Symptoms Runny nose: yes Mucous in back of throat: yes Throat burning or reflux: yes Wheezing or asthma: no Fever: no Chest Pain: no Shortness of breath: no Leg swelling: no Hemoptysis: no Weight loss: no  ROS see HPI Smoking Status noted  Objective Findings:  Vitals: BP 136/91 mmHg  Pulse 92  Temp(Src) 99 F (37.2 C) (Oral)  Wt 144 lb 8 oz (65.545 kg)  Gen: NAD Eyes:Sclera white; Conjunctiva pink; PERRLA; EOMI Ears: TMs clear Nose: Mucosa pink; No sinus tenderness Throat: Oral mucosa pink, Pharynx w/o exudates CV: RRR w/o m/r/g, pulses +2 b/l Resp: CTAB w/ normal respiratory effort  Assessment & Plan:   URI (upper respiratory infection) URI without signs of PNA  - See avs for symptomatic treatment - f/u with PCP in 2-3 days as scheduled for recheck given he has CP and potential to develop PNA

## 2015-12-07 NOTE — Patient Instructions (Signed)
It was great seeing you today.   Your symptoms are due to a viral illness (upper respiratory illness). Antibiotics will not help improve your symptoms, but the following will help you feel better while your body fights the virus.  Drink lots of water; Warm tea with honey can be soothing  Nasal Saline Spray can help with congestion Congestion:  Nose spray: Afrin (Phenylephrine). DO NOT USE MORE THAN 3 DAYS Oral: Pseudoephedrine Sneezing & Runny nose: Antihistamines: Zyrtec, Claritin, Allegra Pain/Sore throat: Tylenol up  every 6 hours or  every 8 hours, Ibuprofen 400-600mg  every 6 hours as needed Cough: Dextromethorphan  Wash your hands often to prevent spreading the virus  Next Appointment  Keep your appointment with your primary doctor in a few days for reassessment   If you have any questions or concerns before then, please call the clinic at [phone].  Take Care,   Dr Wenda Low .

## 2015-12-08 DIAGNOSIS — Q150 Congenital glaucoma: Secondary | ICD-10-CM | POA: Diagnosis not present

## 2015-12-08 DIAGNOSIS — G800 Spastic quadriplegic cerebral palsy: Secondary | ICD-10-CM | POA: Diagnosis not present

## 2015-12-09 ENCOUNTER — Encounter: Payer: Self-pay | Admitting: Family Medicine

## 2015-12-09 ENCOUNTER — Ambulatory Visit (INDEPENDENT_AMBULATORY_CARE_PROVIDER_SITE_OTHER): Payer: Medicare Other | Admitting: Family Medicine

## 2015-12-09 VITALS — BP 124/84 | HR 109 | Temp 98.6°F | Wt 142.3 lb

## 2015-12-09 DIAGNOSIS — J069 Acute upper respiratory infection, unspecified: Secondary | ICD-10-CM | POA: Diagnosis not present

## 2015-12-09 DIAGNOSIS — G800 Spastic quadriplegic cerebral palsy: Secondary | ICD-10-CM | POA: Diagnosis not present

## 2015-12-09 NOTE — Progress Notes (Signed)
    Subjective: CC: PT/OT follow up HPI: Edward Alvarado is a 35 y.o. male presenting to clinic today for follow up. Concerns today include:  PT/OT Mother reports that she has had good encounters with Debroah Loop, PT.  She notes that a clear plan has been made as to what patient needs.  Goals and exercises have been discussed with facility.  OT was given focus on the Left side but patient's more deficient on the Right side.  Has purchased a splint for wrists, which he wears up to 2 hours twice daily.  OT has completed visits.  PT has one more visit.  Shower chair was repositioned but it was realized that patient was doing his transfers in the best way already.  He also has rails on his bed have helped with getting out of bed.  Now backs up into the shower, which was a major improvement in transfer.  Now has a mat that helps stabilize his plate.  Left leg is now able to straighten out.  Patient now does exercise independently and with staff to continue to improve this.  Patient was very receptive to the physical therapy and participated well in the activities.  URI: Continues to have rhinorrhea.  Since patient has been home, he has been taking the tylenol and some robitussin.  Started Delsym instead of Robitussin.  Patient reports that symptoms have improved with medications.  No fevers.  Has sick contacts at his facility and mom has also been sick.  Patient's appetite is back to normal.  No vomiting, diarrhea.  Social History Reviewed. FamHx and MedHx reviewed.  Please see EMR.  ROS: Per HPI  Objective: Office vital signs reviewed. BP 124/84 mmHg  Pulse 109  Temp(Src) 98.6 F (37 C) (Oral)  Wt 142 lb 4.8 oz (64.547 kg)  Physical Examination:  General: Awake, alert, well appearing, No acute distress HEENT: Normal    Eyes: PERRLA, EOMI    Throat: slightly dry mucus membranes, no erythema Cardio: slightly tachycardic regular rhythm, S1S2 heard, no murmurs appreciated Pulm: respiratory  effort decreased 2/2 CP, otherwise, clear to auscultation bilaterally, no wheezes, rhonchi or rales, breathing normally on RA. Extremities:  Slight contracture of RUE.  Uses wheelchair for mobility.  Assessment/ Plan: 35 y.o. male   1. Spastic quadriplegic cerebral palsy (HCC).  Appears to be responding well to PT/OT. -  Will plan to continue services.   - Discussed with mother and caretaker if patient starts to decline, to call and we will resume services if they are discontinued.  2. URI (upper respiratory infection) - Continue supportive care with oral hydration, nasal saline mist, Delsym - Patient to return if febrile or cannot stay hydrated. - Otherwise, follow up in July for annual exam   Raliegh Ip, DO PGY-2, [facility]  Total time spent with patient 25 minutes.  Greater than 50% of encounter spent in coordination of care/counseling.

## 2015-12-15 DIAGNOSIS — Q150 Congenital glaucoma: Secondary | ICD-10-CM | POA: Diagnosis not present

## 2015-12-15 DIAGNOSIS — G800 Spastic quadriplegic cerebral palsy: Secondary | ICD-10-CM | POA: Diagnosis not present

## 2015-12-22 DIAGNOSIS — H26492 Other secondary cataract, left eye: Secondary | ICD-10-CM | POA: Diagnosis not present

## 2015-12-22 DIAGNOSIS — H401332 Pigmentary glaucoma, bilateral, moderate stage: Secondary | ICD-10-CM | POA: Diagnosis not present

## 2015-12-22 DIAGNOSIS — Z961 Presence of intraocular lens: Secondary | ICD-10-CM | POA: Diagnosis not present

## 2015-12-25 ENCOUNTER — Telehealth: Payer: Self-pay | Admitting: Family Medicine

## 2015-12-25 NOTE — Telephone Encounter (Signed)
Recent OT recommended a hand splint.  In order for him to use this during the day at his day progam, PCP needs to state this splint is medically necessary. Mom did not know what exactly was needed. Probably needs to include therapist recommendation.  At the last visit w/ gottschalk,  Dr was to give daily menu from group home to nutritionist for evaluation as per [facility] diet.   Mom hasnt heard from nutritionist.  Mom is meeting w/ group home on Monday Feb 20 and would like to have this recommendation in hand.

## 2015-12-28 ENCOUNTER — Encounter: Payer: Self-pay | Admitting: Family Medicine

## 2015-12-28 NOTE — Telephone Encounter (Signed)
Letter sent to front office to be placed up front for mother to pick up.  Please let patient's mother know.

## 2015-12-31 NOTE — Telephone Encounter (Signed)
Pt mom informed that letter has been placed up front and ready for pick up. Lamonte Sakai, April D, [place]

## 2015-12-31 NOTE — Telephone Encounter (Signed)
Letter printed and form placed up front under pt name.

## 2016-01-12 ENCOUNTER — Other Ambulatory Visit: Payer: Self-pay | Admitting: Family Medicine

## 2016-02-12 ENCOUNTER — Other Ambulatory Visit: Payer: Self-pay | Admitting: Family Medicine

## 2016-02-23 ENCOUNTER — Other Ambulatory Visit: Payer: Self-pay | Admitting: *Deleted

## 2016-02-23 DIAGNOSIS — J302 Other seasonal allergic rhinitis: Secondary | ICD-10-CM

## 2016-02-23 DIAGNOSIS — L710 Perioral dermatitis: Secondary | ICD-10-CM

## 2016-02-23 DIAGNOSIS — H6123 Impacted cerumen, bilateral: Secondary | ICD-10-CM

## 2016-02-23 DIAGNOSIS — H6121 Impacted cerumen, right ear: Secondary | ICD-10-CM

## 2016-02-24 MED ORDER — SALINE SPRAY 0.65 % NA SOLN: 1.0000 | NASAL | Status: DC | PRN

## 2016-02-24 MED ORDER — CARRINGTON MOISTURE BARRIER EX CREA: TOPICAL_CREAM | CUTANEOUS | Status: DC | PRN

## 2016-02-24 MED ORDER — CARBAMIDE PEROXIDE 6.5 % OT SOLN: 5.0000 [drp] | OTIC | Status: DC

## 2016-03-15 DIAGNOSIS — H26492 Other secondary cataract, left eye: Secondary | ICD-10-CM | POA: Diagnosis not present

## 2016-03-15 DIAGNOSIS — H401332 Pigmentary glaucoma, bilateral, moderate stage: Secondary | ICD-10-CM | POA: Diagnosis not present

## 2016-04-18 ENCOUNTER — Telehealth: Payer: Self-pay | Admitting: *Deleted

## 2016-04-18 NOTE — Telephone Encounter (Signed)
Kimberly from [employer] calling to check on status of wheelchair order. Please advise. Lanyah Spengler Bruna PotterBlount, CMA

## 2016-04-25 NOTE — Telephone Encounter (Signed)
Discussed patient's case with Baron SaneKimberly Wilson.  She notes that Rx was fine, apparently the office notes scanned in blacked out so she needs these again.  Will have 11/2015 notes faxed to [phone]

## 2016-04-26 NOTE — Telephone Encounter (Signed)
Order needs to be signed by Dr. Jennette KettleNeal.  Will place in her box for signature. Idan Prime,CMA

## 2016-04-28 NOTE — Telephone Encounter (Signed)
Signed rx for some new parts for his weel chair. Copy in chartNote from Dr. Nanci PinaGottshalk 12/09/2015 visit was attched Denny LevySara Montgomery Favor'

## 2016-05-20 NOTE — Telephone Encounter (Signed)
I have signed this before and was told that an attending needed to sign.  I will sign again.

## 2016-05-20 NOTE — Telephone Encounter (Signed)
Cala BradfordKimberly with [employer] calling again, states MD that signed office note on 12/09/2015 and MD signing rx needs to be the same for insurance to approve. Faxed over new rx for Dr. Nadine CountsGottschalk to sign, placed in MD box.

## 2016-05-25 ENCOUNTER — Encounter: Payer: Self-pay | Admitting: Family Medicine

## 2016-05-25 ENCOUNTER — Ambulatory Visit (INDEPENDENT_AMBULATORY_CARE_PROVIDER_SITE_OTHER): Payer: Medicare Other | Admitting: Family Medicine

## 2016-05-25 VITALS — BP 113/76 | HR 83 | Temp 98.3°F | Wt 142.2 lb

## 2016-05-25 DIAGNOSIS — E559 Vitamin D deficiency, unspecified: Secondary | ICD-10-CM

## 2016-05-25 DIAGNOSIS — Z0000 Encounter for general adult medical examination without abnormal findings: Secondary | ICD-10-CM

## 2016-05-25 DIAGNOSIS — H6123 Impacted cerumen, bilateral: Secondary | ICD-10-CM

## 2016-05-25 DIAGNOSIS — J302 Other seasonal allergic rhinitis: Secondary | ICD-10-CM

## 2016-05-25 DIAGNOSIS — R195 Other fecal abnormalities: Secondary | ICD-10-CM

## 2016-05-25 DIAGNOSIS — L710 Perioral dermatitis: Secondary | ICD-10-CM

## 2016-05-25 DIAGNOSIS — G800 Spastic quadriplegic cerebral palsy: Secondary | ICD-10-CM | POA: Diagnosis not present

## 2016-05-25 DIAGNOSIS — E785 Hyperlipidemia, unspecified: Secondary | ICD-10-CM | POA: Diagnosis not present

## 2016-05-25 DIAGNOSIS — H6121 Impacted cerumen, right ear: Secondary | ICD-10-CM

## 2016-05-25 MED ORDER — ONE-A-DAY MENS PO TABS: 1.0000 | ORAL_TABLET | Freq: Every day | ORAL | Status: DC

## 2016-05-25 MED ORDER — MONTELUKAST SODIUM 10 MG PO TABS: 10.0000 mg | ORAL_TABLET | Freq: Every day | ORAL | Status: DC

## 2016-05-25 MED ORDER — CARRINGTON MOISTURE BARRIER EX CREA: TOPICAL_CREAM | CUTANEOUS | Status: DC | PRN

## 2016-05-25 MED ORDER — CARBAMIDE PEROXIDE 6.5 % OT SOLN: 5.0000 [drp] | OTIC | Status: DC

## 2016-05-25 MED ORDER — LORATADINE 10 MG PO TABS: 10.0000 mg | ORAL_TABLET | Freq: Every day | ORAL | Status: DC

## 2016-05-25 MED ORDER — ONDANSETRON 4 MG PO TBDP: ORAL_TABLET | ORAL | Status: DC

## 2016-05-25 MED ORDER — SALINE SPRAY 0.65 % NA SOLN: 1.0000 | NASAL | Status: DC | PRN

## 2016-05-25 NOTE — Patient Instructions (Signed)
Schedule lab appointment. I will contact you will the results of your labs.  If anything is abnormal, I will call you.  Otherwise, expect a copy to be mailed to you.  I recommend using debrox daily for next 14 days in right ear, as there was significant wax buildup.  Cerumen Impaction The structures of the external ear canal secrete a waxy substance known as cerumen. Excess cerumen can build up in the ear canal, causing a condition known as cerumen impaction. Cerumen impaction can cause ear pain and disrupt the function of the ear. The rate of cerumen production differs for each individual. In certain individuals, the configuration of the ear canal may decrease his or her ability to naturally remove cerumen. CAUSES Cerumen impaction is caused by excessive cerumen production or buildup. RISK FACTORS  Frequent use of swabs to clean ears.  Having narrow ear canals.  Having eczema.  Being dehydrated. SIGNS AND SYMPTOMS  Diminished hearing.  Ear drainage.  Ear pain.  Ear itch. TREATMENT Treatment may involve:  Over-the-counter or prescription ear drops to soften the cerumen.  Removal of cerumen by a health care provider. This may be done with:  Irrigation with warm water. This is the most common method of removal.  Ear curettes and other instruments.  Surgery. This may be done in severe cases. HOME CARE INSTRUCTIONS  Take medicines only as directed by your health care provider.  Do not insert objects into the ear with the intent of cleaning the ear. PREVENTION  Do not insert objects into the ear, even with the intent of cleaning the ear. Removing cerumen as a part of normal hygiene is not necessary, and the use of swabs in the ear canal is not recommended.  Drink enough water to keep your urine clear or pale yellow.  Control your eczema if you have it. SEEK MEDICAL CARE IF:  You develop ear pain.  You develop bleeding from the ear.  The cerumen does not clear after  you use ear drops as directed.   This information is not intended to replace advice given to you by your health care provider. Make sure you discuss any questions you have with your health care provider.   Document Released: 12/08/2004 Document Revised: 11/21/2014 Document Reviewed: 06/17/2015 Elsevier Interactive Patient Education [employer]2016 [employer] Inc.

## 2016-05-25 NOTE — Progress Notes (Signed)
Edward Alvarado is a 35 y.o. male presents to office today for annual physical exam examination.  Concerns today include:  Interpreter for american sign language present for this visit.  1. Stool color Mother notes that patient's father noticed his stool was yellower compared to normal.  This has been ongoing for about 2 months.  She reports no changes in diet.  No melena, hematochezia, nausea, vomiting.  Last eye exam: 02/2016, next appt in sept Last dental exam: 02/2016, good appt. Immunizations needed: none Refills needed today: none  Past Medical History  Diagnosis Date  . Allergy   . Cerebral palsy (HCC)     W/C BOUND-ABLE TO TRANSFER W/C TO BED -DOES REQUIRE SOME HELP IN BATHROOM WITH CLOTHES--LIVES IN GROUP HOME CALLED [name]-HIS PARENTS ARE HIS LEGAL GUARDIANS  . Scoliosis   . Deafness     hearing aids-NOT WEARING AT PRESENT TIME; ABLE TO DO SIGN LANGUAGE WITH HIS PARENTS  . Glaucoma   . Hyperlipidemia   . Platelets decreased (HCC)   . Prepatellar bursitis     Hospitalized in 2012 for sepsis secondary to bursisiitis  . Kidney stone   . Complication of anesthesia     JAN 2012 EYE SURGERY AT [facility] WAS USED AND PT EXPERIENCED AIRWAY / OXYGENATION PROBLEMS.  PT HAD SUBSEQUENT SURGERIES  AFTER JAN 2012 AT [facility] AND PARENTS TOLD ET USED AND PT DID FINE.  PT HAD NO ANESTHESIA PROBLEMS WITH SURGERIES PRIOR TO THE JAN 2012 SURGERY.   Social History   Social History  . Marital Status: Single    Spouse Name: N/A  . Number of Children: N/A  . Years of Education: N/A   Occupational History  . Not on file.   Social History Main Topics  . Smoking status: Never Smoker   . Smokeless tobacco: Never Used  . Alcohol Use: No  . Drug Use: No  . Sexual Activity: Not on file   Other Topics Concern  . Not on file   Social History Narrative   Lives in a group home with 2 other High Functioning CP young men.  24/7 supervision   Parents involved in care   Past Surgical  History  Procedure Laterality Date  . Multiple orthopedic procedures    . Myringotomy    . Eye surgery  2012    [facility]  . Selective rhizotomy procedure to help reduce spasticity and contractures    . Adenoidectomy    . Eye surgery      4 RIGHT EYE SURGERIES FOR GLAUCOMA AND ONE  RT EYE CATARACT EXTRACTION; LEFT EYE CATARACT EXTRACTION WITH  I STENTPROCEDURE FOR GLAUCOMA  . Cystoscopy/retrograde/ureteroscopy  10/01/2012    Procedure: CYSTOSCOPY/RETROGRADE/URETEROSCOPY;  Surgeon: Crecencio Mc, MD;  Location: [facility];  Service: Urology;  Laterality: Left;  laser lithotripsy stone extraction on left  . Holmium laser application  10/01/2012    Procedure: HOLMIUM LASER APPLICATION;  Surgeon: Crecencio Mc, MD;  Location: [facility];  Service: Urology;  Laterality: Left;   Family History  Problem Relation Age of Onset  . Alzheimer's disease Paternal Grandmother   . Stroke Paternal Grandmother   . Asthma Mother   . Kidney Stones Mother   . Osteoporosis Mother   . Hypertension Mother   . Hyperlipidemia Mother   . Asthma Maternal Grandmother   . Stroke Paternal Grandfather   . Hyperlipidemia Father     ROS: Review of Systems Constitutional: negative Eyes: negative Ears, nose, mouth, throat, and face: negative Respiratory:  negative Cardiovascular: negative Gastrointestinal: positive for change in bowel habits Genitourinary:negative Integument/breast: negative Hematologic/lymphatic: negative Musculoskeletal:positive for paraplegia Neurological: negative Behavioral/Psych: negative Endocrine: negative Allergic/Immunologic: negative   Physical exam BP 113/76 mmHg  Pulse 83  Temp(Src) 98.3 F (36.8 C) (Oral)  Wt 142 lb 3.2 oz (64.501 kg)  SpO2 97% General appearance: alert, cooperative, appears stated age and NAD Head: Normocephalic, without obvious abnormality, atraumatic Eyes: mild conjunctival injection that appears to be irritation on the lateral apect of right eye, PERRL.  Left pupil  slighty more sluggish than right Ears: right TM occluded by cerumen, left TM with fair light reflex Nose: Nares normal. Septum midline. Mucosa normal. No drainage or sinus tenderness. Throat: lips, mucosa, and tongue normal; teeth and gums normal Neck: no adenopathy, supple, symmetrical, trachea midline and thyroid not enlarged, symmetric, no tenderness/mass/nodules Back: scoliosis present Lungs: clear to auscultation bilaterally Chest wall: no tenderness Heart: regular rate and rhythm, S1, S2 normal, no murmur, click, rub or gallop Abdomen: soft, non-tender; bowel sounds normal; no masses,  no organomegaly Extremities: spastic paraplegic, wheelchair bound Pulses: 2+ and symmetric Skin: Skin color, texture, turgor normal. No rashes or lesions Lymph nodes: Cervical, supraclavicular, and axillary nodes normal. Neurologic: light touch sensation of LE in tact, paraplegic, diminished patellar DTRs bilaterally, follows commands and uses american sign language   Assessment/ Plan:  1. Annual physical exam - history updated  2. Abnormal stool color, yellow stool.  No recent infection.  No clay stools, no bloody or melena stools.  Likely benign variant. - COMPLETE METABOLIC PANEL WITH GFR  3. Spastic quadriplegic cerebral palsy (HCC) - Stable  4. Hyperlipidemia - Lipid panel  5. Vitamin D deficiency. On MVI - VITAMIN D 25 Hydroxy (Vit-D Deficiency, Fractures)  6. Cerumen impaction, right - carbamide peroxide (DEBROX) 6.5 % otic solution; Place 5 drops into both ears once a week.  Dispense: 15 mL; Refill: 5  8. Perioral dermatitis, stable - Skin Protectants, Misc. (EUCERIN) cream; Apply topically as needed (dry skin). Generic okay  Dispense: 397 g; Refill: 0  9. Other seasonal allergic rhinitis, stable - sodium chloride (OCEAN) 0.65 % SOLN nasal spray; Place 1 spray into both nostrils as needed for congestion. As needed for allergy symptoms  Dispense: 1 Bottle; Refill: 12    Ashly M.  Nadine CountsGottschalk, DO PGY-3, [facility]

## 2016-05-30 NOTE — Telephone Encounter (Signed)
This was signed and faxed last week.  Please check the fax pile for forms.

## 2016-05-30 NOTE — Telephone Encounter (Signed)
Do you remember what day last week?  Layne Lebon,CMA

## 2016-05-30 NOTE — Telephone Encounter (Signed)
Either the 11th or 12th... I think.

## 2016-05-30 NOTE — Telephone Encounter (Signed)
[facility] and confirmed with her it was received. Petula Rotolo,CMA

## 2016-05-30 NOTE — Telephone Encounter (Signed)
Cala BradfordKimberly [phone] ext [phone], please call with status of form.  The fax is [phone]

## 2016-05-30 NOTE — Telephone Encounter (Signed)
Cala BradfordKimberly with [employer] is calling to check on the status of this order for wheelchair repairs.  Informed her of prior message but that I was unsure if it has been faxed back to them yet. Jazmin Hartsell,CMA

## 2016-06-13 ENCOUNTER — Other Ambulatory Visit: Payer: Medicare Other

## 2016-06-20 ENCOUNTER — Other Ambulatory Visit: Payer: Medicare Other

## 2016-06-20 DIAGNOSIS — E785 Hyperlipidemia, unspecified: Secondary | ICD-10-CM | POA: Diagnosis not present

## 2016-06-20 DIAGNOSIS — E559 Vitamin D deficiency, unspecified: Secondary | ICD-10-CM

## 2016-06-20 DIAGNOSIS — R195 Other fecal abnormalities: Secondary | ICD-10-CM

## 2016-06-20 LAB — LIPID PANEL
Cholesterol: 168 mg/dL (ref 125–200)
HDL: 39 mg/dL — ABNORMAL LOW (ref >=40)
LDL Cholesterol: 106 mg/dL (ref <130)
Total CHOL/HDL Ratio: 4.3 Ratio (ref <=5.0)
Triglycerides: 115 mg/dL (ref <150)
VLDL: 23 mg/dL (ref <30)

## 2016-06-20 LAB — COMPLETE METABOLIC PANEL WITH GFR
ALT: 18 U/L (ref 9–46)
AST: 15 U/L (ref 10–40)
Albumin: 3.9 g/dL (ref 3.6–5.1)
Alkaline Phosphatase: 68 U/L (ref 40–115)
BUN: 14 mg/dL (ref 7–25)
CHLORIDE: 105 mmol/L (ref 98–110)
CO2: 26 mmol/L (ref 20–31)
CREATININE: 0.84 mg/dL (ref 0.60–1.35)
Calcium: 9.1 mg/dL (ref 8.6–10.3)
GFR, Est Non African American: >89 mL/min (ref >=60)
GLUCOSE: 85 mg/dL (ref 65–99)
Potassium: 4.7 mmol/L (ref 3.5–5.3)
Sodium: 138 mmol/L (ref 135–146)
Total Bilirubin: 0.8 mg/dL (ref 0.2–1.2)
Total Protein: 6.4 g/dL (ref 6.1–8.1)

## 2016-06-21 ENCOUNTER — Telehealth (HOSPITAL_COMMUNITY): Payer: Self-pay | Admitting: Family Medicine

## 2016-06-21 DIAGNOSIS — H401332 Pigmentary glaucoma, bilateral, moderate stage: Secondary | ICD-10-CM | POA: Diagnosis not present

## 2016-06-21 LAB — VITAMIN D 25 HYDROXY (VIT D DEFICIENCY, FRACTURES): Vit D, 25-Hydroxy: 28 ng/mL — ABNORMAL LOW (ref 30–100)

## 2016-06-21 NOTE — Telephone Encounter (Signed)
Called to discuss Vit D level.  Patient vitamin d insufficient.  Recommend that patient take Vitamin D supplement daily.  Sheyann Sulton M. Nadine CountsGottschalk, DO PGY-3, [facility]

## 2016-06-22 ENCOUNTER — Other Ambulatory Visit: Payer: Self-pay | Admitting: Family Medicine

## 2016-06-22 DIAGNOSIS — E559 Vitamin D deficiency, unspecified: Secondary | ICD-10-CM

## 2016-06-22 MED ORDER — VITAMIN D (CHOLECALCIFEROL) 10 MCG (400 UNIT) PO TABS: 1.0000 | ORAL_TABLET | Freq: Every day | ORAL | 4 refills | Status: DC

## 2016-06-22 NOTE — Telephone Encounter (Signed)
Spoke with mother and she is aware of results.  States that since patient is in a group home, he will need a script for this.  Pharmacy confirmed and mom will notify his facility that a medication will be sent to pharmacy. Aracelia Brinson,CMA

## 2016-06-22 NOTE — Telephone Encounter (Signed)
Rx has been sent in for Vit D 400 qd

## 2016-06-23 ENCOUNTER — Other Ambulatory Visit: Payer: Medicare Other

## 2016-06-27 ENCOUNTER — Encounter: Payer: Self-pay | Admitting: Family Medicine

## 2016-06-27 NOTE — Progress Notes (Signed)
[employer] form filled out for wheelchair parts.  Signed by Dr Denny LevySara Neal.  Placed in fax pile  Ashly M. Nadine CountsGottschalk, DO PGY-3, [facility]

## 2016-06-30 ENCOUNTER — Telehealth: Payer: Self-pay | Admitting: Family Medicine

## 2016-06-30 ENCOUNTER — Telehealth: Payer: Self-pay | Admitting: *Deleted

## 2016-06-30 NOTE — Telephone Encounter (Signed)
Received life insurance form to fill out on behalf of patient.  Patient's father requested that this be emailed back to him.  Discussed with Steward DroneBrenda, patient's mother, that this has patient's medical information in it and cannot be sent un an unsecured network.  Will fax to [phone].  This is ok per parents.  Will also scan into chart for patient's record.  Ashly M. Nadine CountsGottschalk, DO PGY-3, [facility]

## 2016-06-30 NOTE — Telephone Encounter (Signed)
Form mentioned in previous note was faxed to the number given and also set to scan in his chart per Dr. Nadine CountsGottschalk. Edward SakaiZimmerman Alvarado, Edward Alvarado, [place]

## 2016-09-13 ENCOUNTER — Encounter: Payer: Self-pay | Admitting: Family Medicine

## 2016-09-13 NOTE — Progress Notes (Signed)
Recd request from [employer] for repair of retractable joystick. Signed and placed in FAX pile Denny LevySara Apryl Brymer

## 2016-09-20 ENCOUNTER — Ambulatory Visit (INDEPENDENT_AMBULATORY_CARE_PROVIDER_SITE_OTHER): Payer: Medicare Other | Admitting: *Deleted

## 2016-09-20 DIAGNOSIS — Z23 Encounter for immunization: Secondary | ICD-10-CM | POA: Diagnosis present

## 2016-10-17 ENCOUNTER — Other Ambulatory Visit: Payer: Self-pay | Admitting: Family Medicine

## 2016-11-22 DIAGNOSIS — H401332 Pigmentary glaucoma, bilateral, moderate stage: Secondary | ICD-10-CM | POA: Diagnosis not present

## 2016-12-27 DIAGNOSIS — Z961 Presence of intraocular lens: Secondary | ICD-10-CM | POA: Diagnosis not present

## 2016-12-27 DIAGNOSIS — H401332 Pigmentary glaucoma, bilateral, moderate stage: Secondary | ICD-10-CM | POA: Diagnosis not present

## 2017-01-02 DIAGNOSIS — G801 Spastic diplegic cerebral palsy: Secondary | ICD-10-CM | POA: Diagnosis not present

## 2017-01-02 DIAGNOSIS — H401322 Pigmentary glaucoma, left eye, moderate stage: Secondary | ICD-10-CM | POA: Diagnosis not present

## 2017-01-02 DIAGNOSIS — H9190 Unspecified hearing loss, unspecified ear: Secondary | ICD-10-CM | POA: Diagnosis not present

## 2017-01-11 ENCOUNTER — Other Ambulatory Visit: Payer: Self-pay | Admitting: Family Medicine

## 2017-01-11 DIAGNOSIS — G800 Spastic quadriplegic cerebral palsy: Secondary | ICD-10-CM

## 2017-01-16 ENCOUNTER — Telehealth: Payer: Self-pay | Admitting: Family Medicine

## 2017-01-16 NOTE — Telephone Encounter (Signed)
Please call [facility] and give my verbal approval for continued PT/OT.

## 2017-01-16 NOTE — Telephone Encounter (Signed)
Mother called and would like us to send verbal orders for [facility] her son's PT and OT. We did send in orders but we [facility] and [facility]. jw

## 2017-01-20 ENCOUNTER — Other Ambulatory Visit: Payer: Self-pay | Admitting: Family Medicine

## 2017-01-20 DIAGNOSIS — G800 Spastic quadriplegic cerebral palsy: Secondary | ICD-10-CM

## 2017-01-20 NOTE — Telephone Encounter (Signed)
LVM for pt parent to call back to let them know in order to place this referral the pt must be seen within 30 days per Medicare. If pt parent calls back please inform them of this and assist them in getting an appointment scheduled. Lamonte SakaiZimmerman Rumple, April D, [place]

## 2017-01-20 NOTE — Telephone Encounter (Signed)
Spoke with Tia and she said we just need a home health referral placed and then she can get this to [facility]. Routing to Dr. Nadine CountsGottschalk so she can place this.  Lamonte SakaiZimmerman Rumple, April D, [place]

## 2017-01-20 NOTE — Telephone Encounter (Signed)
Home health order placed.  Please cite 05/14/2016 visit.

## 2017-01-20 NOTE — Telephone Encounter (Signed)
Pt has appointment scheduled for 3/26 to take care of this referral. Edward Alvarado, Harshaan Whang D, CMA

## 2017-01-23 DIAGNOSIS — G800 Spastic quadriplegic cerebral palsy: Secondary | ICD-10-CM | POA: Diagnosis not present

## 2017-01-23 DIAGNOSIS — M419 Scoliosis, unspecified: Secondary | ICD-10-CM | POA: Diagnosis not present

## 2017-01-25 ENCOUNTER — Telehealth: Payer: Self-pay | Admitting: Family Medicine

## 2017-01-25 DIAGNOSIS — G800 Spastic quadriplegic cerebral palsy: Secondary | ICD-10-CM | POA: Diagnosis not present

## 2017-01-25 DIAGNOSIS — M419 Scoliosis, unspecified: Secondary | ICD-10-CM | POA: Diagnosis not present

## 2017-01-25 NOTE — Telephone Encounter (Signed)
[facility]old [facility] called and would like to have verbal orders for home health. 2 times a week for 2 weeks then 1 time a week for 2 weeks. jw

## 2017-01-25 NOTE — Telephone Encounter (Signed)
Yes, please provide these verbal orders.

## 2017-01-26 DIAGNOSIS — G800 Spastic quadriplegic cerebral palsy: Secondary | ICD-10-CM | POA: Diagnosis not present

## 2017-01-26 DIAGNOSIS — M419 Scoliosis, unspecified: Secondary | ICD-10-CM | POA: Diagnosis not present

## 2017-01-26 NOTE — Telephone Encounter (Signed)
[facility] and gave verbal order for home healath 2 times a week for 2 weeks and then 1 time a week for 2 weeks. Edward SpillersSharon T Oluwatosin Bracy, CMA

## 2017-01-30 ENCOUNTER — Ambulatory Visit: Payer: Medicare Other | Admitting: Physical Therapy

## 2017-01-30 ENCOUNTER — Telehealth: Payer: Self-pay | Admitting: Family Medicine

## 2017-01-30 DIAGNOSIS — M419 Scoliosis, unspecified: Secondary | ICD-10-CM | POA: Diagnosis not present

## 2017-01-30 DIAGNOSIS — G800 Spastic quadriplegic cerebral palsy: Secondary | ICD-10-CM | POA: Diagnosis not present

## 2017-01-30 NOTE — Telephone Encounter (Signed)
Approved.  Please call him and let him know.

## 2017-01-30 NOTE — Telephone Encounter (Signed)
Edward Alvarado needs verbal orders for OT once a week for three weeks and to give ok to discharge when goals met. ep

## 2017-01-31 DIAGNOSIS — G800 Spastic quadriplegic cerebral palsy: Secondary | ICD-10-CM | POA: Diagnosis not present

## 2017-01-31 DIAGNOSIS — M419 Scoliosis, unspecified: Secondary | ICD-10-CM | POA: Diagnosis not present

## 2017-01-31 NOTE — Telephone Encounter (Signed)
LVM for Roe Coombson to give us a call for OT approval. Sunday SpillersSharon T Armstead Alvarado, CMA

## 2017-02-01 DIAGNOSIS — G800 Spastic quadriplegic cerebral palsy: Secondary | ICD-10-CM | POA: Diagnosis not present

## 2017-02-01 DIAGNOSIS — M419 Scoliosis, unspecified: Secondary | ICD-10-CM | POA: Diagnosis not present

## 2017-02-06 ENCOUNTER — Ambulatory Visit (INDEPENDENT_AMBULATORY_CARE_PROVIDER_SITE_OTHER): Payer: Medicare Other | Admitting: Family Medicine

## 2017-02-06 ENCOUNTER — Encounter: Payer: Self-pay | Admitting: Family Medicine

## 2017-02-06 VITALS — BP 119/60 | HR 78 | Temp 98.6°F | Ht 69.0 in | Wt 138.8 lb

## 2017-02-06 DIAGNOSIS — G800 Spastic quadriplegic cerebral palsy: Secondary | ICD-10-CM | POA: Diagnosis present

## 2017-02-06 DIAGNOSIS — J301 Allergic rhinitis due to pollen: Secondary | ICD-10-CM

## 2017-02-06 DIAGNOSIS — E559 Vitamin D deficiency, unspecified: Secondary | ICD-10-CM | POA: Diagnosis not present

## 2017-02-06 DIAGNOSIS — H6123 Impacted cerumen, bilateral: Secondary | ICD-10-CM

## 2017-02-06 NOTE — Patient Instructions (Signed)
I recommend that he use nasal saline in each nostril up to 4 times daily as needed for allergy symptoms.  Continue Singulair and Claritin daily.  This should help improve his cough, which is likely from post nasal drip.  If he develops fevers, worsening cough, or shortness of breath, seek medical attention.  For his vitamin D insufficiency.  He can continue taking Vitamin D 400 IU daily.  This will help him maintain.  I recommend that he continue PT/OT w/ [facility] and at his group home as outlined by his physical therapist and occupational therapist.  Follow up in July for his annual physical exam.  As always, it is wonderful to see you and has been a pleasure to care for you over the last 3 years.  Dr Delynn FlavinAshly Wilhelmina Hark

## 2017-02-06 NOTE — Progress Notes (Signed)
    Subjective: CC: cerebral palsy. HPI: Edward Alvarado is a 36 y.o. male presenting to clinic today for:  ASL interpreter Cindi CarbonMark Lineberger present for this exam.  1. Cerebral palsy Patient is seen at least twice weekly right now.  Mother notes some improvement with PT.  She notes that she is unsure as to how much follow up is being done at the group home.  He is wearing a wrist brace on the right for about 1 hour day.  Patient continues to do well with transfers.  He usually self transfers with supervision.    2. Cerumen buildup Patient has Debrox at the group home but mother is unsure as to how much this being used.  He does not have hearing.  He knows when he has buildup when he gets irritation in his ears (itching).  No otalgia.  3. Cough Patient has had a cough.  His father is worried about this.  It must have developed over the last week. Endorses rhinorrhea, especially with meals.  Denies fevers.  Cannot use most nasal sprays because of glaucoma.  He has not used nasal saline rinses.  Social Hx reviewed:Marland Kitchen. MedHx, medications and allergies reviewed.  Please see EMR. ROS: Per HPI  Objective: Office vital signs reviewed. BP 119/60 (BP Location: Right Arm, Patient Position: Sitting, Cuff Size: Normal)   Pulse 78   Temp 98.6 F (37 C) (Oral)   Ht 5\' 9"  (1.753 m)   Wt 138 lb 12.8 oz (63 kg)   SpO2 93%   BMI 20.50 kg/m   Physical Examination:  General: Awake, alert, well nourished, nontoxic, No acute distress HEENT: Normal    Neck: No masses palpated. No lymphadenopathy    Ears: Tympanic membranes occluded by cerumen bilaterally    Nose: nasal turbinates moist, scant dried nasal discharge    Throat: moist mucus membranes, no erythema, no tonsillar exudate.  Airway is patent Cardio: regular rate and rhythm, S1S2 heard, no murmurs appreciated Pulm: clear to auscultation bilaterally, no wheezes, rhonchi or rales; normal work of breathing on room air Extremities: warm, No edema,  cyanosis or clubbing; +2 pulses bilaterally; decreased tonicity of LE and UE.  Contracture of RUE appreciated.  Able to stand with assistance. MSK: wheelchair bound, stands with assistance  Assessment/ Plan: 36 y.o. male   1. Spastic quadriplegic cerebral palsy (HCC).  Patient still benefiting from PT/OT, so recommend that this is continued for mobility, independence and to minimalize contracture formation. - will fax copy of note to [facility] Becky at [phone]  2. Vitamin D insufficiency - Vit D 400 IU daily  3. Chronic seasonal allergic rhinitis due to pollen - Continue Singulair, Claritin daily - Nasal saline up to 4 times daily  4. Bilateral impacted cerumen - persistent despite attempt with irrigation - Debrox daily for next 7-10 days.  Avoid qtips - Return precautions reviewed.  Follow up in July for annual exam.  Raliegh IpAshly M Dream Harman, DO PGY-3, [facility]

## 2017-02-07 ENCOUNTER — Telehealth: Payer: Self-pay

## 2017-02-07 DIAGNOSIS — G800 Spastic quadriplegic cerebral palsy: Secondary | ICD-10-CM | POA: Diagnosis not present

## 2017-02-07 DIAGNOSIS — M419 Scoliosis, unspecified: Secondary | ICD-10-CM | POA: Diagnosis not present

## 2017-02-07 NOTE — Telephone Encounter (Signed)
[facility] to [facility]. Sunday SpillersSharon T Iviona Hole, CMA

## 2017-02-07 NOTE — Telephone Encounter (Signed)
-----   Message from Raliegh IpAshly M Gottschalk, DO sent at 02/06/2017  6:00 PM EDT ----- Please fax [facility] with today's note.  See 3/26 appt for fax number and contact information  Ashly

## 2017-02-08 DIAGNOSIS — G800 Spastic quadriplegic cerebral palsy: Secondary | ICD-10-CM | POA: Diagnosis not present

## 2017-02-08 DIAGNOSIS — M419 Scoliosis, unspecified: Secondary | ICD-10-CM | POA: Diagnosis not present

## 2017-02-09 ENCOUNTER — Telehealth: Payer: Self-pay | Admitting: Family Medicine

## 2017-02-09 ENCOUNTER — Other Ambulatory Visit: Payer: Self-pay | Admitting: Family Medicine

## 2017-02-09 MED ORDER — AZELASTINE HCL 0.1 % NA SOLN: 1.0000 | Freq: Two times a day (BID) | NASAL | 12 refills | Status: DC

## 2017-02-09 NOTE — Telephone Encounter (Signed)
This has been done.  I have left ocean spray (nasal saline) on as I think that Edward Alvarado may continue to benefit from prn use of this.  Claritin and Singulair discontinued.  [facility]  Letter composed to send to group home.  Please print and fax this today.

## 2017-02-09 NOTE — Telephone Encounter (Signed)
Contacted pt mom to get fax number and she did not have it so contacted group home manager and she said she does not have fax so I am placing this letter with the order in the mail for today. Lamonte SakaiZimmerman Rumple, April D, [place]

## 2017-02-09 NOTE — Progress Notes (Signed)
Per mother's request Singulair and Claritin dc'd.  Astelin rx'd.  This is a histamine blocking nasal spray, so should not affect glaucoma.  Updated med list to be faxed to group home.  Meds ordered this encounter  Medications  . azelastine (ASTELIN) 0.1 % nasal spray    Sig: Place 1 spray into both nostrils 2 (two) times daily. Use in each nostril as directed    Dispense:  30 mL    Refill:  12     Lecretia Buczek M. Nadine CountsGottschalk, DO PGY-3, [facility]

## 2017-02-09 NOTE — Telephone Encounter (Signed)
Mother called because she brought her son in Monday to be seen. She dicussed Edward Alvarado using Azelastine and his allergist agrees. She would like a prescription of this [facility] [place]. She also needs orders faxed to his group home to start this medication. In addition to that she wants to stop the Singular, Loratadine, and the Koreacean. We will also need orders to stop these 3 medication faxed to the group home also. jw

## 2017-02-13 ENCOUNTER — Telehealth: Payer: Self-pay | Admitting: Family Medicine

## 2017-02-13 DIAGNOSIS — G800 Spastic quadriplegic cerebral palsy: Secondary | ICD-10-CM | POA: Diagnosis not present

## 2017-02-13 DIAGNOSIS — M419 Scoliosis, unspecified: Secondary | ICD-10-CM | POA: Diagnosis not present

## 2017-02-13 NOTE — Telephone Encounter (Signed)
Don from [facility] is calling to extend OT 1 time a week for 2 weeks then discharge. They are aslo requesting with the mothers approval that the patient get a referral to [facility] Therapist. The referral to include right hand splints and right elbow dynamic progressive for range of motion and right elbow extension. They would like this referral to be in about 3 weeks. If you have any questions please call him at [phone]. jw

## 2017-02-13 NOTE — Telephone Encounter (Signed)
Edward Alvarado from [facility] is calling and requesting verbal orders for PT to be extend for 1 time a week 2 weeks. jw

## 2017-02-14 NOTE — Telephone Encounter (Signed)
Please call them back and approve these orders

## 2017-02-14 NOTE — Telephone Encounter (Signed)
Message given to Edward Alvarado from PCP. Lamonte Sakai, April D, [place]

## 2017-02-15 NOTE — Telephone Encounter (Signed)
Informed Don of below. Lamonte Sakai, Medardo Hassing D, [place]

## 2017-02-15 NOTE — Telephone Encounter (Signed)
OK to give verbal orders for OT.  I will discuss referral with mother and place pending that conversation.

## 2017-02-21 DIAGNOSIS — H401332 Pigmentary glaucoma, bilateral, moderate stage: Secondary | ICD-10-CM | POA: Diagnosis not present

## 2017-02-22 DIAGNOSIS — G800 Spastic quadriplegic cerebral palsy: Secondary | ICD-10-CM | POA: Diagnosis not present

## 2017-02-22 DIAGNOSIS — M419 Scoliosis, unspecified: Secondary | ICD-10-CM | POA: Diagnosis not present

## 2017-02-27 DIAGNOSIS — G800 Spastic quadriplegic cerebral palsy: Secondary | ICD-10-CM | POA: Diagnosis not present

## 2017-02-27 DIAGNOSIS — M419 Scoliosis, unspecified: Secondary | ICD-10-CM | POA: Diagnosis not present

## 2017-03-02 DIAGNOSIS — G800 Spastic quadriplegic cerebral palsy: Secondary | ICD-10-CM | POA: Diagnosis not present

## 2017-03-02 DIAGNOSIS — M419 Scoliosis, unspecified: Secondary | ICD-10-CM | POA: Diagnosis not present

## 2017-03-15 DIAGNOSIS — G800 Spastic quadriplegic cerebral palsy: Secondary | ICD-10-CM | POA: Diagnosis not present

## 2017-03-15 DIAGNOSIS — M419 Scoliosis, unspecified: Secondary | ICD-10-CM | POA: Diagnosis not present

## 2017-03-16 ENCOUNTER — Ambulatory Visit
Admission: RE | Admit: 2017-03-16 | Discharge: 2017-03-16 | Disposition: A | Discharge status: Home / Self Care | Payer: Medicare Other | Source: Ambulatory Visit | Attending: Family Medicine | Admitting: Family Medicine

## 2017-03-16 ENCOUNTER — Telehealth: Payer: Self-pay | Admitting: Family Medicine

## 2017-03-16 ENCOUNTER — Ambulatory Visit (INDEPENDENT_AMBULATORY_CARE_PROVIDER_SITE_OTHER): Payer: Medicare Other | Admitting: Family Medicine

## 2017-03-16 VITALS — BP 118/62 | HR 83 | Temp 97.5°F | Wt 135.8 lb

## 2017-03-16 DIAGNOSIS — R05 Cough: Secondary | ICD-10-CM | POA: Diagnosis present

## 2017-03-16 DIAGNOSIS — R059 Cough, unspecified: Secondary | ICD-10-CM

## 2017-03-16 DIAGNOSIS — J301 Allergic rhinitis due to pollen: Secondary | ICD-10-CM | POA: Diagnosis not present

## 2017-03-16 DIAGNOSIS — H6123 Impacted cerumen, bilateral: Secondary | ICD-10-CM

## 2017-03-16 MED ORDER — MONTELUKAST SODIUM 10 MG PO TABS: 10.0000 mg | ORAL_TABLET | Freq: Every day | ORAL | 11 refills | Status: DC

## 2017-03-16 NOTE — Assessment & Plan Note (Signed)
Singulair resumed, 10mg  daily.  Continue Astelin nasal spray daily.  Recommend at minimum twice daily nasal saline for next week.  Discussed to follow with once daily dosing of Astelin.

## 2017-03-16 NOTE — Progress Notes (Signed)
    Subjective: CC: allergies HPI: Edward Alvarado is a 36 y.o. male presenting to clinic today for:  1. Cough Patient's mother called on 3/29.  She had spoken w/ patient's allergist, who recommended that patient start Azelastine nasal spray for allergies and discontinue Singulair and Claritin.  Mother reports that since that time symptoms have persisted and have been ongoing for about 6 weeks.  She reports that she actually spoke to allergist again, and he wanted patient to stay on Singulair.  They would like to resume this if possible.  Caretaker from group home reports that patient has had cough intermittently, most notable at night time.  They deny fevers, chills, vomiting, diarrhea.  He has lost weight but this has been intentional through dietary changes.  Endorses rhinorrhea and congestion.  Currently only using nasal saline once per week.  Social Hx reviewed: non smoker. MedHx, medications and allergies reviewed.  Please see EMR. ROS: Per HPI  Objective: Office vital signs reviewed. BP 118/62   Pulse 83   Temp 97.5 F (36.4 C) (Oral)   Wt 135 lb 12.8 oz (61.6 kg)   SpO2 95%   BMI 20.05 kg/m   Physical Examination:  General: Awake, alert, well nourished, comes in wheelchair accompanied by mother and caretaker. No acute distress HEENT: Normal    Neck: No masses palpated. No lymphadenopathy    Ears: Tympanic membranes occluded by cerumen.    Eyes: sclera white, no ocular discharge    Nose: nasal turbinates moist, moderate mucus nasal discharge, turbinates pale and edematous    Throat: moist mucus membranes, no erythema, no tonsillar exudate.  Airway is patent Cardio: regular rate and rhythm, S1S2 heard, no murmurs appreciated Pulm:  Lung exam technically difficult due to scoliotic curve but seemingly clear to auscultation bilaterally, no wheezes, rhonchi or rales; normal work of breathing on room air  Assessment/ Plan: 36 y.o. male   Cough Likely due to post nasal drip  from allergies.  However, given duration of symptoms, will obtain CXR.  DDx includes sinus infection.  However, no purulence noted and not having fevers.  DG Chest 2 View; Future.  Will call with results.  Discussed with mother and caretaker that if NO improvement seen by Monday, will plan to treat empirically for sinus infection.  Bilateral impacted cerumen Recommended daily use of Debrox for next week.  Can consider return for irrigation of ears.  Allergic rhinitis Singulair resumed, 10mg  daily.  Continue Astelin nasal spray daily.  Recommend at minimum twice daily nasal saline for next week.  Discussed to follow with once daily dosing of Astelin.     Return precautions reviewed.  Follow up prn.  Raliegh IpAshly M Gottschalk, DO PGY-3, [facility]

## 2017-03-16 NOTE — Telephone Encounter (Signed)
Reviewed negative xray with mother.    Ashly M. Nadine CountsGottschalk, DO PGY-3, [facility]

## 2017-03-16 NOTE — Patient Instructions (Addendum)
I have restarted the Singulair 10mg  daily.  Claritin has been discontinued.  I recommend that he use nasal saline sprays twice daily for at least the next week in addition to the once daily Azelastine nasal spray daily.   Use the nasal saline first, then can use the allergy spray. This will help irrigate his nostrils.    I have ordered a chest xray to evaluate for possible pneumonia, though my suspicion is not high for this in the absence of a fever.    He may have a sinus infection and I will likely send in an antibiotic for this if he does not have any improvement with the addition of Singulair and nasal saline rinses by Monday.  Call me Monday and I will send this in if needed.   Nasal Allergies Nasal allergies are a reaction to allergens in the air. Allergens are particles in the air that cause your body to have an allergic reaction. Nasal allergies are not passed from person to person (are not contagious). They cannot be cured, but they can be controlled. What are the causes? Seasonal nasal allergies (hay fever) are caused by pollen allergens that come from grasses, trees, and weeds. Year-round nasal allergies (perennial allergic rhinitis) are caused by allergens such as house dust mites, pet dander, and mold spores. What increases the risk? The following factors may make you more likely to develop this condition:  Having certain health conditions. These include:  Other types of allergies, such as food allergies.  Asthma.  Eczema.  Having a close relative who has allergies or asthma.  Exposure to house dust, pollen, dander, or other allergens at home or at work.  Exposure to air pollution or secondhand smoke when you were a child. What are the signs or symptoms? Symptoms of this condition include:  Sneezing.  Runny nose or stuffy nose (congestion).  Watery (tearing) eyes.  Itchy eyes, nose, mouth, throat, skin, or other area.  Sore throat.  Headache.  Decreased sense of  smell or taste.  Fatigue. This may occur if you have trouble sleeping due to allergies.  Swollen eyelids. How is this diagnosed? This condition is diagnosed with a medical history and physical exam. Allergy testing may be done to determine exactly what triggers your nasal allergies. How is this treated? There is no cure for nasal allergies. Treatment focuses on controlling your symptoms, and it may include:  Medicines that block allergy symptoms. These may include allergy shots, nasal sprays, and oral antihistamines.  Avoiding the allergen. Follow these instructions at home:  Avoid the allergen that is causing your symptoms, if possible.  Keep windows closed. If possible, use air conditioning when pollen counts are high.  Do not use fans in your home.  Do not hang clothes outside to dry.  Wear sunglasses to keep pollen out of your eyes.  Wash your hands right away after you touch household pets.  Take over-the-counter and prescription medicines only as told by your health care provider.  Keep all follow-up visits as told by your health care provider. This is important. Contact a health care provider if:  You have a fever.  You develop a cough that does not go away (is persistent).  You start to wheeze.  Your symptoms do not improve with treatment.  You have thick nasal discharge.  You start to have nosebleeds. Get help right away if:  Your tongue or your lips are swollen.  You have trouble breathing.  You feel light-headed or you  feel like you are going to faint.  You have cold sweats. This information is not intended to replace advice given to you by your health care provider. Make sure you discuss any questions you have with your health care provider. Document Released: 10/31/2005 Document Revised: 04/04/2016 Document Reviewed: 05/13/2015 Elsevier Interactive Patient Education  2017 [employer].

## 2017-03-27 ENCOUNTER — Telehealth: Payer: Self-pay | Admitting: Family Medicine

## 2017-03-27 NOTE — Telephone Encounter (Signed)
Yes.  It is fine to continue using.

## 2017-03-27 NOTE — Telephone Encounter (Signed)
Pt mother informed. Sunday SpillersSharon T Keyon Winnick, CMA

## 2017-03-27 NOTE — Telephone Encounter (Signed)
Mother is calling because her son takes Astelin and the cap and the clip have been lost. She is wondering is this still okay to use? If not can we call in another refill for her son. Please call her and let her know what she is to do. jw

## 2017-04-19 ENCOUNTER — Other Ambulatory Visit: Payer: Self-pay | Admitting: Family Medicine

## 2017-05-09 ENCOUNTER — Ambulatory Visit (INDEPENDENT_AMBULATORY_CARE_PROVIDER_SITE_OTHER): Payer: Medicare Other | Admitting: Family Medicine

## 2017-05-09 ENCOUNTER — Encounter: Payer: Self-pay | Admitting: Family Medicine

## 2017-05-09 VITALS — BP 114/82 | HR 70 | Temp 98.6°F | Ht 69.0 in | Wt 132.2 lb

## 2017-05-09 DIAGNOSIS — G800 Spastic quadriplegic cerebral palsy: Secondary | ICD-10-CM

## 2017-05-09 DIAGNOSIS — R634 Abnormal weight loss: Secondary | ICD-10-CM | POA: Diagnosis not present

## 2017-05-09 DIAGNOSIS — H6123 Impacted cerumen, bilateral: Secondary | ICD-10-CM | POA: Diagnosis not present

## 2017-05-09 DIAGNOSIS — Z961 Presence of intraocular lens: Secondary | ICD-10-CM | POA: Diagnosis not present

## 2017-05-09 DIAGNOSIS — H401332 Pigmentary glaucoma, bilateral, moderate stage: Secondary | ICD-10-CM | POA: Diagnosis not present

## 2017-05-09 NOTE — Progress Notes (Signed)
    Subjective: CC: med rec HPI: Edward Alvarado is a 36 y.o. male presenting to clinic today for:  Patient presents to clinic today for med rec/ forms.  His caregiver, mother and ASL interpreter are present for evaluation.  Mother voices concern over patient's weight loss.  His caregiver reports that his is somewhat of a picky eater but she thinks his diet is adequate in protein.  He has completed PT/OT. His caregiver is unsure frequency of formal strength training but she reports regular weekly physical activity within the program.  He continues to perform his own transfers without difficulty.  Additionally, patient here for bilateral cerumen impaction appreciated at last visit.  Last Debrox use >1 month ago.  His caregiver reports bilateral ear tugging.  He has a long h/o recurrent cerumen buildup.  Social Hx reviewed: non smoker. MedHx, medications and allergies reviewed.  Please see EMR. ROS: Per HPI  Objective: Office vital signs reviewed. BP 114/82   Pulse 70   Temp 98.6 F (37 C) (Oral)   Ht 5' 9" (1.753 m)   Wt 132 lb 3.2 oz (60 kg)   SpO2 97%   BMI 19.52 kg/m   Physical Examination:  General: Awake, alert, thin, wheelchair bound male, No acute distress HEENT: Normal    Neck: No masses palpated. No lymphadenopathy    Ears: Tympanic membranes initially obscured by cerumen bilaterally.  Ears irrigated and TMs intact, normal light reflex, mild erythema at apex of canal on right, no bulging Cardio: regular rate and rhythm, S1S2 heard, no murmurs appreciated Pulm: clear to auscultation bilaterally, no wheezes, rhonchi or rales; normal work of breathing on room air MSK: right UE with mild contracture, decreased muscle tone  Assessment/ Plan: 36 y.o. male   Loss of weight About a 3# weight loss since last visit 3 months ago.  He is at increased risk of muscle wasting in the setting of CP.  I have placed a referral to nutrition for formal evaluation of nutritional needs.  I'd  like to make sure that his nutritional needs are being met.  2017 CMP w/ albumin 3.9.  For now, will advise to drink 1 protein shake daily.  Encourage daily strength training.  Encouraged fruits, veggies and lean proteins.  Follow up in July for PE.  Cerebral palsy Med rec performed.  Forms filled out for group home and returned to caregiver.  Copies of AVS provided to mother as well.  Cerumen impaction Resolved after irrigation of ears.  No evidence of infection.  Encouraged Debrox prn.  Follow up in July for annual exam.  Ashly M Gottschalk, DO PGY-3, [facility]  

## 2017-05-09 NOTE — Assessment & Plan Note (Signed)
Resolved after irrigation of ears.  No evidence of infection.  Encouraged Debrox prn.

## 2017-05-09 NOTE — Assessment & Plan Note (Signed)
Med rec performed.  Forms filled out for group home and returned to caregiver.  Copies of AVS provided to mother as well.

## 2017-05-09 NOTE — Assessment & Plan Note (Addendum)
About a 3# weight loss since last visit 3 months ago.  He is at increased risk of muscle wasting in the setting of CP.  I have placed a referral to nutrition for formal evaluation of nutritional needs.  I'd like to make sure that his nutritional needs are being met.  2017 CMP w/ albumin 3.9.  For now, will advise to drink 1 protein shake daily.  Encourage daily strength training.  Encouraged fruits, veggies and lean proteins.  Follow up in July for PE.

## 2017-05-09 NOTE — Patient Instructions (Signed)
I have placed a referral to nutrition for formal evaluation.  I recommend a daily nutritional supplement to help maintain weight and muscle mass.

## 2017-05-16 ENCOUNTER — Other Ambulatory Visit: Payer: Self-pay | Admitting: Family Medicine

## 2017-05-16 ENCOUNTER — Other Ambulatory Visit: Payer: Medicare Other

## 2017-05-16 DIAGNOSIS — D696 Thrombocytopenia, unspecified: Secondary | ICD-10-CM

## 2017-05-16 DIAGNOSIS — E785 Hyperlipidemia, unspecified: Secondary | ICD-10-CM | POA: Diagnosis not present

## 2017-05-17 LAB — CMP14+EGFR
A/G RATIO: 1.9 (ref 1.2–2.2)
ALK PHOS: 67 IU/L (ref 39–117)
ALT: 14 IU/L (ref 0–44)
AST: 12 IU/L (ref 0–40)
Albumin: 4.4 g/dL (ref 3.5–5.5)
BUN/Creatinine Ratio: 20 (ref 9–20)
BUN: 17 mg/dL (ref 6–20)
Bilirubin Total: 1.4 mg/dL — ABNORMAL HIGH (ref 0.0–1.2)
CALCIUM: 9.3 mg/dL (ref 8.7–10.2)
CO2: 24 mmol/L (ref 20–29)
Chloride: 103 mmol/L (ref 96–106)
Creatinine, Ser: 0.84 mg/dL (ref 0.76–1.27)
GFR calc Af Amer: 129 mL/min/{1.73_m2} (ref >59)
GFR, EST NON AFRICAN AMERICAN: 112 mL/min/{1.73_m2} (ref >59)
Globulin, Total: 2.3 g/dL (ref 1.5–4.5)
Glucose: 85 mg/dL (ref 65–99)
POTASSIUM: 4.6 mmol/L (ref 3.5–5.2)
Sodium: 141 mmol/L (ref 134–144)
Total Protein: 6.7 g/dL (ref 6.0–8.5)

## 2017-05-17 LAB — CBC
Hematocrit: 46.6 % (ref 37.5–51.0)
Hemoglobin: 15.8 g/dL (ref 13.0–17.7)
MCH: 31 pg (ref 26.6–33.0)
MCHC: 33.9 g/dL (ref 31.5–35.7)
MCV: 92 fL (ref 79–97)
PLATELETS: 115 10*3/uL — AB (ref 150–379)
RBC: 5.09 x10E6/uL (ref 4.14–5.80)
RDW: 13.2 % (ref 12.3–15.4)
WBC: 6.4 10*3/uL (ref 3.4–10.8)

## 2017-05-17 LAB — LDL CHOLESTEROL, DIRECT: LDL DIRECT: 107 mg/dL — AB (ref 0–99)

## 2017-05-17 LAB — TSH: TSH: 1.11 u[IU]/mL (ref 0.450–4.500)

## 2017-05-24 ENCOUNTER — Encounter: Payer: Self-pay | Admitting: Family Medicine

## 2017-05-24 ENCOUNTER — Ambulatory Visit (INDEPENDENT_AMBULATORY_CARE_PROVIDER_SITE_OTHER): Payer: Medicare Other | Admitting: Family Medicine

## 2017-05-24 VITALS — BP 110/82 | HR 75 | Temp 97.7°F | Ht 69.0 in | Wt 130.2 lb

## 2017-05-24 DIAGNOSIS — E559 Vitamin D deficiency, unspecified: Secondary | ICD-10-CM

## 2017-05-24 DIAGNOSIS — Z7689 Persons encountering health services in other specified circumstances: Secondary | ICD-10-CM | POA: Insufficient documentation

## 2017-05-24 DIAGNOSIS — G809 Cerebral palsy, unspecified: Secondary | ICD-10-CM | POA: Diagnosis not present

## 2017-05-24 DIAGNOSIS — Z0000 Encounter for general adult medical examination without abnormal findings: Secondary | ICD-10-CM

## 2017-05-24 DIAGNOSIS — R634 Abnormal weight loss: Secondary | ICD-10-CM

## 2017-05-24 DIAGNOSIS — D696 Thrombocytopenia, unspecified: Secondary | ICD-10-CM | POA: Diagnosis not present

## 2017-05-24 DIAGNOSIS — Z993 Dependence on wheelchair: Secondary | ICD-10-CM | POA: Diagnosis not present

## 2017-05-24 DIAGNOSIS — E785 Hyperlipidemia, unspecified: Secondary | ICD-10-CM | POA: Diagnosis not present

## 2017-05-24 DIAGNOSIS — Q150 Congenital glaucoma: Secondary | ICD-10-CM | POA: Diagnosis not present

## 2017-05-24 NOTE — Assessment & Plan Note (Signed)
Platelets to 115 today. Has chronically low platelets. -Can recheck platelets in 3-6 months -If platelets continue to drop, can consider referral to hematology

## 2017-05-24 NOTE — Progress Notes (Signed)
Subjective:  Edward Alvarado is a 36 y.o. year old male with history of cerebral palsy, deafness, glaucoma, scoliosis, vitamin D deficiency , who presents to office today for an annual physical examination.  Patient is here today with his mother, his group caregiver, and a sign language interpreter  Concerns today include:  1. Weight loss: Has had some weight loss over the last 3 months. Saw Dr. Nadine CountsGottschalk on June 26 and was referred to nutritionist and told to drink one protein shake daily. Patient has an appointment with the nutritionist on July 20. Notes that he has been eating less fat because he had some high cholesterol. He is drinking the daily protein shake without difficulty. Denies any trouble swallowing. No nausea, vomiting, abdominal pain, diarrhea, constipation. No blood in stool or urine.  Review of Systems: Per HPI. All other systems reviewed and are negative.  General Healthcare: Medication Compliance: Yes  Dx Hypertension: No  Dx Hyperlipidemia: Yes Diabetes: No  Dx Obesity: No  Weight Loss: Yes  Physical Activity: Difficult to cerebral palsy  Urinary Incontinence: No   Menstrual hx: Not applicable  Social:  reports that he has never smoked. He has never used smokeless tobacco. Driving: Does not drive  Alcohol Use: non- Tobacco none  Other Drugs: none  Support and Life at Home: good support with his mother. Lives in a group home with good support there as well  Advanced Directives: None  Work: None  There are no preventive care reminders to display for this patient.  Past Medical History Past Medical History:  Diagnosis Date  . Allergy   . Cerebral palsy (HCC)    W/C BOUND-ABLE TO TRANSFER W/C TO BED -DOES REQUIRE SOME HELP IN BATHROOM WITH CLOTHES--LIVES IN GROUP HOME CALLED [name]-HIS PARENTS ARE HIS LEGAL GUARDIANS  . Complication of anesthesia    [facility] WAS USED AND PT EXPERIENCED AIRWAY / OXYGENATION PROBLEMS.  PT HAD  SUBSEQUENT SURGERIES  AFTER JAN 2012 AT [facility] AND PARENTS TOLD ET USED AND PT DID FINE.  PT HAD NO ANESTHESIA PROBLEMS WITH SURGERIES PRIOR TO THE JAN 2012 SURGERY.  . Deafness    hearing aids-NOT WEARING AT PRESENT TIME; ABLE TO DO SIGN LANGUAGE WITH HIS PARENTS  . Glaucoma   . Hyperlipidemia   . Kidney stone   . Platelets decreased (HCC)   . Prepatellar bursitis    Hospitalized in 2012 for sepsis secondary to bursisiitis  . Scoliosis    Patient Active Problem List   Diagnosis Date Noted  . Annual physical exam 05/24/2017  . Vitamin D insufficiency 05/29/2015  . Cerumen impaction 09/02/2013  . Perioral dermatitis 05/08/2013  . Loss of weight 11/07/2011  . Hyperlipidemia 07/18/2007  . THROMBOCYTOPENIA NOS 06/26/2007  . Cerebral palsy (HCC) 06/26/2007  . 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 Allergic rhinitis 06/26/2007  . SCOLIOSIS 06/26/2007    Medications- reviewed and updated Current Outpatient Prescriptions  Medication Sig Dispense Refill  . azelastine (ASTELIN) 0.1 % nasal spray Place 1 spray into both nostrils 2 (two) times daily. Use in each nostril as directed 30 mL 12  . brimonidine-timolol (COMBIGAN) 0.2-0.5 % ophthalmic solution Place 1 drop into both eyes every 12 (twelve) hours.    . carbamide peroxide (DEBROX) 6.5 % otic solution Place 5 drops into both ears once a week. 15 mL 5  . latanoprost (XALATAN) 0.005 % ophthalmic solution Place 1 drop into the  left eye at bedtime.    . montelukast (SINGULAIR) 10 MG tablet Take 1 tablet (10 mg total) by mouth at bedtime. 30 tablet 11  . multivitamin (ONE-A-DAY MEN'S) TABS tablet Take 1 tablet by mouth daily. 30 tablet 11  . ondansetron (ZOFRAN-ODT) 4 MG disintegrating tablet Take ONE (1) tablet by mouth every 8 hours as needed for nausea 15 tablet 0  . pravastatin (PRAVACHOL) 20 MG tablet TAKE 1 TABLET BY MOUTH DAILY. 30 tablet 11  . Skin Protectants, Misc.  (EUCERIN) cream Apply topically as needed (dry skin). Generic okay 397 g 0  . sodium chloride (OCEAN) 0.65 % SOLN nasal spray Place 1 spray into both nostrils as needed for congestion. As needed for allergy symptoms 1 Bottle 12  . Vitamin D, Cholecalciferol, 400 units TABS Take 1 tablet by mouth daily. 90 tablet 4   No current facility-administered medications for this visit.     Objective: BP 110/82   Pulse 75   Temp 97.7 F (36.5 C) (Oral)   Ht 5\' 9"  (1.753 m)   Wt 130 lb 3.2 oz (59.1 kg)   SpO2 98%   BMI 19.23 kg/m  Gen: In no acute distress, alert, cooperative with exam, well groomed, wheelchair bound, nonverbal HEENT: NCAT, EOMI, PERRL, does have erythema of the sclera bilaterally CV: Regular rate and rhythm, normal S1/S2, no murmur Resp: Clear to auscultation bilaterally, no wheezes, non-labored Abd: Soft, Non Tender, Non Distended, bowel sounds present, no guarding or organomegaly Ext: No edema, warm and well perfused Back: Well-healed midline incisional scar on the lower back, scoliosis seen Neuro: Alert and oriented, decreased muscle tone throughout, right upper extremity with mild contracture, wheelchair bound, sensation grossly intact throughout Psych: Normal mood and affect  Assessment/Plan:  Cerebral palsy Doing well and patient is stable neurologically per mother's report. He is wheelchair bound. Discussed referring to neurology to establish care here. The last time he saw a neurologist was many years ago in [place]. -Refer to neurology -Forms filled out for him to be in group home -May benefit from tailored physical therapy  Vitamin D insufficiency History of vitamin D deficiency. Takes vitamin D supplementation daily -We'll place future order for vitamin D  OAG (open angle glaucoma), juvenile Follows with ophthalmology. Uses Combigan and latanoprost eyedrops. -Continue current medications -Continue to follow up with ophthalmology  Loss of weight 8 pound  weight loss in the last 4 months. Likely secondary to change in diet to decrease cholesterol as well as progressive muscle wasting. Does not appear to be any mechanical issues to eating -Follow-up with nutritionist on July 20 -Continue daily protein shake -Follow-up in one month -Can investigate other causes of weight loss in he continues to drop weight despite nutrition supplementation  Hyperlipidemia Most recent LDL at 107. -Continue pravastatin 20 mg daily  THROMBOCYTOPENIA NOS Platelets to 115 today. Has chronically low platelets. -Can recheck platelets in 3-6 months -If platelets continue to drop, can consider referral to hematology  Annual physical exam Patient doing well besides weight loss (see other assessment and plan). All health maintenance items up-to-date. -Return in 1 year for next wellness exam   Orders Placed This Encounter  Procedures  . VITAMIN D 25 Hydroxy (Vit-D Deficiency, Fractures)    Standing Status:   Future    Standing Expiration Date:   05/24/2018  . Ambulatory referral to Neurology    Referral Priority:   Routine    Referral Type:   Consultation    Referral Reason:  Specialty Services Required    Requested Specialty:   Neurology    Number of Visits Requested:   1    Anders Simmonds, MD [facility], PGY-3

## 2017-05-24 NOTE — Assessment & Plan Note (Addendum)
Doing well and patient is stable neurologically per mother's report. He is wheelchair bound. Discussed referring to neurology to establish care here. The [place] -Refer to neurology -Forms filled out for him to be in group home -May benefit from tailored physical therapy

## 2017-05-24 NOTE — Assessment & Plan Note (Signed)
8 pound weight loss in the last 4 months. Likely secondary to change in diet to decrease cholesterol as well as progressive muscle wasting. Does not appear to be any mechanical issues to eating -Follow-up with nutritionist on July 20 -Continue daily protein shake -Follow-up in one month -Can investigate other causes of weight loss in he continues to drop weight despite nutrition supplementation

## 2017-05-24 NOTE — Patient Instructions (Signed)
Thank you for coming in today, it was so nice to see you! Today we talked about:    Cerebral palsy: I placed a referral for neurologist. So will call you to schedule this.  Weight loss; I look forward to seeing with a nutritionist has to say.  your physical exam is good today  Come into the lab when you are able to check your vitamin D  Please follow up in 1 month to check on your weight. You can schedule this appointment at the front desk before you leave or call the clinic.   If you have any questions or concerns, please do not hesitate to call the office at [phone]. You can also message me directly via MyChart.   Sincerely,  Anders Simmondshristina Eldora Napp, MD

## 2017-05-24 NOTE — Assessment & Plan Note (Signed)
Follows with ophthalmology. Uses Combigan and latanoprost eyedrops. -Continue current medications -Continue to follow up with ophthalmology

## 2017-05-24 NOTE — Assessment & Plan Note (Signed)
History of vitamin D deficiency. Takes vitamin D supplementation daily -We'll place future order for vitamin D

## 2017-05-24 NOTE — Assessment & Plan Note (Signed)
Patient doing well besides weight loss (see other assessment and plan). All health maintenance items up-to-date. -Return in 1 year for next wellness exam

## 2017-05-24 NOTE — Assessment & Plan Note (Signed)
Most recent LDL at 107. -Continue pravastatin 20 mg daily

## 2017-06-08 ENCOUNTER — Encounter: Payer: Self-pay | Admitting: Registered"

## 2017-06-08 ENCOUNTER — Encounter: Payer: Medicare Other | Attending: Family Medicine | Admitting: Registered"

## 2017-06-08 DIAGNOSIS — G800 Spastic quadriplegic cerebral palsy: Secondary | ICD-10-CM | POA: Insufficient documentation

## 2017-06-08 DIAGNOSIS — Z713 Dietary counseling and surveillance: Secondary | ICD-10-CM | POA: Diagnosis not present

## 2017-06-08 DIAGNOSIS — R634 Abnormal weight loss: Secondary | ICD-10-CM | POA: Insufficient documentation

## 2017-06-08 NOTE — Patient Instructions (Addendum)
Consider getting protein shakes (20 gram) and 160 - 200 cal to supplement meals. Work with likes and dislikes to adjust meals to ensure he is getting enough calories and maintains a balanced diet. Nuts are a great way to get fiber in the diet.

## 2017-06-08 NOTE — Progress Notes (Signed)
Medical Nutrition Therapy:  Appt start time: 1410 end time:  1500.  Assessment:  Primary concerns today: This patient is accompanied in the office by his mother (guardian), group home provider and ASL interpreter. Patients mother states she is concerned that he is losing weight and reports that her doctor expressed concern about muscle loss.   Questions to patient need to be simple yes/no answers and food models or other visual aids are helpful.  Provider reports that when the patient arrived at the group home in 2012 he was underweight but then he gained weight and his cholesterol also went up. Provider reports that they changed the diet, cut out honey bun and created a special diet for patient which included whole grains, low-fat dairy, and a lot of fruits and vegetables.  Preferred Learning Style:   No preference indicated   Learning Readiness: (readiness of provider to make changes to pt diet plan)  Ready  MEDICATIONS: reviewed (including pravastatin)   DIETARY INTAKE:  Usual eating pattern includes 3 meals and 1 (protein shake) snacks per day.  24-hr recall:  B ( AM): oatmeal, 1% milk  Snk ( AM): premier protein shake  L ( PM): Chicken sandwich (eats meat and will discard rest) Snk ( PM): none D ( PM): Chicken, ww pasta, broccoli Snk ( PM):  Beverages: water  Usual physical activity: cerebral palsy (HCC)  Estimated energy needs:  2200 calories 248 g carbohydrates 165 g protein 61 g fat  Progress Towards Goal(s):  In progress.   Nutritional Diagnosis:  -3.2 Unintentional weight loss As related to dislike of and not eating some foods offered at meal times.  As evidenced by dietary intake reported by provider.    Intervention:  Nutrition Education. Discussed importance of balance eating. Discussed sources of fiber. Discussed appropriate protein amounts and ways to estimate proper serving sizes to ensure enough protein intake.  Plan: Consider getting protein shakes  (20 gram) and 160 - 200 cal to supplement meals. Work with likes and dislikes to adjust meals to ensure he is getting enough calories and maintains a balanced diet. Nuts are a great way to get fiber in the diet.  Teaching Method Utilized:  Visual Auditory  Handouts given during visit include:  none  Barriers to learning/adherence to lifestyle change: needs help of provides to make diet choices.  Demonstrated degree of understanding via:  Teach Back   Monitoring/Evaluation:  Dietary intake, exercise, and body weight prn.

## 2017-06-13 ENCOUNTER — Other Ambulatory Visit: Payer: Self-pay | Admitting: Family Medicine

## 2017-06-13 DIAGNOSIS — E559 Vitamin D deficiency, unspecified: Secondary | ICD-10-CM

## 2017-06-15 ENCOUNTER — Other Ambulatory Visit: Payer: Self-pay | Admitting: Family Medicine

## 2017-06-15 DIAGNOSIS — E559 Vitamin D deficiency, unspecified: Secondary | ICD-10-CM

## 2017-07-14 ENCOUNTER — Ambulatory Visit (INDEPENDENT_AMBULATORY_CARE_PROVIDER_SITE_OTHER): Payer: Medicare Other | Admitting: Neurology

## 2017-07-14 ENCOUNTER — Encounter: Payer: Self-pay | Admitting: Neurology

## 2017-07-14 VITALS — BP 119/84 | HR 82 | Ht 69.0 in

## 2017-07-14 DIAGNOSIS — G800 Spastic quadriplegic cerebral palsy: Secondary | ICD-10-CM | POA: Diagnosis not present

## 2017-07-14 NOTE — Patient Instructions (Signed)
Overall you are doing well but I do want to suggest a few things today:   Remember to drink plenty of fluid, eat healthy meals and do not skip any meals. Try to eat protein with a every meal and eat a healthy snack such as fruit or nuts in between meals. Try to keep a regular sleep-wake schedule and try to exercise daily, particularly in the form of walking, 20-30 minutes a day, if you can.   I would like to see you back in 1 year, sooner if we need to. Please call us with any interim questions, concerns, problems, updates or refill requests.   Our phone number is [phone]. We also have an after hours call service for urgent matters and there is a physician on-call for urgent questions. For any emergencies you know to call 911 or go to the nearest emergency room

## 2017-07-14 NOTE — Progress Notes (Signed)
[facility]    Provider:  Dr Lucia GaskinsAhern Referring Provider: Beaulah DinningGambino, Christina M, MD Primary Care Physician:  Beaulah DinningGambino, Christina M, MD  CC:  Cerebral palsy  HPI:  Edward Alvarado is a 36 y.o. male here as a referral from Dr. Jonathon JordanGambino for cerebral palsy. His caregiver([name]), mother([name])and ASL interpreter(Lori) present for evaluation.PMHx spastic quadriplegia, can respond yes/no, pigmentary glaucoma of both eyes, deafness, scoliosis.  Has been losing weight recently.  Lives in a group home, has completed PT/OT, weekly physical activity within the program, he continues to perform his transfers without difficulty. Has not seen a neurologist for 25 years, no changes. They just want an updated neurologic exam. He is very happy, shy at first but mood is good. He is more independent than people think, he is into computers and video games, he volunteers at a computer refurbishing, he likes video games, loves arcades and [employer], he has been stable. He participates in special Olympics, plays bocci ball. He needs help transfering and bathing and showing, bathroom. He dresses on his own. Sometimes needs help cutting meat but feeds himself. Left hand is very functional moreso than the right hand. He has contractures. They had talked about b[facility] in the past at [facility] but declined. No seizures. Sleeping well. Weight loss recently and he has been to a nutritionist. No behavior problems. 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   Reviewed notes, labs and imaging from outside physicians, which showed:  Reviewed primary care notes. He has had some weight loss over the last 3 months. He was referred to a nutritionist. Weight loss was in the setting of eating less and trying to lower his cholesterol. Denies any trouble swallowing. Seizures. He is wheelchair-bound but has been neurologically stable for years. [place] He is living in  a group home. He was given vitamin D supplementation daily. He follows with ophthalmology for open-angle glaucoma. Last LDL was 107, he is on pravastatin.  Review of Systems: Patient complains of symptoms per HPI as well as the following symptoms: No CP, no SOB. Pertinent negatives and positives per HPI. All others negative.   Social History   Social History  . Marital status: Single    Spouse name: N/A  . Number of children: N/A  . Years of education: N/A   Occupational History  . Not on file.   Social History Main Topics  . Smoking status: Never Smoker  . Smokeless tobacco: Never Used  . Alcohol use No  . Drug use: No  . Sexual activity: Not on file   Other Topics Concern  . Not on file   Social History Narrative   Lives in a group home with 2 other High Functioning CP young men.  24/7 supervision   Parents involved in care   Left handed     Family History  Problem Relation Age of Onset  . Asthma Mother   . Kidney Stones Mother   . Osteoporosis Mother   . Hypertension Mother   . Hyperlipidemia Mother   . Hyperlipidemia Father   . Hodgkin's lymphoma Paternal Uncle   . Alzheimer's disease Paternal Grandmother   . Stroke Paternal Grandmother   . Asthma Maternal Grandmother   . Stroke Paternal Grandfather     Past Medical History:  Diagnosis Date  . Allergy   . Atrophy, cortical    Lower left ventrical  . Cerebral palsy (HCC)  W/C BOUND-ABLE TO TRANSFER W/C TO BED -DOES REQUIRE SOME HELP IN BATHROOM WITH CLOTHES--LIVES IN GROUP HOME CALLED [name]-HIS PARENTS ARE HIS LEGAL GUARDIANS  . Complication of anesthesia    JAN 2012 EYE SURGERY AT [facility] WAS USED AND PT EXPERIENCED AIRWAY / OXYGENATION PROBLEMS.  PT HAD SUBSEQUENT SURGERIES  AFTER JAN 2012 AT [facility] AND PARENTS TOLD ET USED AND PT DID FINE.  PT HAD NO ANESTHESIA PROBLEMS WITH SURGERIES PRIOR TO THE JAN 2012 SURGERY.  . Deafness    hearing aids-NOT WEARING AT PRESENT TIME; ABLE TO DO SIGN  LANGUAGE WITH HIS PARENTS  . Glaucoma   . Hyperlipidemia   . Kidney stone   . Platelets decreased (HCC)   . Prepatellar bursitis    Hospitalized in 2012 for sepsis secondary to bursisiitis  . Scoliosis     Past Surgical History:  Procedure Laterality Date  . ADENOIDECTOMY    . CYSTOSCOPY/RETROGRADE/URETEROSCOPY  10/01/2012   Procedure: CYSTOSCOPY/RETROGRADE/URETEROSCOPY;  Surgeon: Crecencio Mc, MD;  Location: [facility];  Service: Urology;  Laterality: Left;  laser lithotripsy stone extraction on left  . eye surgery  2012   [facility]  . EYE SURGERY     4 RIGHT EYE SURGERIES FOR GLAUCOMA AND ONE  RT EYE CATARACT EXTRACTION; LEFT EYE CATARACT EXTRACTION WITH  I STENTPROCEDURE FOR GLAUCOMA  . HOLMIUM LASER APPLICATION  10/01/2012   Procedure: HOLMIUM LASER APPLICATION;  Surgeon: Crecencio Mc, MD;  Location: [facility];  Service: Urology;  Laterality: Left;  . multiple orthopedic procedures    . MYRINGOTOMY    . SELECTIVE RHIZOTOMY PROCEDURE TO HELP REDUCE SPASTICITY AND CONTRACTURES      Current Outpatient Prescriptions  Medication Sig Dispense Refill  . azelastine (ASTELIN) 0.1 % nasal spray Place 1 spray into both nostrils 2 (two) times daily. Use in each nostril as directed 30 mL 12  . brimonidine-timolol (COMBIGAN) 0.2-0.5 % ophthalmic solution Place 1 drop into both eyes every 12 (twelve) hours.    . carbamide peroxide (DEBROX) 6.5 % otic solution Place 5 drops into both ears once a week. 15 mL 5  . latanoprost (XALATAN) 0.005 % ophthalmic solution Place 1 drop into the left eye at bedtime.    . montelukast (SINGULAIR) 10 MG tablet Take 1 tablet (10 mg total) by mouth at bedtime. 30 tablet 11  . multivitamin (ONE-A-DAY MEN'S) TABS tablet Take 1 tablet by mouth daily. 30 tablet 11  . ondansetron (ZOFRAN-ODT) 4 MG disintegrating tablet Take ONE (1) tablet by mouth every 8 hours as needed for nausea 15 tablet 0  . pravastatin (PRAVACHOL) 20 MG tablet TAKE 1 TABLET BY MOUTH DAILY. 30 tablet 11  .  Skin Protectants, Misc. (EUCERIN) cream Apply topically as needed (dry skin). Generic okay 397 g 0  . sodium chloride (OCEAN) 0.65 % SOLN nasal spray Place 1 spray into both nostrils as needed for congestion. As needed for allergy symptoms 1 Bottle 12  . Vitamin D, Cholecalciferol, 400 units TABS Take 1 tablet by mouth daily. 90 tablet 4   No current facility-administered medications for this visit.     Allergies as of 07/14/2017 - Review Complete 07/14/2017  Allergen Reaction Noted  . Afrin [oxymetazoline] Other (See Comments) 12/09/2015  . Betadine [povidone iodine]  08/31/2012  . Iodine      Vitals: BP 119/84 (BP Location: Left Arm, Patient Position: Sitting, Cuff Size: Normal)   Pulse 82   Ht 5\' 9"  (1.753 m)   SpO2 98%  Last Weight:  Wt  Readings from Last 1 Encounters:  05/24/17 130 lb 3.2 oz (59.1 kg)   Last Height:   Ht Readings from Last 1 Encounters:  07/14/17 5\' 9"  (1.753 m)   Physical exam: Exam: Gen: NAD, non-verbal, well nourised, thin, well groomed                     CV: RRR, no MRG. No Carotid Bruits. No peripheral edema, warm, nontender Eyes: Conjunctivae clear without exudates or hemorrhage  Neuro: Detailed Neurologic Exam  Speech:    Non-verbal Cognition:    The patient nods appropriately, follows commmands Cranial Nerves:    The pupils are equal, round, and reactive to light. Attempted fundoscopic exam and could not visualize. Visual fields are impaired finger confrontation. Extraocular movements are intact. Trigeminal sensation is intact and the muscles of mastication are normal. The face shows mild right facial weakness on smile and ptosis. The palate elevates in the midline. Hearing impaired. Can vocalize.Marland Kitchen Shoulder shrug is normal. The tongue has normal motion without fasciculations with left deviation.   Coordination:    No dysmetria  Gait:    Attempted, cannot bear weight  Motor Observation:    Contractures more on the right with arm flexion,  wrist flexion, knee flexion and foot inversion. Tone:    contractures.    Posture:    Posture is normal in wheelchair    Strength:    Spastic quadriplegia     Sensation: intact to LT     Reflex Exam:  DTR's:    Deep tendon reflexes in the upper are brisk and hyporeflexic in the lower extremities.   Toes:    The toes are upgoing bilaterally.   Clonus:    Clonus is absent.       Assessment/Plan:  This is a lovely 36 year old patient here to establish neurologic care. 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 He is nonverbal, uses a wheelchair due to spastic quadriplegia, his hearing impaired and communicates with yes no answers, he can interpret sign language. He has no difficulty with bowel bladder, no seizures, he has been stable neurologically for many years, he appears happy without behavioral difficulties. He lives in a group home. At this point he appears to be very well taken care of and quite happy. We will continue to follow with him yearly.  Naomie Dean, MD  [facility] [address] [place], [place] [address]  Phone [phone] Fax [phone]

## 2017-07-15 IMAGING — DX DG CHEST 2V
2 series · 2 of 2 positions shown · non-contrast
Comparison: 08/02/2007

CLINICAL DATA: Prolonged cough and congestion for 1 month

EXAM:
CHEST  2 VIEW

[view not recorded]
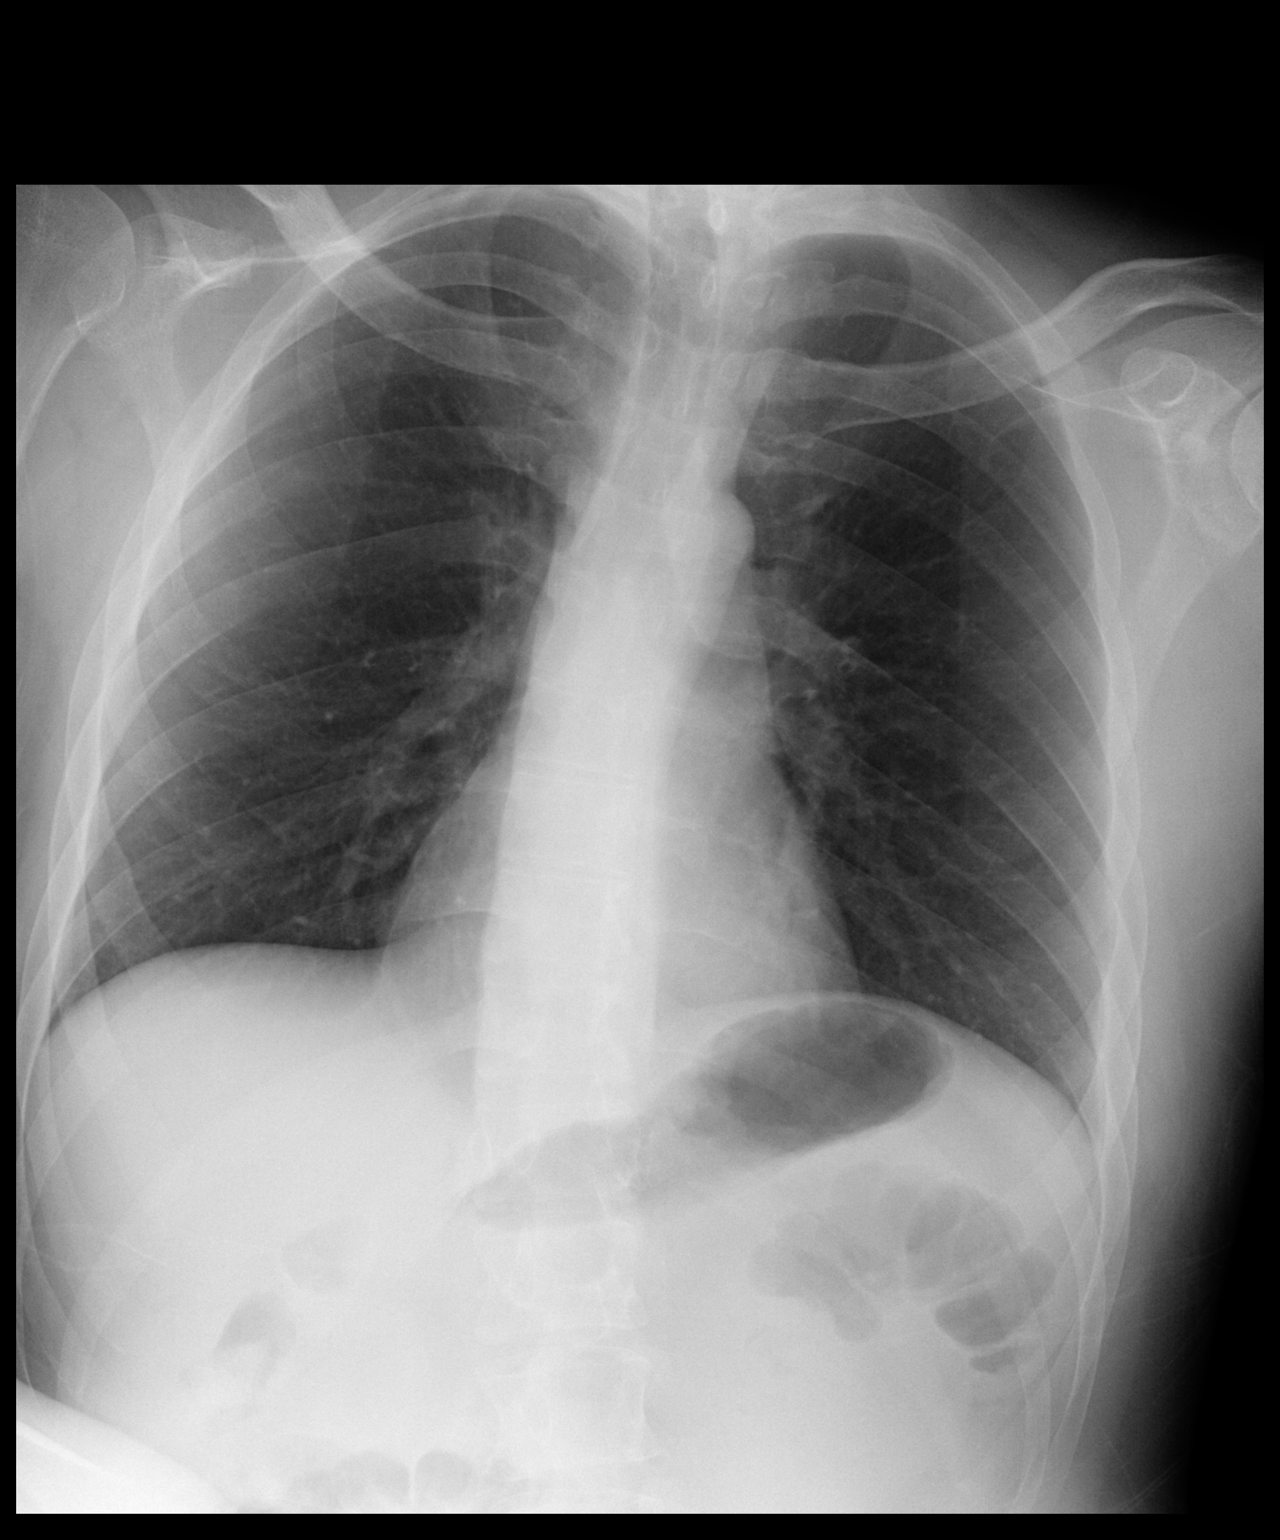

[dg chest 2 view]
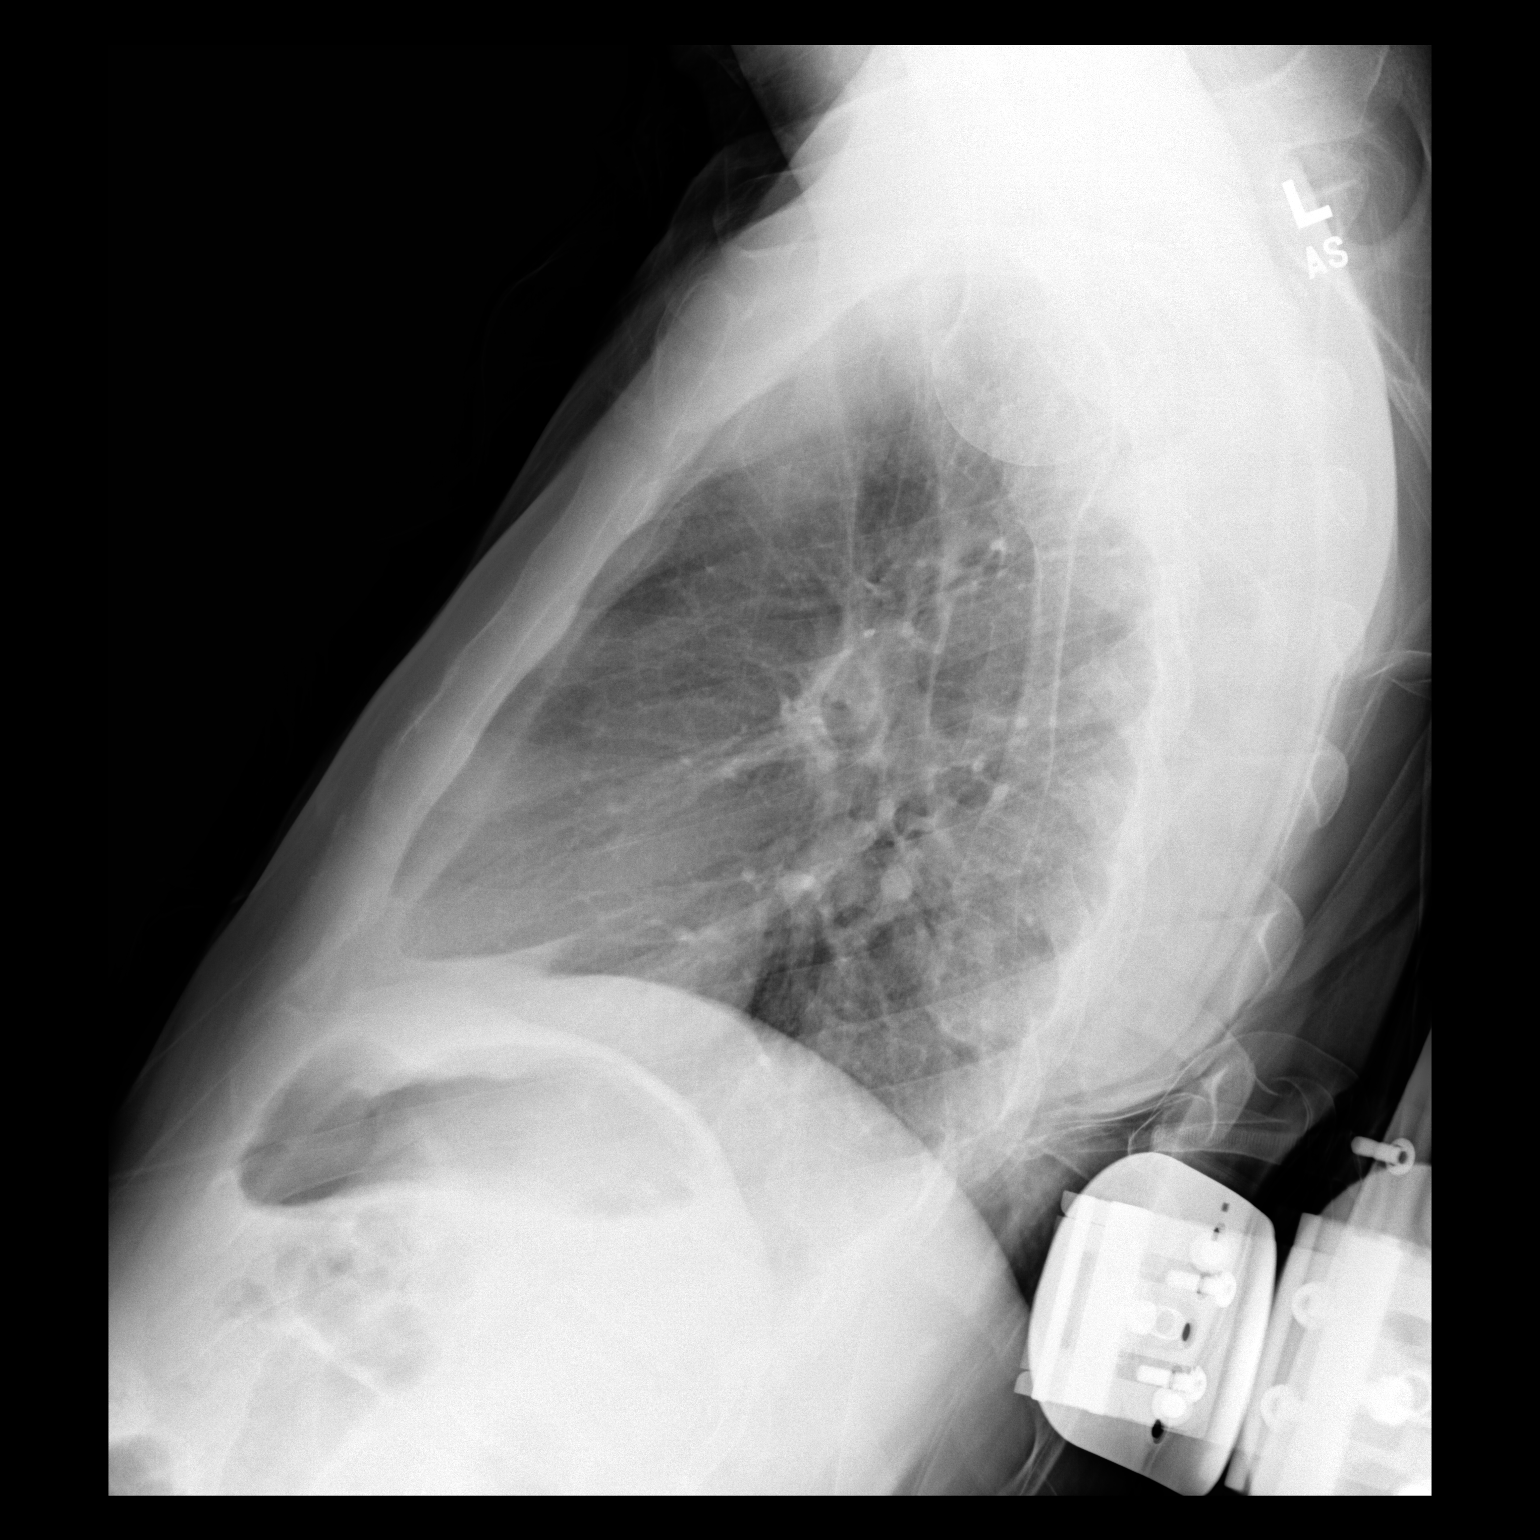

[2 of 2 positions shown; findings below may reference images not displayed]

FINDINGS: The heart size and mediastinal contours are within normal limits.
Both lungs are clear. The visualized skeletal structures again show
changes of mild scoliosis.
IMPRESSION: No acute abnormality noted.

## 2017-07-24 ENCOUNTER — Other Ambulatory Visit: Payer: Self-pay | Admitting: Family Medicine

## 2017-07-24 DIAGNOSIS — E559 Vitamin D deficiency, unspecified: Secondary | ICD-10-CM

## 2017-08-15 ENCOUNTER — Ambulatory Visit (INDEPENDENT_AMBULATORY_CARE_PROVIDER_SITE_OTHER): Payer: Medicare Other | Admitting: *Deleted

## 2017-08-15 DIAGNOSIS — Z23 Encounter for immunization: Secondary | ICD-10-CM | POA: Diagnosis present

## 2017-09-05 DIAGNOSIS — H401332 Pigmentary glaucoma, bilateral, moderate stage: Secondary | ICD-10-CM | POA: Diagnosis not present

## 2017-10-11 ENCOUNTER — Other Ambulatory Visit: Payer: Self-pay | Admitting: Family Medicine

## 2017-10-12 ENCOUNTER — Other Ambulatory Visit: Payer: Self-pay | Admitting: Family Medicine

## 2017-12-05 ENCOUNTER — Encounter: Payer: Self-pay | Admitting: Family Medicine

## 2017-12-05 ENCOUNTER — Ambulatory Visit (INDEPENDENT_AMBULATORY_CARE_PROVIDER_SITE_OTHER): Payer: Medicare Other | Admitting: Family Medicine

## 2017-12-05 ENCOUNTER — Other Ambulatory Visit: Payer: Self-pay

## 2017-12-05 VITALS — BP 114/78 | HR 77 | Temp 97.7°F | Ht 69.0 in | Wt 132.8 lb

## 2017-12-05 DIAGNOSIS — J302 Other seasonal allergic rhinitis: Secondary | ICD-10-CM | POA: Diagnosis not present

## 2017-12-05 DIAGNOSIS — J301 Allergic rhinitis due to pollen: Secondary | ICD-10-CM | POA: Diagnosis not present

## 2017-12-05 DIAGNOSIS — E785 Hyperlipidemia, unspecified: Secondary | ICD-10-CM | POA: Diagnosis not present

## 2017-12-05 DIAGNOSIS — R634 Abnormal weight loss: Secondary | ICD-10-CM

## 2017-12-05 MED ORDER — SALINE SPRAY 0.65 % NA SOLN: 1.0000 | NASAL | 12 refills | Status: DC | PRN

## 2017-12-05 NOTE — Assessment & Plan Note (Signed)
Signs of allergic rhinitis on exam today. -Continue Singulair, Astelin nasal spray -Has not been using nasal saline spray, discussed using this at least twice a day

## 2017-12-05 NOTE — Assessment & Plan Note (Signed)
Weight has seemed to stabilize into the low 130s over the last year.  He is eating all his meals and there is no concern for dysphasia or emesis.  Likely this is his new baseline - Continue current diet -Follow-up in 6 months for weight recheck

## 2017-12-05 NOTE — Patient Instructions (Addendum)
Thank you for coming in today, it was so nice to see you! Today we talked about:    Cholesterol: We are going to check your blood today. If your cholesterol is normal, we are going to stop your cholesterol medicine. If your cholesterol is still a little high, we will continue your medicine. We will recheck your cholesterol in 6 months and see where you are.   Weight: Your weight is stable. Continue eating 3 meals a day with snacks. We will also recheck this in 6 months  Nasal discharge: continue to use your nasal sprays as prescibed  Please follow up in 6 months.   Bring in all your medications or supplements to each appointment for review.   If we ordered any tests today, you will be notified via telephone of any abnormalities. If everything is normal you will get a letter in the mail.   If you have any questions or concerns, please do not hesitate to call the office at [phone]. You can also message me directly via MyChart.   Sincerely,  Anders Simmondshristina Kathelene Rumberger, MD

## 2017-12-05 NOTE — Assessment & Plan Note (Addendum)
History of hyperlipidemia.  Additionally has family history of hyperlipidemia mother and father side.  He was started on 20 mg of statin secondary to a LDL of 223 in 2015.  His LDL has normalized since. -Continue pravastatin 20 mg daily for now -Lipid panel today -Discussed with mother and patient that if cholesterol is normal we can try taking him off of the statin and rechecking lipids in 6 months -No ASCVD risk calculator as he is under 37 years old

## 2017-12-05 NOTE — Progress Notes (Signed)
Subjective:    Patient ID: Renard HamperEdward W Killough , male   DOB: [birth year] , 37 y.o..   MRN: [id number]  HPI  Renard Hamperdward W Kolar is a 37 year old male with history of cerebral palsy, open-angle glaucoma, hearing loss and hyperlipidemia here for  Chief Complaint  Patient presents with  . Cholesterol Check  . Weight Check   1. HYPERLIPIDEMIA Symptoms Chest pain on exertion: No    Leg claudication: No but is nonambulatory so difficult to assess.  Wheelchair-bound Medications (modifying factor): On pravastatin 20 mg daily Compliance-compliant with his medications  Right upper quadrant pain-no  Muscle aches-no Duration - years   Timing - continuous    Component Value Date/Time   CHOL 168 06/20/2016 0825   TRIG 115 06/20/2016 0825   HDL 39 (L) 06/20/2016 0825   LDLDIRECT 107 (H) 05/16/2017 0847   LDLDIRECT 156 (H) 09/30/2014 0850   VLDL 23 06/20/2016 0825   CHOLHDL 4.3 06/20/2016 0825   2. Weight: Weight in the 140s and 2017.  Since then his weight was down trending into the 130s.  He is eating 3 meals a day and completes all of his meals.  He also has a protein shake between lunch and dinner.  His caretaker states that he eats everything that is put in front of him.  Normal bowel movements.  Not having any dysphagia or trouble swallowing.  No emesis.  Review of Systems: Per HPI.  Past Medical History: Patient Active Problem List   Diagnosis Date Noted  . Annual physical exam 05/24/2017  . Wheelchair bound 05/24/2017  . Vitamin D insufficiency 05/29/2015  . Cerumen impaction 09/02/2013  . Perioral dermatitis 05/08/2013  . Loss of weight 11/07/2011  . Hyperlipidemia 07/18/2007  . THROMBOCYTOPENIA NOS 06/26/2007  . Cerebral palsy (HCC) 06/26/2007  . 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 Allergic rhinitis 06/26/2007  . SCOLIOSIS 06/26/2007    Medications: reviewed and updated Current Outpatient Medications    Medication Sig Dispense Refill  . azelastine (ASTELIN) 0.1 % nasal spray Place 1 spray into both nostrils 2 (two) times daily. Use in each nostril as directed 30 mL 12  . montelukast (SINGULAIR) 10 MG tablet Take 1 tablet (10 mg total) by mouth at bedtime. 30 tablet 11  . pravastatin (PRAVACHOL) 20 MG tablet TAKE 1 TABLET BY MOUTH DAILY. 30 tablet 11  . sodium chloride (OCEAN) 0.65 % SOLN nasal spray Place 1 spray into both nostrils as needed for congestion. As needed for allergy symptoms 1 Bottle 12  . brimonidine-timolol (COMBIGAN) 0.2-0.5 % ophthalmic solution Place 1 drop into both eyes every 12 (twelve) hours.    . carbamide peroxide (DEBROX) 6.5 % otic solution Place 5 drops into both ears once a week. 15 mL 5  . latanoprost (XALATAN) 0.005 % ophthalmic solution Place 1 drop into the left eye at bedtime.    . multivitamin (ONE-A-DAY MEN'S) TABS tablet Take 1 tablet by mouth daily. 30 tablet 11  . ondansetron (ZOFRAN-ODT) 4 MG disintegrating tablet Take ONE (1) tablet by mouth every 8 hours as needed for nausea 15 tablet 0  . Skin Protectants, Misc. (EUCERIN) cream Apply topically as needed (dry skin). Generic okay 397 g 0  . Vitamin D, Cholecalciferol, 400 units TABS Take (1) tablet by mouth once daily 30 tablet 11   No current facility-administered medications for this visit.     Social Hx:  reports that  has never smoked. he has  never used smokeless tobacco.   Objective:   BP 114/78   Pulse 77   Temp 97.7 F (36.5 C) (Oral)   Ht 5\' 9"  (1.753 m)   Wt 132 lb 12.8 oz (60.2 kg)   SpO2 97%   BMI 19.61 kg/m  Physical Exam  Gen: NAD, alert, cooperative with exam, well-appearing, in wheelchair, nonverbal but smiles and speaking sign language HEENT: NCAT, PERRL, clear conjunctiva, oropharynx clear, supple neck, crusting at nares Cardiac: Regular rate and rhythm, normal S1/S2, no murmur, no edema, capillary refill brisk  Respiratory: Clear to auscultation bilaterally, no wheezes,  non-labored breathing Gastrointestinal: soft, non tender, non distended, bowel sounds present   Assessment & Plan:  Hyperlipidemia History of hyperlipidemia.  Additionally has family history of hyperlipidemia mother and father side.  He was started on 20 mg of statin secondary to a LDL of 223 in 2015.  His LDL has normalized since. -Continue pravastatin 20 mg daily for now -Lipid panel today -Discussed with mother and patient that if cholesterol is normal we can try taking him off of the statin and rechecking lipids in 6 months -No ASCVD risk calculator as he is under 89 years old  Loss of weight Weight has seemed to stabilize into the low 130s over the last year.  He is eating all his meals and there is no concern for dysphasia or emesis.  Likely this is his new baseline - Continue current diet -Follow-up in 6 months for weight recheck  Allergic rhinitis Signs of allergic rhinitis on exam today. -Continue Singulair, Astelin nasal spray -Has not been using nasal saline spray, discussed using this at least twice a day   Orders Placed This Encounter  Procedures  . Lipid Panel   Meds ordered this encounter  Medications  . sodium chloride (OCEAN) 0.65 % SOLN nasal spray    Sig: Place 1 spray into both nostrils as needed for congestion. As needed for allergy symptoms    Dispense:  1 Bottle    Refill:  12    Anders Simmonds, MD [facility], PGY-3

## 2017-12-06 LAB — LIPID PANEL
CHOLESTEROL TOTAL: 186 mg/dL (ref 100–199)
Chol/HDL Ratio: 4.7 ratio (ref 0.0–5.0)
HDL: 40 mg/dL (ref >39)
LDL Calculated: 124 mg/dL — ABNORMAL HIGH (ref 0–99)
TRIGLYCERIDES: 108 mg/dL (ref 0–149)
VLDL CHOLESTEROL CAL: 22 mg/dL (ref 5–40)

## 2017-12-07 ENCOUNTER — Telehealth: Payer: Self-pay | Admitting: Family Medicine

## 2017-12-07 NOTE — Telephone Encounter (Signed)
Called mother discuss patient' lipid panel. His LDL is slightly high. We will continue his statin and recheck lipids in 6 months. She was appreciative of the call.   Anders Simmondshristina Gambino, MD [facility], PGY-3

## 2018-02-03 DIAGNOSIS — H5940 Inflammation (infection) of postprocedural bleb, unspecified: Secondary | ICD-10-CM | POA: Diagnosis not present

## 2018-02-03 DIAGNOSIS — H5789 Other specified disorders of eye and adnexa: Secondary | ICD-10-CM | POA: Diagnosis not present

## 2018-02-03 DIAGNOSIS — Z9889 Other specified postprocedural states: Secondary | ICD-10-CM | POA: Diagnosis not present

## 2018-02-03 DIAGNOSIS — H02401 Unspecified ptosis of right eyelid: Secondary | ICD-10-CM | POA: Diagnosis not present

## 2018-02-03 DIAGNOSIS — G809 Cerebral palsy, unspecified: Secondary | ICD-10-CM | POA: Diagnosis not present

## 2018-02-03 DIAGNOSIS — H1031 Unspecified acute conjunctivitis, right eye: Secondary | ICD-10-CM | POA: Diagnosis not present

## 2018-02-03 DIAGNOSIS — Z961 Presence of intraocular lens: Secondary | ICD-10-CM | POA: Diagnosis not present

## 2018-02-03 DIAGNOSIS — H401332 Pigmentary glaucoma, bilateral, moderate stage: Secondary | ICD-10-CM | POA: Diagnosis not present

## 2018-02-05 DIAGNOSIS — Z961 Presence of intraocular lens: Secondary | ICD-10-CM | POA: Diagnosis not present

## 2018-02-05 DIAGNOSIS — H5940 Inflammation (infection) of postprocedural bleb, unspecified: Secondary | ICD-10-CM | POA: Diagnosis not present

## 2018-02-05 DIAGNOSIS — H401332 Pigmentary glaucoma, bilateral, moderate stage: Secondary | ICD-10-CM | POA: Diagnosis not present

## 2018-02-06 DIAGNOSIS — H401332 Pigmentary glaucoma, bilateral, moderate stage: Secondary | ICD-10-CM | POA: Diagnosis not present

## 2018-02-06 DIAGNOSIS — H5940 Inflammation (infection) of postprocedural bleb, unspecified: Secondary | ICD-10-CM | POA: Diagnosis not present

## 2018-02-12 DIAGNOSIS — Z961 Presence of intraocular lens: Secondary | ICD-10-CM | POA: Diagnosis not present

## 2018-02-12 DIAGNOSIS — H401332 Pigmentary glaucoma, bilateral, moderate stage: Secondary | ICD-10-CM | POA: Diagnosis not present

## 2018-02-12 DIAGNOSIS — H5940 Inflammation (infection) of postprocedural bleb, unspecified: Secondary | ICD-10-CM | POA: Diagnosis not present

## 2018-02-19 DIAGNOSIS — H5989 Other postprocedural complications and disorders of eye and adnexa, not elsewhere classified: Secondary | ICD-10-CM | POA: Diagnosis not present

## 2018-02-19 DIAGNOSIS — H5940 Inflammation (infection) of postprocedural bleb, unspecified: Secondary | ICD-10-CM | POA: Diagnosis not present

## 2018-02-19 DIAGNOSIS — H401112 Primary open-angle glaucoma, right eye, moderate stage: Secondary | ICD-10-CM | POA: Diagnosis not present

## 2018-02-19 DIAGNOSIS — Z79899 Other long term (current) drug therapy: Secondary | ICD-10-CM | POA: Diagnosis not present

## 2018-02-19 DIAGNOSIS — T8183XA Persistent postprocedural fistula, initial encounter: Secondary | ICD-10-CM | POA: Diagnosis not present

## 2018-02-23 ENCOUNTER — Ambulatory Visit (INDEPENDENT_AMBULATORY_CARE_PROVIDER_SITE_OTHER): Payer: Medicare Other | Admitting: Family Medicine

## 2018-02-23 ENCOUNTER — Other Ambulatory Visit: Payer: Self-pay

## 2018-02-23 ENCOUNTER — Encounter: Payer: Self-pay | Admitting: Family Medicine

## 2018-02-23 VITALS — BP 128/72 | HR 93 | Temp 97.9°F | Ht 69.0 in | Wt 131.8 lb

## 2018-02-23 DIAGNOSIS — Z0289 Encounter for other administrative examinations: Secondary | ICD-10-CM

## 2018-02-23 NOTE — Progress Notes (Signed)
   Subjective:    Patient ID: Edward HamperEdward W Lumadue , male   DOB: February 18, 1981 , 37 y.o..   MRN: [id number]  HPI  Edward Alvarado is a 37 year old male with past medical history of cerebral palsy, open-angle glaucoma, hearing loss, and hyperlipidemia here for  Chief Complaint  Patient presents with  . special olympics form    1.  Patient is here today for [employer] form.  He is here today with his mother, sign language interpreter, and group home worker.  Patient does bowling in the [employer], he enjoys doing this a lot.  He denies any difficulty with the bowling.  Denies any chest pain, shortness of breath, dizziness, syncope.  He is wheelchair-bound secondary to cerebral palsy.  He establish care with a neurologist last year who notes that he was doing well and just recommended annual follow-up.  Review of Systems: Per HPI.   Past Medical History: Patient Active Problem List   Diagnosis Date Noted  . Annual physical exam 05/24/2017  . Wheelchair bound 05/24/2017  . Vitamin D insufficiency 05/29/2015  . Cerumen impaction 09/02/2013  . Perioral dermatitis 05/08/2013  . Loss of weight 11/07/2011  . Hyperlipidemia 07/18/2007  . THROMBOCYTOPENIA NOS 06/26/2007  . Cerebral palsy (HCC) 06/26/2007  . 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 Allergic rhinitis 06/26/2007  . SCOLIOSIS 06/26/2007    Medications: reviewed   Social Hx:  reports that he has never smoked. He has never used smokeless tobacco.   Objective:   BP 128/72   Pulse 93   Temp 97.9 F (36.6 C) (Oral)   Ht 5\' 9"  (1.753 m)   Wt 131 lb 12.8 oz (59.8 kg)   SpO2 97%   BMI 19.46 kg/m  Physical Exam  Gen: NAD, alert, cooperative with exam, well-appearing, in wheelchair, nonverbal but smiles and speaking sign language HEENT: NCAT, PERRL, conjunctiva red bilaterally (recently had eye surgery), oropharynx clear, supple neck Cardiac: Regular rate and  rhythm, normal S1/S2, no murmur, no edema, capillary refill brisk  Respiratory: Clear to auscultation bilaterally, no wheezes, non-labored breathing Gastrointestinal: soft, non tender, non distended, bowel sounds present MSK: Scoliosis is observed in the back.  Is able to move his upper extremities on command, does preserve some strength of his upper extremities but weak grip strength.  Is able to kick both of his legs in his wheelchair.  Sensation intact throughout.    Assessment & Plan:   1. Encounter for completion of form with patient: [employer] form filled out per patient.  He does bowling and enjoys this.  He will continue to follow with neurology yearly.  He is also following with his ophthalmologist.  Anders Simmondshristina Calvyn Kurtzman, MD [facility], PGY-3

## 2018-02-26 ENCOUNTER — Encounter: Payer: Self-pay | Admitting: Family Medicine

## 2018-03-06 DIAGNOSIS — H401332 Pigmentary glaucoma, bilateral, moderate stage: Secondary | ICD-10-CM | POA: Diagnosis not present

## 2018-03-15 ENCOUNTER — Telehealth: Payer: Self-pay

## 2018-03-15 NOTE — Telephone Encounter (Signed)
White team please let mother know that as long as he was vaccinated as a child then he should be protected against measles. Thank you.   Anders Simmonds, MD [facility], PGY-3

## 2018-03-15 NOTE — Telephone Encounter (Signed)
Pt's mother calling, would like to know if MD thinks pt needs an additional MMR vaccine due to recent measles outbreak. Call back [phone] Wallace Cullens, RN

## 2018-03-15 NOTE — Telephone Encounter (Signed)
LVM to call office back to inform pt mother of below. Please give her the below message if she calls back. Lamonte Sakai, April D, [place]

## 2018-03-19 ENCOUNTER — Other Ambulatory Visit: Payer: Self-pay | Admitting: Family Medicine

## 2018-03-19 MED ORDER — PRAVASTATIN SODIUM 20 MG PO TABS: 20.0000 mg | ORAL_TABLET | Freq: Every day | ORAL | 11 refills | Status: DC

## 2018-04-10 ENCOUNTER — Other Ambulatory Visit: Payer: Self-pay | Admitting: Family Medicine

## 2018-04-11 ENCOUNTER — Other Ambulatory Visit: Payer: Self-pay | Admitting: Family Medicine

## 2018-05-28 ENCOUNTER — Ambulatory Visit (INDEPENDENT_AMBULATORY_CARE_PROVIDER_SITE_OTHER): Payer: Medicare Other | Admitting: Family Medicine

## 2018-05-28 ENCOUNTER — Encounter: Payer: Self-pay | Admitting: Family Medicine

## 2018-05-28 ENCOUNTER — Other Ambulatory Visit: Payer: Self-pay

## 2018-05-28 VITALS — BP 118/60 | HR 71 | Temp 98.1°F | Ht 69.0 in | Wt 121.4 lb

## 2018-05-28 DIAGNOSIS — Z0000 Encounter for general adult medical examination without abnormal findings: Secondary | ICD-10-CM

## 2018-05-28 DIAGNOSIS — E785 Hyperlipidemia, unspecified: Secondary | ICD-10-CM

## 2018-05-28 DIAGNOSIS — R634 Abnormal weight loss: Secondary | ICD-10-CM

## 2018-05-28 DIAGNOSIS — E559 Vitamin D deficiency, unspecified: Secondary | ICD-10-CM | POA: Diagnosis not present

## 2018-05-28 MED ORDER — BOOST BREEZE PO LIQD: 237.0000 mL | Freq: Two times a day (BID) | ORAL | 6 refills | Status: DC

## 2018-05-28 NOTE — Patient Instructions (Addendum)
   Let's try adding a protein shake/meal shake or drinkable yogurt twice a day.  You can also try adding protein to breakfast whether it's an egg or sausage/bacon etc.   You can download MyFitnessPal and set it to gain weight so we can figure out how many calories he needs a day to gain weight. We can track how many calories he gets a day to hit that target calorie goal for weight gain.  If you have questions or concerns please do not hesitate to call at [phone].  Dolores PattyAngela Noma Quijas, DO PGY-2, [facility] 05/28/2018 4:04 PM

## 2018-05-28 NOTE — Progress Notes (Signed)
Subjective:    Patient ID: Edward Alvarado, male    DOB: [birth year], 37 y.o.   MRN: [id number]   CC: physical exam  HPI: Patient is here today accompanied by his mother, group home administrator, group home caregiver, and sign language interpreter.   Doing well overall however continues to struggle with weight loss. Weight today 121 pounds. Weight was 131 pounds in April. He has struggled with this for many years and has seen a nutritionist in the past without improvement.  24-hr recall: B ( AM)- honey nut cheerios in milk  Snk ( AM)-  none L ( PM)-  Ham and cheese sandwich on white bread with mayo, a side of either popcorn, trail mix or yogurt, protein shake as a drink Snk ( PM)-  none D ( PM)-  Some type of baked meat- chicken, fish and veggies. Sometimes a side of pasta or other starch Snk ( PM)-  none Typical day? Yes.    On weekends he goes out for pizza for dinner and lunch on Saturday which varies. They will have bacon, eggs, waffles at the group home for breakfast on weekends.  No HM due.   Mom reports she is concerned he is on a statin due to cerebral palsy and risk for myalgias. He denies any current muscle pains but mom states he has a high pain tolerance. She is hopeful LDL will improve enough to get off this medication.  Smoking status reviewed- non-smoker  Review of Systems- denies nausea, vomiting, decreased appetite, abdominal pain, diarrhea, constipation, fevers, chills, night sweats, cough   Objective:  BP 118/60   Pulse 71   Temp 98.1 F (36.7 C) (Oral)   Ht 5\' 9"  (1.753 m)   Wt 121 lb 6.4 oz (55.1 kg)   SpO2 99%   BMI 17.93 kg/m  Vitals and nursing note reviewed  General: thin, well appearing, in NAD HEENT: normocephalic, TM's visualized bilaterally with cerumen bilaterally but not occluding ear canal, no scleral icterus or conjunctival pallor, no nasal discharge, moist mucous membranes, poor dentition without erythema or discharge noted in  posterior oropharynx Neck: supple, non-tender, without lymphadenopathy Cardiac: RRR, clear S1 and S2, no murmurs, rubs, or gallops Respiratory: clear to auscultation bilaterally, no increased work of breathing Abdomen: soft, nontender, nondistended, no masses or organomegaly. Bowel sounds present Extremities: thin extremities in contracted positions, no edema or cyanosis Neuro: wheelchair bound, alert, interactive   Assessment & Plan:   1. Annual physical exam No HM due. Vitals stable. See plan for weight loss below. - Comprehensive metabolic panel  2. Loss of weight Loss of weight concerning to family/caregivers. Does not seem he is taking in adequate calories to maintain or gain weight. TSH normal on last check. Advised adding in twice daily meal supplements with ensure or boost, or even yogurt drinks. Advised adding protein to breakfasts. Encouraged snacks. Also discussed using MyFitnessPal app to calculate calorie needs to gain weight and track his caloric intake. Family/caregivers in agreement with plan. Follow up if any additional needs.  - Comprehensive metabolic panel  3. Hyperlipidemia, unspecified hyperlipidemia type Last LDL 124. He is on pravastatin 20 mg daily. Mother would like him off this medication due to CP and muscle issues. There is no evidence he is having myalgias. Will repeat lipid panel today and check CMP.  - Lipid panel  4. Vitamin D deficiency He is on 400 units vitamin D daily. Repeat Vitamin D today.  - VITAMIN D 25 Hydroxy (Vit-D  Deficiency, Fractures)   Return in about 1 year (around 05/29/2019), or as needed.   Dolores PattyAngela Eoin Willden, DO Family Medicine Resident PGY-2

## 2018-05-29 LAB — COMPREHENSIVE METABOLIC PANEL
A/G RATIO: 1.6 (ref 1.2–2.2)
ALK PHOS: 73 IU/L (ref 39–117)
ALT: 20 IU/L (ref 0–44)
AST: 16 IU/L (ref 0–40)
Albumin: 4.2 g/dL (ref 3.5–5.5)
BUN/Creatinine Ratio: 22 — ABNORMAL HIGH (ref 9–20)
BUN: 15 mg/dL (ref 6–20)
Bilirubin Total: 0.9 mg/dL (ref 0.0–1.2)
CO2: 26 mmol/L (ref 20–29)
Calcium: 9.2 mg/dL (ref 8.7–10.2)
Chloride: 103 mmol/L (ref 96–106)
Creatinine, Ser: 0.67 mg/dL — ABNORMAL LOW (ref 0.76–1.27)
GFR calc Af Amer: 141 mL/min/{1.73_m2} (ref >59)
GFR, EST NON AFRICAN AMERICAN: 122 mL/min/{1.73_m2} (ref >59)
GLOBULIN, TOTAL: 2.6 g/dL (ref 1.5–4.5)
Glucose: 86 mg/dL (ref 65–99)
POTASSIUM: 4.5 mmol/L (ref 3.5–5.2)
SODIUM: 141 mmol/L (ref 134–144)
Total Protein: 6.8 g/dL (ref 6.0–8.5)

## 2018-05-29 LAB — VITAMIN D 25 HYDROXY (VIT D DEFICIENCY, FRACTURES): Vit D, 25-Hydroxy: 28.5 ng/mL — ABNORMAL LOW (ref 30.0–100.0)

## 2018-05-29 LAB — LIPID PANEL
CHOL/HDL RATIO: 5.4 ratio — AB (ref 0.0–5.0)
Cholesterol, Total: 198 mg/dL (ref 100–199)
HDL: 37 mg/dL — AB (ref >39)
LDL CALC: 129 mg/dL — AB (ref 0–99)
TRIGLYCERIDES: 158 mg/dL — AB (ref 0–149)
VLDL Cholesterol Cal: 32 mg/dL (ref 5–40)

## 2018-05-30 ENCOUNTER — Telehealth: Payer: Self-pay | Admitting: Family Medicine

## 2018-05-30 ENCOUNTER — Encounter: Payer: Self-pay | Admitting: Family Medicine

## 2018-05-30 DIAGNOSIS — E7800 Pure hypercholesterolemia, unspecified: Secondary | ICD-10-CM

## 2018-05-30 DIAGNOSIS — E559 Vitamin D deficiency, unspecified: Secondary | ICD-10-CM

## 2018-05-30 MED ORDER — VITAMIN D 1000 UNITS PO TABS: 1000.0000 [IU] | ORAL_TABLET | Freq: Every day | ORAL | 3 refills | Status: DC

## 2018-05-30 MED ORDER — ATORVASTATIN CALCIUM 40 MG PO TABS: 40.0000 mg | ORAL_TABLET | Freq: Every day | ORAL | 3 refills | Status: DC

## 2018-05-30 NOTE — Telephone Encounter (Signed)
Thank you, I sent the medications in for them.

## 2018-05-30 NOTE — Telephone Encounter (Signed)
Mom called back on nurse line stating after some reflection, she does not want to go up on pts statin. She will keep him on current regime. Pts mom stated he was not fasting during his lab work and feels this has altered his result.

## 2018-05-30 NOTE — Telephone Encounter (Signed)
  Left voicemail for Edward Alvarado's mother about his labs. His vitamin D was low. He is only on 400 units daily. Would increase this to at least 1000 units daily. Also his LDL remains elevated despite pravastatin. Pravastatin is moderate intensity statin. Would increase this to a high intensity statin, however I know his mother is concerned about statin with his cerebral palsy muscle issues. I asked her to call me back to discuss. I sent a letter with lab results as well.   Edward PattyAngela Sabrin Dunlevy, DO PGY-2, [facility] 05/30/2018 9:06 AM

## 2018-05-30 NOTE — Telephone Encounter (Signed)
Mother called nurse line in regards to pcp message below. I spoke with mother about pcp recommendations, mother ok to switch to 1000 units vitamin D and to the higher intensity statin. Please send these medications to [facility] in [place], [place].

## 2018-05-30 NOTE — Addendum Note (Signed)
Addended by: Dolores PattyICCIO, Virl Coble C on: 05/30/2018 11:58 AM   Modules accepted: Orders

## 2018-06-01 NOTE — Addendum Note (Signed)
Addended by: Tillman SersICCIO, Tekeyah Santiago C on: 06/01/2018 08:33 AM   Modules accepted: Orders

## 2018-06-01 NOTE — Telephone Encounter (Signed)
Called Edward Alvarado again, discussed that fasting typically does not affect LDL levels but would affect triglycerides. It does not appear LDL is improving on pravastatin. She is agreeable to trial higher intensity statin and we will repeat lipid panel in 3 months.   Dolores PattyAngela Riccio, DO PGY-2, [facility] 06/01/2018 8:33 AM

## 2018-06-05 DIAGNOSIS — H401332 Pigmentary glaucoma, bilateral, moderate stage: Secondary | ICD-10-CM | POA: Diagnosis not present

## 2018-06-05 DIAGNOSIS — Z961 Presence of intraocular lens: Secondary | ICD-10-CM | POA: Diagnosis not present

## 2018-06-06 ENCOUNTER — Telehealth: Payer: Self-pay

## 2018-06-06 NOTE — Telephone Encounter (Signed)
Pts mother called nurse line with some concerns regarding his most recent nutrional supplement boost breeze. Pts mother stated she would prefer him to have something with less carbs and sugar and with more protein. Pts mother states the boost breeze also comes with a straw, which he has difficulty using. Please advise.

## 2018-06-07 MED ORDER — ENSURE HIGH PROTEIN PO LIQD: 1.0000 | Freq: Two times a day (BID) | ORAL | 11 refills | Status: DC

## 2018-06-07 NOTE — Telephone Encounter (Signed)
Sent in new rx for ensure high protein. Will see if that works. Thanks!  Dolores PattyAngela Elexia Friedt, DO PGY-2,  Family Medicine 06/07/2018 12:04 PM

## 2018-06-07 NOTE — Telephone Encounter (Signed)
Pts mother contacted and informed of high protein ensure sent into pharmacy. She was very appreciative and will let us know if any further issues.

## 2018-08-06 ENCOUNTER — Ambulatory Visit (INDEPENDENT_AMBULATORY_CARE_PROVIDER_SITE_OTHER): Payer: Medicare Other | Admitting: *Deleted

## 2018-08-06 DIAGNOSIS — Z23 Encounter for immunization: Secondary | ICD-10-CM

## 2018-09-11 ENCOUNTER — Other Ambulatory Visit: Payer: Self-pay | Admitting: Family Medicine

## 2018-09-18 DIAGNOSIS — H401332 Pigmentary glaucoma, bilateral, moderate stage: Secondary | ICD-10-CM | POA: Diagnosis not present

## 2018-09-18 DIAGNOSIS — Z961 Presence of intraocular lens: Secondary | ICD-10-CM | POA: Diagnosis not present

## 2018-10-02 ENCOUNTER — Other Ambulatory Visit: Payer: Medicare Other

## 2018-10-02 DIAGNOSIS — E7800 Pure hypercholesterolemia, unspecified: Secondary | ICD-10-CM | POA: Diagnosis not present

## 2018-10-03 LAB — LIPID PANEL
CHOL/HDL RATIO: 3.8 ratio (ref 0.0–5.0)
Cholesterol, Total: 156 mg/dL (ref 100–199)
HDL: 41 mg/dL (ref >39)
LDL CALC: 85 mg/dL (ref 0–99)
Triglycerides: 149 mg/dL (ref 0–149)
VLDL CHOLESTEROL CAL: 30 mg/dL (ref 5–40)

## 2018-10-09 ENCOUNTER — Other Ambulatory Visit: Payer: Self-pay

## 2018-10-09 ENCOUNTER — Ambulatory Visit (INDEPENDENT_AMBULATORY_CARE_PROVIDER_SITE_OTHER): Payer: Medicare Other | Admitting: Family Medicine

## 2018-10-09 ENCOUNTER — Encounter: Payer: Self-pay | Admitting: Family Medicine

## 2018-10-09 VITALS — BP 120/78 | HR 81 | Temp 98.6°F | Ht 69.0 in | Wt 142.5 lb

## 2018-10-09 DIAGNOSIS — R634 Abnormal weight loss: Secondary | ICD-10-CM | POA: Diagnosis not present

## 2018-10-09 DIAGNOSIS — E785 Hyperlipidemia, unspecified: Secondary | ICD-10-CM | POA: Diagnosis not present

## 2018-10-09 MED ORDER — ATORVASTATIN CALCIUM 20 MG PO TABS: 20.0000 mg | ORAL_TABLET | Freq: Every day | ORAL | 3 refills | Status: DC

## 2018-10-09 NOTE — Patient Instructions (Signed)
  Great to see you today! I'm happy with your weight and the cholesterol level. Let's decrease the atorvastatin to 20 mg daily and repeat cholesterol levels in 6 months to make sure that dosage is working. Keep up the good work with weight, we'll see how weight looks in 6 months as well.  If you have questions or concerns please do not hesitate to call at [phone].  Dolores PattyAngela Jacci Ruberg, DO PGY-3, [facility] 10/09/2018 10:28 AM

## 2018-10-09 NOTE — Progress Notes (Signed)
    Subjective:    Patient ID: Renard HamperEdward W Hickok, male    DOB: May 14, 1981, 37 y.o.   MRN: [id number]   CC: follow up weight and labs  HPI: patient is here today accompanied by his mother, group home director, group home caregiver, and sign language interpreter.  They are pleased with his weight gain.  [name] is tolerating the statin well however mother would prefer to lower dose if possible.  He has been doing well with no recent health changes. No other concerns from group home caregiver or mother.   Smoking status reviewed- non-smoker  Review of Systems- no constipation or diarrhea, recent illnesses, fever, chills, muscle pains   Objective:  BP 120/78   Pulse 81   Temp 98.6 F (37 C) (Oral)   Ht 5\' 9"  (1.753 m)   Wt 142 lb 8 oz (64.6 kg)   SpO2 98%   BMI 21.04 kg/m  Vitals and nursing note reviewed  General: thin extremities, wheelchair bound, in NAD HEENT: normocephalic, no scleral icterus or conjunctival pallor, no nasal discharge, moist mucous membranes, good dentition without erythema or discharge noted in posterior oropharynx Neck: supple, non-tender, without lymphadenopathy Cardiac: RRR, clear S1 and S2, no murmurs, rubs, or gallops Respiratory: clear to auscultation bilaterally, no increased work of breathing Abdomen: soft, nontender Neuro: spastic toning of extremities, non-verbal, follows commands   Assessment & Plan:    Loss of weight  Patient had lost weight 2/2 deficient calories. Patient has gained 20 pounds with implementation of dietary supplements and increasing calories in diet. Family and I are pleased with progress. Will follow up 6 months to recheck weight to ensure he is maintaining.   Hyperlipidemia  LDL had improved on 40 mg atorvastatin from 129 > 85. Will decrease atorvastatin to 20 mg, new rx sent in to pharmacy. Repeat lipid panel and follow up 6 months     Return in about 6 months (around 04/09/2019).   Dolores PattyAngela Rashema Seawright, DO Family  Medicine Resident PGY-3

## 2018-10-10 NOTE — Assessment & Plan Note (Signed)
  LDL had improved on 40 mg atorvastatin from 129 > 85. Will decrease atorvastatin to 20 mg, new rx sent in to pharmacy. Repeat lipid panel and follow up 6 months

## 2018-10-10 NOTE — Assessment & Plan Note (Addendum)
  Patient had lost weight 2/2 deficient calories. Patient has gained 20 pounds with implementation of dietary supplements and increasing calories in diet. Family and I are pleased with progress. Will follow up 6 months to recheck weight to ensure he is maintaining.

## 2018-11-23 ENCOUNTER — Other Ambulatory Visit: Payer: Self-pay

## 2018-11-23 ENCOUNTER — Emergency Department (HOSPITAL_BASED_OUTPATIENT_CLINIC_OR_DEPARTMENT_OTHER): Payer: Medicare Other

## 2018-11-23 ENCOUNTER — Encounter (HOSPITAL_BASED_OUTPATIENT_CLINIC_OR_DEPARTMENT_OTHER): Payer: Self-pay

## 2018-11-23 ENCOUNTER — Emergency Department (HOSPITAL_BASED_OUTPATIENT_CLINIC_OR_DEPARTMENT_OTHER)
Admission: EM | Admit: 2018-11-23 | Discharge: 2018-11-23 | Disposition: A | Discharge status: Home / Self Care | Payer: Medicare Other | Attending: Emergency Medicine | Admitting: Emergency Medicine

## 2018-11-23 DIAGNOSIS — J111 Influenza due to unidentified influenza virus with other respiratory manifestations: Secondary | ICD-10-CM | POA: Diagnosis not present

## 2018-11-23 DIAGNOSIS — Z79899 Other long term (current) drug therapy: Secondary | ICD-10-CM | POA: Diagnosis not present

## 2018-11-23 DIAGNOSIS — R05 Cough: Secondary | ICD-10-CM | POA: Diagnosis not present

## 2018-11-23 DIAGNOSIS — R509 Fever, unspecified: Secondary | ICD-10-CM | POA: Diagnosis present

## 2018-11-23 DIAGNOSIS — R69 Illness, unspecified: Secondary | ICD-10-CM

## 2018-11-23 DIAGNOSIS — G809 Cerebral palsy, unspecified: Secondary | ICD-10-CM | POA: Diagnosis not present

## 2018-11-23 DIAGNOSIS — R0989 Other specified symptoms and signs involving the circulatory and respiratory systems: Secondary | ICD-10-CM | POA: Insufficient documentation

## 2018-11-23 HISTORY — DX: Allergic rhinitis, unspecified: J30.9

## 2018-11-23 LAB — COMPREHENSIVE METABOLIC PANEL
ALT: 27 U/L (ref 0–44)
AST: 20 U/L (ref 15–41)
Albumin: 3.8 g/dL (ref 3.5–5.0)
Alkaline Phosphatase: 65 U/L (ref 38–126)
Anion gap: 8 (ref 5–15)
BUN: 11 mg/dL (ref 6–20)
CO2: 22 mmol/L (ref 22–32)
Calcium: 8.9 mg/dL (ref 8.9–10.3)
Chloride: 106 mmol/L (ref 98–111)
Creatinine, Ser: 0.76 mg/dL (ref 0.61–1.24)
GFR calc Af Amer: >60 mL/min (ref >60)
Glucose, Bld: 98 mg/dL (ref 70–99)
Potassium: 3.9 mmol/L (ref 3.5–5.1)
Sodium: 136 mmol/L (ref 135–145)
Total Bilirubin: 0.8 mg/dL (ref 0.3–1.2)
Total Protein: 6.7 g/dL (ref 6.5–8.1)

## 2018-11-23 LAB — PROTIME-INR
INR: 1.09
Prothrombin Time: 14 seconds (ref 11.4–15.2)

## 2018-11-23 LAB — CBC WITH DIFFERENTIAL/PLATELET
ABS IMMATURE GRANULOCYTES: 0.02 10*3/uL (ref 0.00–0.07)
Basophils Absolute: 0 10*3/uL (ref 0.0–0.1)
Basophils Relative: 0 %
Eosinophils Absolute: 0.2 10*3/uL (ref 0.0–0.5)
Eosinophils Relative: 2 %
HCT: 44.4 % (ref 39.0–52.0)
Hemoglobin: 14.8 g/dL (ref 13.0–17.0)
Immature Granulocytes: 0 %
Lymphocytes Relative: 9 %
Lymphs Abs: 0.7 10*3/uL (ref 0.7–4.0)
MCH: 30.7 pg (ref 26.0–34.0)
MCHC: 33.3 g/dL (ref 30.0–36.0)
MCV: 92.1 fL (ref 80.0–100.0)
Monocytes Absolute: 1.2 10*3/uL — ABNORMAL HIGH (ref 0.1–1.0)
Monocytes Relative: 16 %
Neutro Abs: 5.3 10*3/uL (ref 1.7–7.7)
Neutrophils Relative %: 73 %
PLATELETS: 86 10*3/uL — AB (ref 150–400)
RBC: 4.82 MIL/uL (ref 4.22–5.81)
RDW: 11.9 % (ref 11.5–15.5)
WBC: 7.4 10*3/uL (ref 4.0–10.5)
nRBC: 0 % (ref 0.0–0.2)

## 2018-11-23 LAB — URINALYSIS, ROUTINE W REFLEX MICROSCOPIC
Bilirubin Urine: NEGATIVE
Glucose, UA: NEGATIVE mg/dL
Hgb urine dipstick: NEGATIVE
Ketones, ur: NEGATIVE mg/dL
Leukocytes, UA: NEGATIVE
NITRITE: NEGATIVE
Protein, ur: NEGATIVE mg/dL
Specific Gravity, Urine: 1.02 (ref 1.005–1.030)
pH: 7.5 (ref 5.0–8.0)

## 2018-11-23 LAB — I-STAT CG4 LACTIC ACID, ED: Lactic Acid, Venous: 1.19 mmol/L (ref 0.5–1.9)

## 2018-11-23 MED ORDER — OSELTAMIVIR PHOSPHATE 75 MG PO CAPS: 75.0000 mg | ORAL_CAPSULE | Freq: Two times a day (BID) | ORAL | 0 refills | Status: DC

## 2018-11-23 MED FILL — TAMIFLU 75 MG GELCAP: 75 | 5 days supply | Qty: 10 | Fill #0

## 2018-11-23 NOTE — ED Triage Notes (Signed)
BIB Mom from [facility] w/ c/o fever, cough, and runny nose. Pt denies pain at this time.   Pt was given 1g of Tylenol at Group Home.

## 2018-11-23 NOTE — Discharge Instructions (Addendum)
Continue Tylenol for the fever.  Start the Tamiflu this afternoon.  Take as directed.  Return for any new or worse symptoms.  Today's work-up including chest x-ray urinalysis and labs without any significant abnormalities.  Suspect symptoms are flulike in nature.

## 2018-11-23 NOTE — ED Provider Notes (Signed)
[facility] EMERGENCY DEPARTMENT Provider Note   CSN: [id number] Arrival date & time: 11/23/18  0388     History   Chief Complaint Chief Complaint  Patient presents with  . Fever  . Cough    HPI Edward Alvarado is a 38 y.o. male.  Patient has a history of cerebral palsy.  He is taken care of by the [facility] group home.  Here with his caregiver and his mother.  This morning they noted fever with some cough and slight runny nose.  No nausea vomiting or diarrhea.  Patient seemed fine yesterday.  May be a little off his normal self late last evening.  Was given Tylenol at the group home at about 845 this morning.  Patient did have the flu shot in October.  Patient is nonverbal.     Past Medical History:  Diagnosis Date  . Allergic rhinitis   . Allergy   . Atrophy, cortical (HCC)    Lower left ventrical  . Cerebral palsy (HCC)    W/C BOUND-ABLE TO TRANSFER W/C TO BED -DOES REQUIRE SOME HELP IN BATHROOM WITH CLOTHES--LIVES IN GROUP HOME CALLED [name]-HIS PARENTS ARE HIS LEGAL GUARDIANS  . Complication of anesthesia    JAN 2012 EYE SURGERY AT [facility] WAS USED AND PT EXPERIENCED AIRWAY / OXYGENATION PROBLEMS.  PT HAD SUBSEQUENT SURGERIES  AFTER JAN 2012 AT [facility] AND PARENTS TOLD ET USED AND PT DID FINE.  PT HAD NO ANESTHESIA PROBLEMS WITH SURGERIES PRIOR TO THE JAN 2012 SURGERY.  . Deafness    hearing aids-NOT WEARING AT PRESENT TIME; ABLE TO DO SIGN LANGUAGE WITH HIS PARENTS  . Glaucoma   . Hyperlipidemia   . Kidney stone   . Platelets decreased (HCC)   . Prepatellar bursitis    Hospitalized in 2012 for sepsis secondary to bursisiitis  . Scoliosis     Patient Active Problem List   Diagnosis Date Noted  . Wheelchair bound 05/24/2017  . Vitamin D insufficiency 05/29/2015  . Cerumen impaction 09/02/2013  . Loss of weight 11/07/2011  . Hyperlipidemia 07/18/2007  . THROMBOCYTOPENIA NOS 06/26/2007  . Cerebral palsy (HCC) 06/26/2007  . 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 Allergic rhinitis 06/26/2007  . SCOLIOSIS 06/26/2007    Past Surgical History:  Procedure Laterality Date  . ADENOIDECTOMY    . CYSTOSCOPY/RETROGRADE/URETEROSCOPY  10/01/2012   Procedure: CYSTOSCOPY/RETROGRADE/URETEROSCOPY;  Surgeon: Crecencio Mc, MD;  Location: [facility];  Service: Urology;  Laterality: Left;  laser lithotripsy stone extraction on left  . eye surgery  2012   [facility]  . EYE SURGERY     4 RIGHT EYE SURGERIES FOR GLAUCOMA AND ONE  RT EYE CATARACT EXTRACTION; LEFT EYE CATARACT EXTRACTION WITH  I STENTPROCEDURE FOR GLAUCOMA  . HOLMIUM LASER APPLICATION  10/01/2012   Procedure: HOLMIUM LASER APPLICATION;  Surgeon: Crecencio Mc, MD;  Location: [facility];  Service: Urology;  Laterality: Left;  . multiple orthopedic procedures    . MYRINGOTOMY    . SELECTIVE RHIZOTOMY PROCEDURE TO HELP REDUCE SPASTICITY AND CONTRACTURES          Home Medications    Prior to Admission medications   Medication Sig Start Date End Date Taking? Authorizing Provider  atorvastatin (LIPITOR) 20 MG tablet Take 1 tablet (20 mg total) by mouth daily at 6 PM. 10/09/18   Riccio, Angela C, DO  azelastine (ASTELIN) 0.1 % nasal spray Place 1 spray into both nostrils 2 (two)  times daily. Use in each nostril as directed 02/09/17   Delynn FlavinGottschalk, Ashly M, DO  brimonidine-timolol (COMBIGAN) 0.2-0.5 % ophthalmic solution Place 1 drop into both eyes every 12 (twelve) hours. 05/08/13   Andrena Mewsigby, Michael D, DO  carbamide peroxide (DEBROX) 6.5 % otic solution Place 5 drops into both ears once a week. 05/25/16   Raliegh IpGottschalk, Ashly M, DO  cholecalciferol (VITAMIN D) 1000 units tablet TAKE (1) TABLET BY MOUTH ONCE DAILY. 09/12/18   Garth Bignessimberlake, Kathryn, MD  montelukast (SINGULAIR) 10 MG tablet TAKE 1 TABLET BY MOUTH AT BEDTIME. 03/19/18   Beaulah DinningGambino, Christina M, MD  multivitamin (ONE-A-DAY MEN'S) TABS tablet Take 1 tablet by mouth daily. 04/10/18   Beaulah DinningGambino,  Christina M, MD  Nutritional Supplements (ENSURE HIGH PROTEIN) LIQD Take 1 Bottle by mouth 2 (two) times daily. 06/07/18   Tillman Sersiccio, Angela C, DO  oseltamivir (TAMIFLU) 75 MG capsule Take 1 capsule (75 mg total) by mouth every 12 (twelve) hours. 11/23/18   Vanetta MuldersZackowski, Franchelle Foskett, MD  Skin Protectants, Misc. (EUCERIN) cream Apply topically as needed (dry skin). Generic okay 05/25/16   Delynn FlavinGottschalk, Ashly M, DO  sodium chloride (OCEAN) 0.65 % SOLN nasal spray Place 1 spray into both nostrils as needed for congestion. As needed for allergy symptoms 12/05/17   Beaulah DinningGambino, Christina M, MD    Family History Family History  Problem Relation Age of Onset  . Asthma Mother   . Kidney Stones Mother   . Osteoporosis Mother   . Hypertension Mother   . Hyperlipidemia Mother   . Hyperlipidemia Father   . Hodgkin's lymphoma Paternal Uncle   . Alzheimer's disease Paternal Grandmother   . Stroke Paternal Grandmother   . Asthma Maternal Grandmother   . Stroke Paternal Grandfather     Social History Social History   Tobacco Use  . Smoking status: Never Smoker  . Smokeless tobacco: Never Used  Substance Use Topics  . Alcohol use: No  . Drug use: No     Allergies   Afrin [oxymetazoline]; Betadine [povidone iodine]; and Iodine   Review of Systems Review of Systems  Unable to perform ROS: Patient nonverbal     Physical Exam Updated Vital Signs BP 112/79 (BP Location: Right Arm)   Pulse 93   Temp 98.4 F (36.9 C) (Oral)   Resp 16   Ht 1.753 m (5\' 9" )   Wt 65.8 kg   SpO2 97%   BMI 21.41 kg/m   Physical Exam Vitals signs and nursing note reviewed.  Constitutional:      General: He is not in acute distress.    Appearance: He is well-developed. He is not ill-appearing or toxic-appearing.  HENT:     Head: Normocephalic and atraumatic.     Nose: Nose normal.     Mouth/Throat:     Mouth: Mucous membranes are moist.     Pharynx: No oropharyngeal exudate or posterior oropharyngeal erythema.  Eyes:       Extraocular Movements: Extraocular movements intact.     Conjunctiva/sclera: Conjunctivae normal.     Pupils: Pupils are equal, round, and reactive to light.  Neck:     Musculoskeletal: Neck supple.  Cardiovascular:     Rate and Rhythm: Normal rate and regular rhythm.     Heart sounds: No murmur.  Pulmonary:     Effort: Pulmonary effort is normal. No respiratory distress.     Breath sounds: Normal breath sounds.  Abdominal:     Palpations: Abdomen is soft.     Tenderness: There is  no abdominal tenderness.  Musculoskeletal: Normal range of motion.     Right lower leg: No edema.     Left lower leg: No edema.  Lymphadenopathy:     Cervical: No cervical adenopathy.  Skin:    General: Skin is warm and dry.     Capillary Refill: Capillary refill takes less than 2 seconds.     Findings: No rash.  Neurological:     Mental Status: He is alert. Mental status is at baseline.      ED Treatments / Results  Labs (all labs ordered are listed, but only abnormal results are displayed) Labs Reviewed  CBC WITH DIFFERENTIAL/PLATELET - Abnormal; Notable for the following components:      Result Value   Platelets 86 (*)    Monocytes Absolute 1.2 (*)    All other components within normal limits  URINALYSIS, ROUTINE W REFLEX MICROSCOPIC - Abnormal; Notable for the following components:   APPearance HAZY (*)    All other components within normal limits  CULTURE, BLOOD (ROUTINE X 2)  CULTURE, BLOOD (ROUTINE X 2)  COMPREHENSIVE METABOLIC PANEL  PROTIME-INR  I-STAT CG4 LACTIC ACID, ED  I-STAT CG4 LACTIC ACID, ED    EKG None  Radiology Dg Chest 2 View  Result Date: 11/23/2018 CLINICAL DATA:  Cough and fevers EXAM: CHEST - 2 VIEW COMPARISON:  03/16/2017 FINDINGS: The heart size and mediastinal contours are within normal limits. Both lungs are clear. The visualized skeletal structures are unremarkable. IMPRESSION: No active cardiopulmonary disease. Electronically Signed   By: Alcide CleverMark   Lukens M.D.   On: 11/23/2018 11:17    Procedures Procedures (including critical care time)  Medications Ordered in ED Medications - No data to display   Initial Impression / Assessment and Plan / ED Course  I have reviewed the triage vital signs and the nursing notes.  Pertinent labs & imaging results that were available during my care of the patient were reviewed by me and considered in my medical decision making (see chart for details).     Patient with onset of symptoms this morning with fever.  A little bit of a cough and some runny nose.  Patient has cerebral palsy [facility].  Patient seemed to be fine yesterday maybe a little off his usual self last evening.  Patient was given Tylenol 1 g by the group home at about 845 this morning.  Work-up here chest x-ray negative urinalysis negative no leukocytosis.  Lactic acid not elevated.  Electrolytes without significant abnormality.  Patient did have the flu shot this year.  We will treat clinically as if this may be onset of flulike illness.  Will treat with Tamiflu.  Patient's temperature improved here and did come down to 98.4 probably secondary to the Tylenol.  Patient will require close follow-up.  They will return for any new or worse symptoms.  Onset of symptoms were just today so is hard to say what direction this is going in.  Final Clinical Impressions(s) / ED Diagnoses   Final diagnoses:  Influenza-like illness    ED Discharge Orders         Ordered    oseltamivir (TAMIFLU) 75 MG capsule  Every 12 hours     11/23/18 1221           Vanetta MuldersZackowski, Maurice Ramseur, MD 11/23/18 1228

## 2018-11-28 LAB — CULTURE, BLOOD (ROUTINE X 2)
Culture: NO GROWTH
Culture: NO GROWTH
Special Requests: ADEQUATE
Special Requests: ADEQUATE

## 2019-01-15 ENCOUNTER — Other Ambulatory Visit: Payer: Self-pay | Admitting: Family Medicine

## 2019-01-15 DIAGNOSIS — H401332 Pigmentary glaucoma, bilateral, moderate stage: Secondary | ICD-10-CM | POA: Diagnosis not present

## 2019-03-04 ENCOUNTER — Other Ambulatory Visit: Payer: Self-pay | Admitting: Family Medicine

## 2019-03-24 IMAGING — CR DG CHEST 2V
1 series · 1 of 1 positions shown · non-contrast
Comparison: 03/16/2017

CLINICAL DATA: Cough and fevers

EXAM:
CHEST - 2 VIEW

[w chest lat]
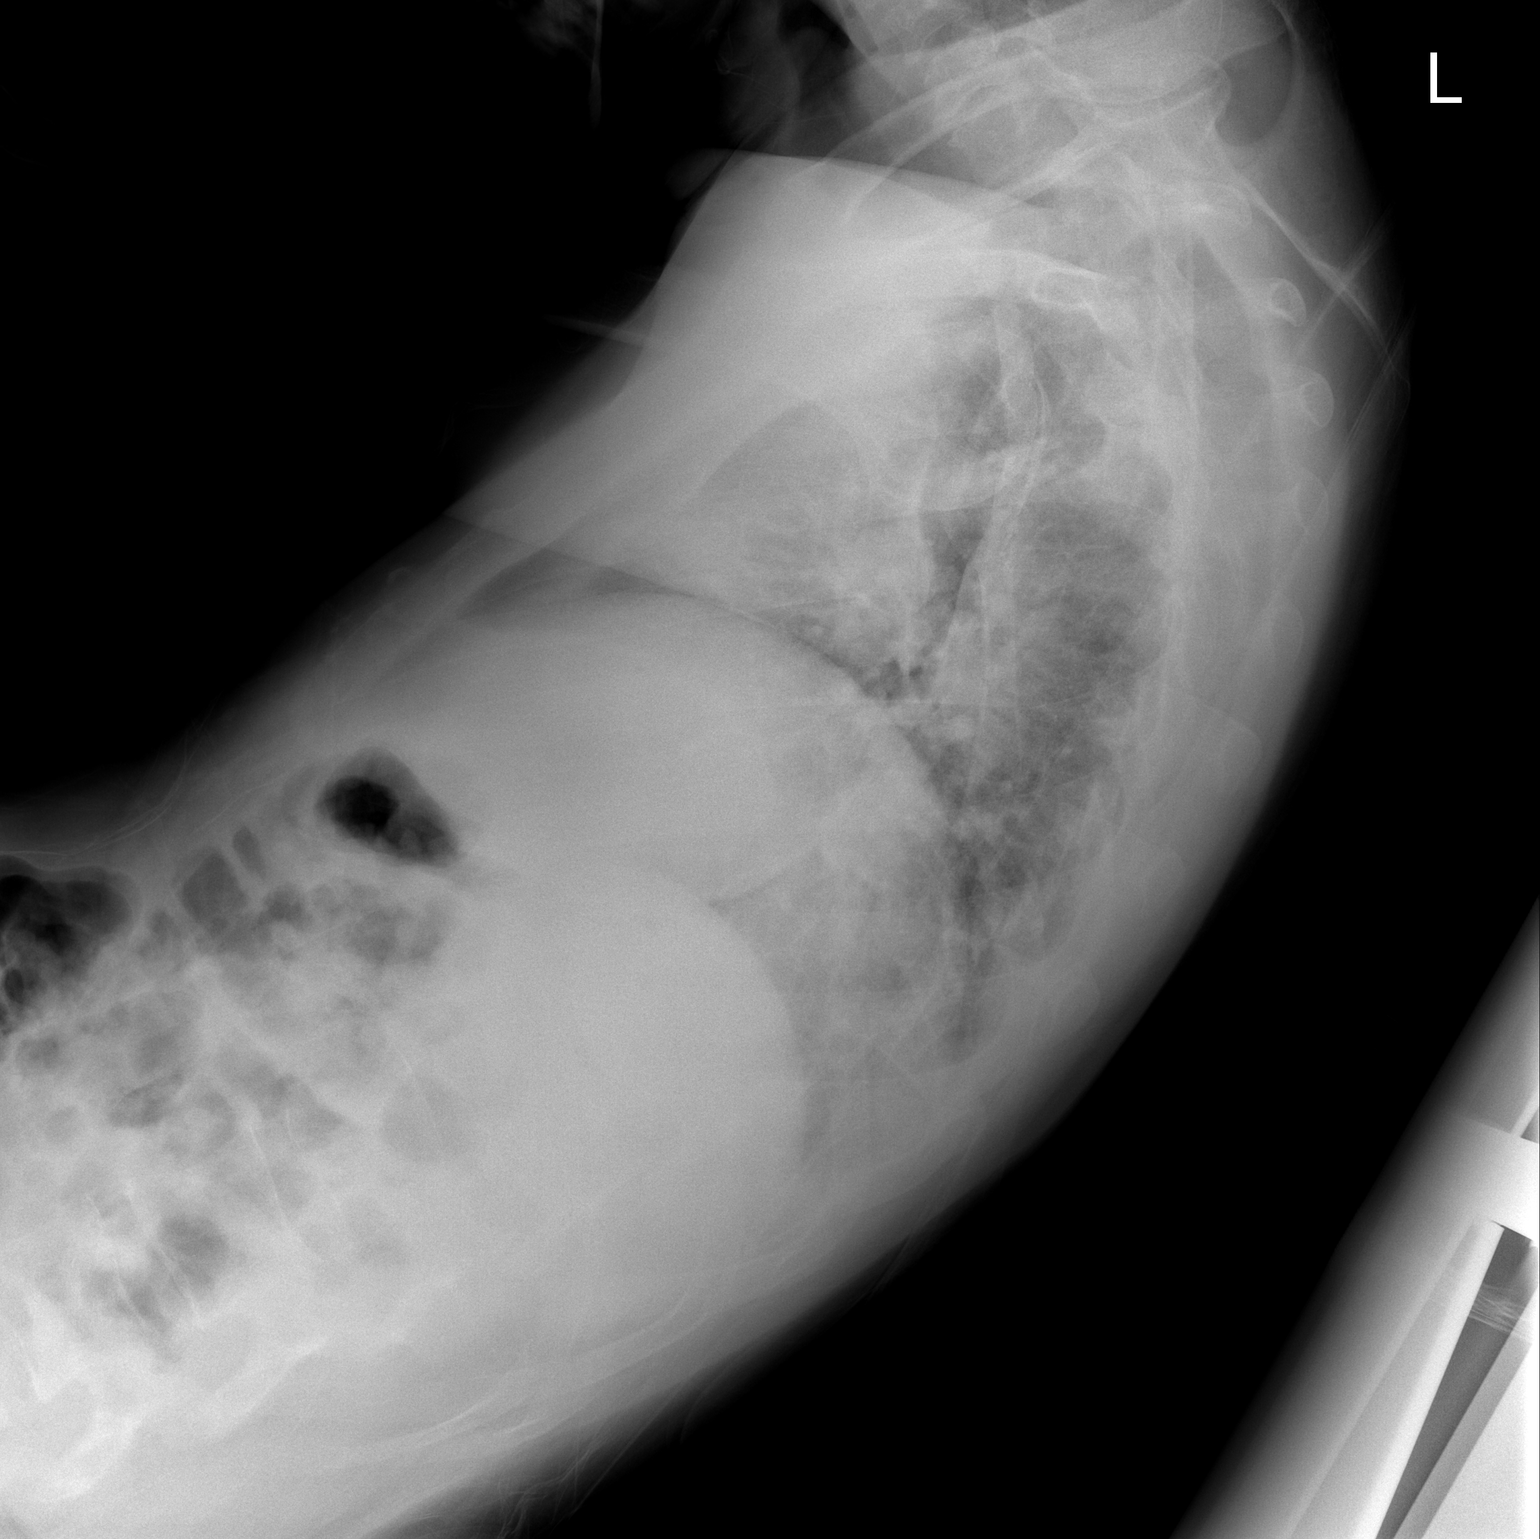

[1 of 1 positions shown; findings below may reference images not displayed]

FINDINGS: The heart size and mediastinal contours are within normal limits.
Both lungs are clear. The visualized skeletal structures are
unremarkable.
IMPRESSION: No active cardiopulmonary disease.

## 2019-04-11 ENCOUNTER — Other Ambulatory Visit: Payer: Self-pay | Admitting: Family Medicine

## 2019-05-14 ENCOUNTER — Ambulatory Visit (INDEPENDENT_AMBULATORY_CARE_PROVIDER_SITE_OTHER): Payer: Medicare Other | Admitting: Family Medicine

## 2019-05-14 ENCOUNTER — Ambulatory Visit: Payer: Medicare Other | Admitting: Family Medicine

## 2019-05-14 ENCOUNTER — Encounter: Payer: Self-pay | Admitting: Family Medicine

## 2019-05-14 ENCOUNTER — Other Ambulatory Visit: Payer: Self-pay

## 2019-05-14 VITALS — BP 124/80 | HR 96 | Ht 69.0 in | Wt 151.0 lb

## 2019-05-14 DIAGNOSIS — H6123 Impacted cerumen, bilateral: Secondary | ICD-10-CM

## 2019-05-14 DIAGNOSIS — L710 Perioral dermatitis: Secondary | ICD-10-CM

## 2019-05-14 DIAGNOSIS — M4120 Other idiopathic scoliosis, site unspecified: Secondary | ICD-10-CM | POA: Diagnosis not present

## 2019-05-14 DIAGNOSIS — E785 Hyperlipidemia, unspecified: Secondary | ICD-10-CM

## 2019-05-14 MED ORDER — CARRINGTON MOISTURE BARRIER EX CREA: TOPICAL_CREAM | CUTANEOUS | 0 refills | Status: AC | PRN

## 2019-05-14 MED ORDER — ONDANSETRON HCL 4 MG PO TABS: 4.0000 mg | ORAL_TABLET | Freq: Three times a day (TID) | ORAL | 1 refills | Status: DC | PRN

## 2019-05-14 MED ORDER — ENSURE HIGH PROTEIN PO LIQD: 1.0000 | Freq: Two times a day (BID) | ORAL | 10 refills | Status: DC

## 2019-05-14 MED ORDER — DEBROX 6.5 % OT SOLN: 5.0000 [drp] | OTIC | 5 refills | Status: DC

## 2019-05-14 MED ORDER — ROCKLATAN 0.02-0.005 % OP SOLN: 1.0000 [drp] | Freq: Two times a day (BID) | OPHTHALMIC | Status: DC

## 2019-05-14 NOTE — Patient Instructions (Signed)
It was great to see you!  Our plans for today:  - Refills sent to pharmacy - FL2 form signed  We are checking some labs today, we will call you or send you a letter if they are abnormal.   Take care and seek immediate care sooner if you develop any concerns.  Dr. [facility]

## 2019-05-14 NOTE — Progress Notes (Addendum)
Subjective:   Patient ID: Edward Alvarado    DOB: [birth year], 38 y.o. male   MRN: [id number]  Edward Alvarado is a 38 y.o. male with a history of allergic rhinitis, wheelchair bound secondary to cerebral palsy, open angle glaucoma, and hyperlipidemnia here for   FL2 form - Here for FL2 form to be signed - Reviewed medications and updated changes in chart - Currently using ensure twice per day. States he eats well, 3 meals per day. States he gets out for about 1 hour per day in his wheel chair for activity. -Sees dentist twice per year. -Goes to opthalmologist twice per year.  No history of blood clots. States he as not had a leg edema since 2012 or 2013.  HLD - Was seen 09/2018 by PCP and dose was decreased due to improvement in LDL - medications: atorvastatin 20mg   - compliance: Tolerating well - medication SEs: denies muscle cramps  Review of Systems:  Per HPI.  PMFSH, medications and smoking status reviewed.  Objective:   BP 124/80    Pulse 96    Ht 5\' 9"  (1.753 m)    Wt 151 lb (68.5 kg)    SpO2 96%    BMI 22.30 kg/m  Vitals and nursing note reviewed.  General: well nourished, well developed, in no acute distress with non-toxic appearance. Seated in wheelchair. HEENT: normocephalic, atraumatic, moist mucous membranes Neck: supple, non-tender without lymphadenopathy CV: regular rate and rhythm without murmurs, rubs, or gallops, no lower extremity edema Lungs: clear to auscultation bilaterally with normal work of breathing Abdomen: soft, non-tender, non-distended, no masses or organomegaly palpable, normoactive bowel sounds Skin: warm, dry, no rashes or lesions Extremities: warm and well perfused, normal tone MSK: ROM grossly intact, strength intact, gait normal Neuro: Alert and oriented, speech normal  Assessment & Plan:   Hyperlipidemia Tolerating decreased statin dose well without side effects. Obtaining updated lipid panel today  FL2 form signed, chart updated. No  concerns today. Medications reordered. Encouraged continued diet and frequent outings around the neighborhood for mental health.  Orders Placed This Encounter  Procedures   Lipid Panel   Meds ordered this encounter  Medications   carbamide peroxide (DEBROX) 6.5 % OTIC solution    Sig: Place 5 drops into both ears once a week.    Dispense:  15 mL    Refill:  5   ondansetron (ZOFRAN) 4 MG tablet    Sig: Take 1 tablet (4 mg total) by mouth every 8 (eight) hours as needed for nausea or vomiting.    Dispense:  20 tablet    Refill:  1   Netarsudil-Latanoprost (ROCKLATAN) 0.02-0.005 % SOLN    Sig: Apply 1 drop to eye 2 (two) times a day.   Nutritional Supplements (ENSURE HIGH PROTEIN) LIQD    Sig: Take 1 Bottle by mouth 2 (two) times daily.    Dispense:  414 mL    Refill:  10   Skin Protectants, Misc. (EUCERIN) cream    Sig: Apply topically as needed (dry skin). Generic okay    Dispense:  397 g    Refill:  0    Jackelyn Polingyan Welborn, DO PGY-1, [facility] 05/14/2019 9:56 AM    I personally saw and evaluated the patient, performing the key elements of the service. I developed and verified the management plan that is described in the resident's note, and I agree with the content with my edits above.   Ellwood DenseAlison Rumball, DO PGY-2, [facility] 05/14/2019 10:35 AM

## 2019-05-14 NOTE — Assessment & Plan Note (Signed)
Tolerating decreased statin dose well without side effects. Obtaining updated lipid panel today

## 2019-05-15 LAB — LIPID PANEL
Chol/HDL Ratio: 5 ratio (ref 0.0–5.0)
Cholesterol, Total: 191 mg/dL (ref 100–199)
HDL: 38 mg/dL — ABNORMAL LOW (ref >39)
LDL Calculated: 109 mg/dL — ABNORMAL HIGH (ref 0–99)
Triglycerides: 222 mg/dL — ABNORMAL HIGH (ref 0–149)
VLDL Cholesterol Cal: 44 mg/dL — ABNORMAL HIGH (ref 5–40)

## 2019-05-16 ENCOUNTER — Telehealth: Payer: Self-pay | Admitting: *Deleted

## 2019-05-16 NOTE — Telephone Encounter (Signed)
Mom states that the group home needs a written order to discontinue statin. Printed and placed at front for pickup. Christen Bame, CMA

## 2019-06-04 DIAGNOSIS — Z961 Presence of intraocular lens: Secondary | ICD-10-CM | POA: Diagnosis not present

## 2019-06-04 DIAGNOSIS — H401332 Pigmentary glaucoma, bilateral, moderate stage: Secondary | ICD-10-CM | POA: Diagnosis not present

## 2019-07-29 ENCOUNTER — Other Ambulatory Visit: Payer: Self-pay

## 2019-07-29 ENCOUNTER — Ambulatory Visit (INDEPENDENT_AMBULATORY_CARE_PROVIDER_SITE_OTHER): Payer: Medicare Other | Admitting: *Deleted

## 2019-07-29 DIAGNOSIS — Z23 Encounter for immunization: Secondary | ICD-10-CM

## 2019-07-29 NOTE — Progress Notes (Signed)
Pt tolerate vaccine well. Jelene Albano Kennon Holter, CMA

## 2019-09-05 ENCOUNTER — Other Ambulatory Visit: Payer: Self-pay | Admitting: *Deleted

## 2019-09-05 MED ORDER — VITAMIN D3 25 MCG (1000 UNIT) PO TABS: ORAL_TABLET | ORAL | 11 refills | Status: DC

## 2019-09-30 ENCOUNTER — Other Ambulatory Visit: Payer: Self-pay

## 2019-09-30 DIAGNOSIS — Z20828 Contact with and (suspected) exposure to other viral communicable diseases: Secondary | ICD-10-CM | POA: Diagnosis not present

## 2019-09-30 DIAGNOSIS — Z20822 Contact with and (suspected) exposure to covid-19: Secondary | ICD-10-CM

## 2019-10-02 ENCOUNTER — Telehealth: Payer: Self-pay | Admitting: *Deleted

## 2019-10-02 LAB — NOVEL CORONAVIRUS, NAA: SARS-CoV-2, NAA: NOT DETECTED

## 2019-10-03 ENCOUNTER — Telehealth: Payer: Self-pay | Admitting: Family Medicine

## 2019-10-03 NOTE — Telephone Encounter (Signed)
° °  Pt Mom rec neg COVID results  °

## 2019-10-14 ENCOUNTER — Other Ambulatory Visit: Payer: Self-pay

## 2019-10-14 DIAGNOSIS — Z20822 Contact with and (suspected) exposure to covid-19: Secondary | ICD-10-CM

## 2019-10-14 DIAGNOSIS — Z20828 Contact with and (suspected) exposure to other viral communicable diseases: Secondary | ICD-10-CM | POA: Diagnosis not present

## 2019-10-15 LAB — NOVEL CORONAVIRUS, NAA: SARS-CoV-2, NAA: NOT DETECTED

## 2019-10-16 ENCOUNTER — Telehealth: Payer: Self-pay | Admitting: *Deleted

## 2019-10-16 NOTE — Telephone Encounter (Signed)
Patient's dad given negative covid results .

## 2019-10-30 ENCOUNTER — Other Ambulatory Visit: Payer: Medicare Other

## 2019-10-31 ENCOUNTER — Other Ambulatory Visit: Payer: Self-pay

## 2019-10-31 ENCOUNTER — Ambulatory Visit: Payer: Medicare Other | Attending: Internal Medicine

## 2019-10-31 DIAGNOSIS — Z20822 Contact with and (suspected) exposure to covid-19: Secondary | ICD-10-CM

## 2019-10-31 DIAGNOSIS — Z20828 Contact with and (suspected) exposure to other viral communicable diseases: Secondary | ICD-10-CM | POA: Diagnosis not present

## 2019-11-02 LAB — NOVEL CORONAVIRUS, NAA: SARS-CoV-2, NAA: NOT DETECTED

## 2019-11-04 ENCOUNTER — Telehealth: Payer: Self-pay

## 2019-11-04 NOTE — Telephone Encounter (Signed)
Pt notified of negative COVID-19 results. Understanding verbalized.  Chasta M Hopkins   

## 2019-11-05 DIAGNOSIS — H401332 Pigmentary glaucoma, bilateral, moderate stage: Secondary | ICD-10-CM | POA: Diagnosis not present

## 2019-11-06 ENCOUNTER — Other Ambulatory Visit: Payer: Self-pay | Admitting: Family Medicine

## 2019-11-11 ENCOUNTER — Ambulatory Visit: Payer: Medicare Other | Attending: Internal Medicine

## 2019-11-11 DIAGNOSIS — Z20822 Contact with and (suspected) exposure to covid-19: Secondary | ICD-10-CM

## 2019-11-11 DIAGNOSIS — Z20828 Contact with and (suspected) exposure to other viral communicable diseases: Secondary | ICD-10-CM | POA: Diagnosis not present

## 2019-11-13 LAB — NOVEL CORONAVIRUS, NAA: SARS-CoV-2, NAA: NOT DETECTED

## 2019-12-09 ENCOUNTER — Telehealth: Payer: Self-pay

## 2019-12-09 ENCOUNTER — Other Ambulatory Visit: Payer: Self-pay | Admitting: Family Medicine

## 2019-12-09 DIAGNOSIS — E785 Hyperlipidemia, unspecified: Secondary | ICD-10-CM

## 2019-12-09 DIAGNOSIS — R634 Abnormal weight loss: Secondary | ICD-10-CM

## 2019-12-09 DIAGNOSIS — E559 Vitamin D deficiency, unspecified: Secondary | ICD-10-CM

## 2019-12-09 NOTE — Telephone Encounter (Signed)
Mother calls nurse line with questions regarding f/u appointment next week. Mother states that patient is scheduled for office visit next week, however, she wanted to see if he could have his labs drawn this week so that they could be discussed in further detail at office visit.   No future labs scheduled  Please advise  To PCP  Veronda Prude, RN

## 2019-12-10 NOTE — Telephone Encounter (Signed)
Lab visit scheduled for tomorrow morning (1/27).   Veronda Prude, RN

## 2019-12-11 ENCOUNTER — Other Ambulatory Visit: Payer: Self-pay

## 2019-12-11 ENCOUNTER — Other Ambulatory Visit: Payer: Medicare Other

## 2019-12-11 DIAGNOSIS — R634 Abnormal weight loss: Secondary | ICD-10-CM

## 2019-12-11 DIAGNOSIS — E785 Hyperlipidemia, unspecified: Secondary | ICD-10-CM

## 2019-12-11 DIAGNOSIS — E559 Vitamin D deficiency, unspecified: Secondary | ICD-10-CM | POA: Diagnosis not present

## 2019-12-12 LAB — COMPREHENSIVE METABOLIC PANEL
ALT: 29 IU/L (ref 0–44)
AST: 23 IU/L (ref 0–40)
Albumin/Globulin Ratio: 1.7 (ref 1.2–2.2)
Albumin: 4.4 g/dL (ref 4.0–5.0)
Alkaline Phosphatase: 73 IU/L (ref 39–117)
BUN/Creatinine Ratio: 18 (ref 9–20)
BUN: 15 mg/dL (ref 6–20)
Bilirubin Total: 1 mg/dL (ref 0.0–1.2)
CO2: 20 mmol/L (ref 20–29)
Calcium: 9.3 mg/dL (ref 8.7–10.2)
Chloride: 104 mmol/L (ref 96–106)
Creatinine, Ser: 0.82 mg/dL (ref 0.76–1.27)
GFR calc Af Amer: 129 mL/min/{1.73_m2} (ref >59)
GFR calc non Af Amer: 111 mL/min/{1.73_m2} (ref >59)
Globulin, Total: 2.6 g/dL (ref 1.5–4.5)
Glucose: 89 mg/dL (ref 65–99)
Potassium: 4.3 mmol/L (ref 3.5–5.2)
Sodium: 139 mmol/L (ref 134–144)
Total Protein: 7 g/dL (ref 6.0–8.5)

## 2019-12-12 LAB — LIPID PANEL
Chol/HDL Ratio: 6.6 ratio — ABNORMAL HIGH (ref 0.0–5.0)
Cholesterol, Total: 257 mg/dL — ABNORMAL HIGH (ref 100–199)
HDL: 39 mg/dL — ABNORMAL LOW (ref >39)
LDL Chol Calc (NIH): 188 mg/dL — ABNORMAL HIGH (ref 0–99)
Triglycerides: 161 mg/dL — ABNORMAL HIGH (ref 0–149)
VLDL Cholesterol Cal: 30 mg/dL (ref 5–40)

## 2019-12-12 LAB — VITAMIN D 25 HYDROXY (VIT D DEFICIENCY, FRACTURES): Vit D, 25-Hydroxy: 39.5 ng/mL (ref 30.0–100.0)

## 2019-12-17 ENCOUNTER — Ambulatory Visit (INDEPENDENT_AMBULATORY_CARE_PROVIDER_SITE_OTHER): Payer: Medicare Other | Admitting: Family Medicine

## 2019-12-17 ENCOUNTER — Other Ambulatory Visit: Payer: Self-pay

## 2019-12-17 ENCOUNTER — Encounter: Payer: Self-pay | Admitting: Family Medicine

## 2019-12-17 VITALS — BP 110/75 | HR 90 | Temp 98.6°F | Wt 158.0 lb

## 2019-12-17 DIAGNOSIS — E785 Hyperlipidemia, unspecified: Secondary | ICD-10-CM

## 2019-12-17 DIAGNOSIS — R0982 Postnasal drip: Secondary | ICD-10-CM

## 2019-12-17 DIAGNOSIS — E559 Vitamin D deficiency, unspecified: Secondary | ICD-10-CM

## 2019-12-17 DIAGNOSIS — G800 Spastic quadriplegic cerebral palsy: Secondary | ICD-10-CM | POA: Diagnosis not present

## 2019-12-17 DIAGNOSIS — H6123 Impacted cerumen, bilateral: Secondary | ICD-10-CM

## 2019-12-17 DIAGNOSIS — H900 Conductive hearing loss, bilateral: Secondary | ICD-10-CM | POA: Diagnosis not present

## 2019-12-17 DIAGNOSIS — J301 Allergic rhinitis due to pollen: Secondary | ICD-10-CM

## 2019-12-17 DIAGNOSIS — Z8639 Personal history of other endocrine, nutritional and metabolic disease: Secondary | ICD-10-CM | POA: Diagnosis not present

## 2019-12-17 MED ORDER — FLUTICASONE PROPIONATE 50 MCG/ACT NA SUSP: 2.0000 | Freq: Every day | NASAL | 6 refills | Status: DC

## 2019-12-17 NOTE — Patient Instructions (Addendum)
It was a pleasure meeting you all today!  1. Diet: Edward Alvarado is at a healthy weight today, he no longer needs ensure supplements. His cholesterol and LDL (bad cholesterol) are on the higher end. To treat this we will first start with a plant-based diet with unsaturated fats. An example of this is the [place] diet, which has unsaturated fats such as nuts and olive oil. Sunflower seed, grape seed, coconut, and olive oil are better options for cooking. Reduce dairy products such as butter and milk, acceptable alternatives are soy or almond milk. He can have dairy products, but not every day. For example, he can have low sugar yogurt (not low fat) once or twice a week, but not every day. He can have 2% milk, not every day, maybe once or twice a week maximum (and milk alternatives such as soy or almond are preferred). Low-fat products are not preferred as they tend to be higher in carbohydrates. Health fats, and limiting consumption of unhealthy fats and animal products is preferred. Focus on lean proteins such as chicken, fish, and pork. No fried foods, baked or grilled is a better option. Add in whole grains and vegetables as the main stay of the diet. In 3 months we will check his lipid panel again, if no change, we can consider a statin or further dietary changes.  2. Runny nose: flonase spray twice a day in each nostril will help eliminate runny nose.  3. Debrox: 5-10 drops in each ear twice a day for 4 days and repeat every month.  Mediterranean Diet A Mediterranean diet refers to food and lifestyle choices that are based on the traditions of countries located on the [employer]. This way of eating has been shown to help prevent certain conditions and improve outcomes for people who have chronic diseases, like kidney disease and heart disease. What are tips for following this plan? Lifestyle  Cook and eat meals together with your family, when possible.  Drink enough fluid to keep your urine  clear or pale yellow.  Be physically active every day. This includes: ? Aerobic exercise like running or swimming. ? Leisure activities like gardening, walking, or housework.  Get 7-8 hours of sleep each night.  If recommended by your health care provider, drink red wine in moderation. This means 1 glass a day for nonpregnant women and 2 glasses a day for men. A glass of wine equals 5 oz (150 mL). Reading food labels   Check the serving size of packaged foods. For foods such as rice and pasta, the serving size refers to the amount of cooked product, not dry.  Check the total fat in packaged foods. Avoid foods that have saturated fat or trans fats.  Check the ingredients list for added sugars, such as corn syrup. Shopping  At the grocery store, buy most of your food from the areas near the walls of the store. This includes: ? Fresh fruits and vegetables (produce). ? Grains, beans, nuts, and seeds. Some of these may be available in unpackaged forms or large amounts (in bulk). ? Fresh seafood. ? Poultry and eggs. ? Low-fat dairy products.  Buy whole ingredients instead of prepackaged foods.  Buy fresh fruits and vegetables in-season from local farmers markets.  Buy frozen fruits and vegetables in resealable bags.  If you do not have access to quality fresh seafood, buy precooked frozen shrimp or canned fish, such as tuna, salmon, or sardines.  Buy small amounts of raw or cooked vegetables, salads, or  olives from the deli or salad bar at your store.  Stock your pantry so you always have certain foods on hand, such as olive oil, canned tuna, canned tomatoes, rice, pasta, and beans. Cooking  Cook foods with extra-virgin olive oil instead of using butter or other vegetable oils.  Have meat as a side dish, and have vegetables or grains as your main dish. This means having meat in small portions or adding small amounts of meat to foods like pasta or stew.  Use beans or vegetables  instead of meat in common dishes like chili or lasagna.  Experiment with different cooking methods. Try roasting or broiling vegetables instead of steaming or sauteing them.  Add frozen vegetables to soups, stews, pasta, or rice.  Add nuts or seeds for added healthy fat at each meal. You can add these to yogurt, salads, or vegetable dishes.  Marinate fish or vegetables using olive oil, lemon juice, garlic, and fresh herbs. Meal planning   Plan to eat 1 vegetarian meal one day each week. Try to work up to 2 vegetarian meals, if possible.  Eat seafood 2 or more times a week.  Have healthy snacks readily available, such as: ? Vegetable sticks with hummus. ? [place] yogurt. ? Fruit and nut trail mix.  Eat balanced meals throughout the week. This includes: ? Fruit: 2-3 servings a day ? Vegetables: 4-5 servings a day ? Low-fat dairy: 2 servings a day ? Fish, poultry, or lean meat: 1 serving a day ? Beans and legumes: 2 or more servings a week ? Nuts and seeds: 1-2 servings a day ? Whole grains: 6-8 servings a day ? Extra-virgin olive oil: 3-4 servings a day  Limit red meat and sweets to only a few servings a month What are my food choices?  Mediterranean diet ? Recommended  Grains: Whole-grain pasta. Brown rice. Bulgar wheat. Polenta. Couscous. Whole-wheat bread. [place].  Vegetables: Artichokes. Beets. Broccoli. Cabbage. Carrots. Eggplant. Green beans. Chard. Kale. Spinach. Onions. Leeks. Peas. Squash. Tomatoes. Peppers. Radishes.  Fruits: Apples. Apricots. Avocado. Berries. Bananas. Cherries. Dates. Figs. Grapes. Lemons. Melon. Oranges. Peaches. Plums. Pomegranate.  Meats and other protein foods: Beans. Almonds. Sunflower seeds. Pine nuts. Peanuts. [place]. Salmon. Scallops. Shrimp. [place]. Tilapia. Clams. Oysters. Eggs.  Dairy: Low-fat milk. Cheese. Greek yogurt.  Beverages: Water. Red wine. Herbal tea.  Fats and oils: Extra virgin olive oil. Avocado oil. Grape seed  oil.  Sweets and desserts: [place] yogurt with honey. Baked apples. Poached pears. Trail mix.  Seasoning and other foods: Basil. Cilantro. Coriander. Cumin. Mint. Parsley. Sage. Rosemary. Tarragon. Garlic. Oregano. Thyme. Pepper. Balsalmic vinegar. Tahini. Hummus. Tomato sauce. Olives. Mushrooms. ? Limit these  Grains: Prepackaged pasta or rice dishes. Prepackaged cereal with added sugar.  Vegetables: Deep fried potatoes (french fries).  Fruits: Fruit canned in syrup.  Meats and other protein foods: Beef. Pork. Lamb. Poultry with skin. Hot dogs. Bernie[place] Salines.  Dairy: Ice cream. Sour cream. Whole milk.  Beverages: Juice. Sugar-sweetened soft drinks. Beer. Liquor and spirits.  Fats and oils: Butter. Canola oil. Vegetable oil. Beef fat (tallow). Lard.  Sweets and desserts: Cookies. Cakes. Pies. Candy.  Seasoning and other foods: Mayonnaise. Premade sauces and marinades. The items listed may not be a complete list. Talk with your dietitian about what dietary choices are right for you. Summary  The Mediterranean diet includes both food and lifestyle choices.  Eat a variety of fresh fruits and vegetables, beans, nuts, seeds, and whole grains.  Limit the amount of red meat  and sweets that you eat.  Talk with your health care provider about whether it is safe for you to drink red wine in moderation. This means 1 glass a day for nonpregnant women and 2 glasses a day for men. A glass of wine equals 5 oz (150 mL). This information is not intended to replace advice given to you by your health care provider. Make sure you discuss any questions you have with your health care provider. Document Revised: 06/30/2016 Document Reviewed: 06/23/2016 Elsevier Patient Education  2020 [employer].

## 2019-12-19 ENCOUNTER — Encounter: Payer: Self-pay | Admitting: Family Medicine

## 2019-12-19 DIAGNOSIS — Z8639 Personal history of other endocrine, nutritional and metabolic disease: Secondary | ICD-10-CM | POA: Insufficient documentation

## 2019-12-19 NOTE — Assessment & Plan Note (Signed)
Impaction still present. Recommend debrox 5-12 drops in each ear BID x 4 days. Repeat once a month.

## 2019-12-19 NOTE — Assessment & Plan Note (Signed)
Patient understands ASL

## 2019-12-19 NOTE — Assessment & Plan Note (Signed)
Vit D level is WNL, at 39.5

## 2019-12-19 NOTE — Assessment & Plan Note (Addendum)
Lipid panel reveals elevated total cholesterol 257 and LDL 188. He has family h/o hyperlipidemia, but no h/o sudden cardiac death, or cardiac death at age >40 yo. Patient would be best served at this point in time with dietary management of hyperlipidemia vs medical management, and family is in agreement. Dietary changes are difficult due to patient living in a group home. Recommend a plant-based diet with emphasis on unsaturated fats, such as the mediterranean diet. Will give detailed instructions for group home care.  - Follow up in three months to assess progress

## 2019-12-19 NOTE — Assessment & Plan Note (Signed)
Patient is doing well and is at his neurological baseline according to parents. He is wheelchair bound for life.

## 2019-12-19 NOTE — Assessment & Plan Note (Signed)
Signs of allergic rhinitis on exam, clear nasal discharge, no evidence of edematous nasal passages. Has used azelastine and singulair without improvement for several years. - Start flonase 1 spray each nostril qd

## 2019-12-19 NOTE — Assessment & Plan Note (Signed)
Patient previously experienced malnutrition in 2019 when his weight was 121 lbs and BMI was 17. Today patient's weight is appropriate and CMP reveals no evidence of malnutrition. - Discontinue Ensure nutritional supplements - Diet for improved cholesterol is not a caloric restriction, but to decrease animal products and saturated fats that contribute to hyperlipidemia.

## 2019-12-19 NOTE — Progress Notes (Signed)
CHIEF COMPLAINT / HPI: annual physical, diet, runny nose, ear wax Edward Alvarado presents today with both of his parents and an in-person ASL interpreter. He is here for his annual visit, and to follow up on several issues: weight, diet, cholesterol, vit d deficiency, runny nose, and ear wax  Annual exam: CMP obtained last week is completely WNL, reassuring about patient's health status, particularly as he was diagnosed with malnutrition in 2019 with BMI of 17 at that time. Patient's BMI is appropriate today at 23.   Cerumen impaction: Parents note that it is difficult to get group home to follow up with regularly using debrox.  Runny nose: Parents report that patient has constant runny nose, sometimes it lasts throughout dinner and he has to wipe his nose 5-6 times. This is particularly annoying to the patient. He has been on azelastine and singulair for several   PERTINENT  PMH / PSH: cerebral palsy, needs ASL interpreter, hyperlipidemia, h/o malnutrition   OBJECTIVE: BP 110/75   Pulse 90   Temp 98.6 F (37 C) (Oral)   Wt 158 lb (71.7 kg)   SpO2 97%   BMI 23.33 kg/m   Physical Exam Constitutional:      General: He is not in acute distress.    Appearance: Normal appearance. He is normal weight. He is not ill-appearing, toxic-appearing or diaphoretic.  HENT:     Head: Normocephalic and atraumatic.     Right Ear: External ear normal. There is impacted cerumen.     Left Ear: External ear normal. There is impacted cerumen.     Nose: Rhinorrhea present.     Comments: No erythema, edema of nasal passageways     Mouth/Throat:     Mouth: Mucous membranes are moist.     Pharynx: Oropharynx is clear. No oropharyngeal exudate or posterior oropharyngeal erythema.  Eyes:     General: No scleral icterus.    Conjunctiva/sclera: Conjunctivae normal.     Pupils: Pupils are equal, round, and reactive to light.  Cardiovascular:     Rate and Rhythm: Normal rate and regular rhythm.     Pulses:  Normal pulses.     Heart sounds: Normal heart sounds. No murmur. No friction rub. No gallop.   Pulmonary:     Effort: Pulmonary effort is normal. No respiratory distress.     Breath sounds: Normal breath sounds. No stridor. No wheezing, rhonchi or rales.  Abdominal:     General: Abdomen is flat. Bowel sounds are normal. There is no distension.     Palpations: Abdomen is soft.     Tenderness: There is no abdominal tenderness. There is no guarding or rebound.  Musculoskeletal:        General: No tenderness.  Skin:    General: Skin is warm and dry.     Capillary Refill: Capillary refill takes less than 2 seconds.     Findings: No bruising, erythema or rash.  Neurological:     Mental Status: He is alert and oriented to person, place, and time. Mental status is at baseline.     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        Behavior: Behavior normal.    ASSESSMENT / PLAN: Edward Alvarado is a healthy 39 yo man with dyslipidemia.  Hyperlipidemia Lipid panel reveals elevated total cholesterol 257 and LDL 188. He has family h/o hyperlipidemia, but no h/o sudden cardiac  death, or cardiac death at age >73 yo. Patient would be best served at this point in time with dietary management of hyperlipidemia vs medical management, and family is in agreement. Dietary changes are difficult due to patient living in a group home. Recommend a plant-based diet with emphasis on unsaturated fats, such as the mediterranean diet. Will give detailed instructions for group home care.  - Follow up in three months to assess progress  Vitamin D insufficiency Vit D level is WNL, at 39.5  Cerumen impaction Impaction still present. Recommend debrox 5-12 drops in each ear BID x 4 days. Repeat once a month.  Allergic rhinitis Signs of allergic rhinitis on exam, clear nasal discharge, no evidence of edematous nasal passages. Has used  azelastine and singulair without improvement for several years. - Start flonase 1 spray each nostril qd  LOSS, CONDUCTIVE HEARING, BILATERAL Patient understands ASL  Cerebral palsy (HCC) Patient is doing well and is at his neurological baseline according to parents. He is wheelchair bound for life.   History of malnutrition Patient previously experienced malnutrition in 2019 when his weight was 121 lbs and BMI was 17. Today patient's weight is appropriate and CMP reveals no evidence of malnutrition. - Discontinue Ensure nutritional supplements - Diet for improved cholesterol is not a caloric restriction, but to decrease animal products and saturated fats that contribute to hyperlipidemia.    [name], MD [facility], PGY-1

## 2020-01-08 ENCOUNTER — Telehealth: Payer: Self-pay | Admitting: Family Medicine

## 2020-01-08 NOTE — Telephone Encounter (Signed)
[employer] form dropped off for at front desk for completion.  Verified that patient section of form has been completed.  Last DOS/WCC with PCP was 12-17-19 .  Placed form in blue team folder to be completed by clinical staff.  Edward Alvarado

## 2020-01-09 NOTE — Telephone Encounter (Signed)
Clinical info completed on Dental form.  Place form in PCP's box for completion.  Aquilla Solian, CMA

## 2020-01-09 NOTE — Telephone Encounter (Signed)
Form completed and placed in To Fax basket. Original copy placed at front desk for family to pick up if they would like copy for their records.   [name], MD [facility], PGY-1

## 2020-01-10 NOTE — Telephone Encounter (Signed)
Attempted to reach pt. LVM of note. Tashima Scarpulla, CMA  

## 2020-03-10 DIAGNOSIS — H1089 Other conjunctivitis: Secondary | ICD-10-CM | POA: Diagnosis not present

## 2020-03-16 ENCOUNTER — Encounter: Payer: Self-pay | Admitting: Family Medicine

## 2020-03-23 ENCOUNTER — Telehealth: Payer: Self-pay | Admitting: *Deleted

## 2020-03-23 NOTE — Telephone Encounter (Signed)
Pt has contacted [facility] for a w/c.    They will need an order for PT/OT eval before they can send paperwork to [place].   Advised that we typically send to neuro-rehab for the seat eval and then they send forms to [place] for completion.  Will forward to PCP for next steps and to also check with pt at appt if he is ok with neuro-rehab or if he prefers [employer].  Jone Baseman, CMA

## 2020-03-29 ENCOUNTER — Other Ambulatory Visit: Payer: Self-pay | Admitting: Family Medicine

## 2020-03-29 DIAGNOSIS — G800 Spastic quadriplegic cerebral palsy: Secondary | ICD-10-CM

## 2020-03-29 NOTE — Telephone Encounter (Signed)
Patient attempting to get new wheel chair. He has quadriplegic cerebral palsy. Will place referral to neuro rehab for seat evaluation. Patient will follow up with me on 04/17/20.  [name], MD [facility], PGY-1

## 2020-04-07 DIAGNOSIS — H401332 Pigmentary glaucoma, bilateral, moderate stage: Secondary | ICD-10-CM | POA: Diagnosis not present

## 2020-04-07 DIAGNOSIS — Z961 Presence of intraocular lens: Secondary | ICD-10-CM | POA: Diagnosis not present

## 2020-04-17 ENCOUNTER — Encounter: Payer: Self-pay | Admitting: Family Medicine

## 2020-04-17 ENCOUNTER — Other Ambulatory Visit: Payer: Self-pay

## 2020-04-17 ENCOUNTER — Ambulatory Visit (INDEPENDENT_AMBULATORY_CARE_PROVIDER_SITE_OTHER): Payer: Medicare Other | Admitting: Family Medicine

## 2020-04-17 VITALS — BP 128/84 | HR 90 | Ht 69.0 in | Wt 154.8 lb

## 2020-04-17 DIAGNOSIS — Z7689 Persons encountering health services in other specified circumstances: Secondary | ICD-10-CM | POA: Diagnosis not present

## 2020-04-17 DIAGNOSIS — G800 Spastic quadriplegic cerebral palsy: Secondary | ICD-10-CM | POA: Diagnosis not present

## 2020-04-17 DIAGNOSIS — Z993 Dependence on wheelchair: Secondary | ICD-10-CM | POA: Diagnosis not present

## 2020-04-17 DIAGNOSIS — E785 Hyperlipidemia, unspecified: Secondary | ICD-10-CM

## 2020-04-17 NOTE — Assessment & Plan Note (Signed)
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  Nonambulatory.  Motorized wheelchair is his only means of transportation and a cane, walker, nonmotorized wheelchair will not suffice as patient has paralysis in 3 limbs. He can use a PMD safely in his home as he has been doing so for many years; patient is cognitively intact.  Request for new motorized wheelchair is appropriate based on patient's diagnoses and my exam. - referral to neurological rehabilitation and PT for mobility evaluation

## 2020-04-17 NOTE — Assessment & Plan Note (Signed)
Patient has been doing Mediterranean diet for several months.  We will recheck lipid panel in 3 months.  Appointment scheduled and future lab value for lipid panel and hemoglobin A1c entered

## 2020-04-17 NOTE — Patient Instructions (Signed)
It was a pleasure to see you today!  We did an exam to evaluate you for a replacement wheelchair.  We can check your cholesterol and hgb a1c in 3 months.   The neurologic physical therapy office will call to schedule an appointment.  Be Well!  Dr. Leary Roca

## 2020-04-17 NOTE — Progress Notes (Signed)
SUBJECTIVE:   CHIEF COMPLAINT / HPI: evaluation for mobility  Mobility examination: patient has a history of cerebral palsy and spastic paralysis of the bilateral lower extremities as well as the right upper extremity.  Patient is nonambulatory due to this lifetime diagnosis and only method of getting around is a motorized wheelchair.  Edward Alvarado has had 2 so far and the current one is about 40 years old, and the control device intermittently will not work, which is why Edward Alvarado is seeking a new chair today.  A motorized wheelchair is his only means of transport.  Edward Alvarado is unable to perform his ADLs.  A cane or walker will not meet this patient's mobility needs as Edward Alvarado is nonambulatory and has spastic paralysis.  A manual wheelchair will also not meet this patient's mobility needs as Edward Alvarado has spastic paralysis of his right arm.  Patient does have the physical and mental abilities to operate a motorized wheelchair safely in his home as Edward Alvarado has been doing it for many years at this point.  PERTINENT  PMH / PSH: CP, spastic paralysis  OBJECTIVE:   BP 128/84   Pulse 90   Ht 5\' 9"  (1.753 m)   Wt 70.2 kg   SpO2 97%   BMI 22.86 kg/m   Physical Exam Vitals and nursing note reviewed.  Constitutional:      General: Edward Alvarado is not in acute distress.    Appearance: Normal appearance. Edward Alvarado is normal weight. Edward Alvarado is not ill-appearing, toxic-appearing or diaphoretic.  HENT:     Head: Normocephalic and atraumatic.     Nose: Nose normal.     Mouth/Throat:     Mouth: Mucous membranes are moist.     Pharynx: Oropharynx is clear.  Eyes:     Extraocular Movements: Extraocular movements intact.     Conjunctiva/sclera: Conjunctivae normal.     Pupils: Pupils are equal, round, and reactive to light.  Cardiovascular:     Rate and Rhythm: Normal rate and regular rhythm.     Pulses: Normal pulses.     Heart sounds: Normal heart sounds. No murmur. No friction rub. No gallop.   Pulmonary:     Effort: Pulmonary effort is normal.     Breath sounds: Normal breath sounds. No wheezing, rhonchi or rales.  Abdominal:     General: Abdomen is flat. Bowel sounds are normal. There is no distension.     Palpations: Abdomen is soft.     Tenderness: There is no abdominal tenderness.  Musculoskeletal:     Right lower leg: No edema.     Left lower leg: No edema.     Comments: ROM is abnormal in RUE, where arm is contracted. Bilateral LEs can be passively moved through full ROM. No edema, no injuries, no tenderness.  Skin:    General: Skin is warm and dry.  Neurological:     Mental Status: Edward Alvarado is alert and oriented to person, place, and time. Mental status is at baseline.     Cranial Nerves: No cranial nerve deficit.     Sensory: No sensory deficit.     Motor: Weakness present.     Deep Tendon Reflexes: Reflexes abnormal.     Comments: Motor: Tone is normal in LUE, increased in RUE, RLE, and LLE. Bulk is diminished globally, except for LUE. 1/5 strength present in RUE, RLE. 2/5 strength present in LLE. 5/5 strength present in LUE. Sensory: Sensation is symmetric to light touch and temperature in the arms and legs. Deep  Tendon Reflexes: LUE biceps is normal, RUE biceps is brisk, and patellar is absent bilaterally.   Psychiatric:        Mood and Affect: Mood normal.        Behavior: Behavior normal.    ASSESSMENT/PLAN:  Edward Alvarado is a 39 yo man who presents today for a mobility evaluation.  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  Nonambulatory.  Motorized wheelchair is his only means of transportation and a cane, walker, nonmotorized wheelchair will not suffice as patient has paralysis in 3 limbs. Edward Alvarado can use a PMD safely in his home as Edward Alvarado has been doing so for many years; patient is cognitively intact.  Request for new motorized wheelchair is appropriate based on patient's  diagnoses and my exam. - referral to neurological rehabilitation and PT for mobility evaluation  Hyperlipidemia Patient has been doing Mediterranean diet for several months.  We will recheck lipid panel in 3 months.  Appointment scheduled and future lab value for lipid panel and hemoglobin A1c entered   [name], MD [facility]

## 2020-04-27 ENCOUNTER — Encounter: Payer: Self-pay | Admitting: Family Medicine

## 2020-04-28 ENCOUNTER — Encounter: Payer: Self-pay | Admitting: Family Medicine

## 2020-04-29 ENCOUNTER — Other Ambulatory Visit: Payer: Self-pay | Admitting: Family Medicine

## 2020-04-29 ENCOUNTER — Telehealth: Payer: Self-pay | Admitting: *Deleted

## 2020-04-29 DIAGNOSIS — G800 Spastic quadriplegic cerebral palsy: Secondary | ICD-10-CM

## 2020-04-29 NOTE — Telephone Encounter (Signed)
[facility] calls and needs a prescription for PT/OT for seat evaluation.   They have received the medical records sent by Dr. Leary Roca.  Dr. Manson Passey put in order but it will need to be entered and signed by Dr. Deirdre Priest as he was the preceptor the day of the med records (attestation by a [employer] certified provider).  Spoke with Dr. Deirdre Priest.  He will sign and I can fax back to Sarah @ [phone].  Jone Baseman, CMA

## 2020-05-01 NOTE — Telephone Encounter (Signed)
Patients father LVM on nurse line checking status of this. Has this been faxed yet?

## 2020-05-01 NOTE — Telephone Encounter (Signed)
Yes it has.  Jone Baseman, CMA

## 2020-05-05 DIAGNOSIS — H401332 Pigmentary glaucoma, bilateral, moderate stage: Secondary | ICD-10-CM | POA: Diagnosis not present

## 2020-06-08 DIAGNOSIS — Z7409 Other reduced mobility: Secondary | ICD-10-CM | POA: Diagnosis not present

## 2020-06-08 DIAGNOSIS — Z7389 Other problems related to life management difficulty: Secondary | ICD-10-CM | POA: Diagnosis not present

## 2020-06-08 DIAGNOSIS — G800 Spastic quadriplegic cerebral palsy: Secondary | ICD-10-CM | POA: Diagnosis not present

## 2020-06-15 ENCOUNTER — Telehealth: Payer: Self-pay | Admitting: Family Medicine

## 2020-06-15 ENCOUNTER — Encounter: Payer: Self-pay | Admitting: Family Medicine

## 2020-06-15 NOTE — Telephone Encounter (Signed)
Paper work for new wheel chair form dropped off for at front desk for completion.  Verified that patient section of form has been completed.  Last DOS/WCC with PCP was06/04/21  Placed form in team folder to be completed by clinical staff.  Edward Alvarado

## 2020-06-15 NOTE — Telephone Encounter (Signed)
Clinical info completed on wheelchair form.  Place form in Dr. Thomes Lolling box for completion.  Edward Alvarado, CMA

## 2020-06-17 ENCOUNTER — Telehealth: Payer: Self-pay | Admitting: Family Medicine

## 2020-06-17 ENCOUNTER — Encounter: Payer: Self-pay | Admitting: Family Medicine

## 2020-06-17 NOTE — Telephone Encounter (Signed)
Forms completed and placed at the front for pick up tomorrow. Family notified by MyChart. Please ensure all patients know that there are 7 business days for any forms to be signed to help manage expectations.  [name], MD [facility], PGY-2

## 2020-06-17 NOTE — Telephone Encounter (Signed)
Patients father is calling to check on the status of the forms, please contact patients father as soon as possible or when completed. I checked Dr. Leary Roca and Dr. Deirdre Priest boxes and it wasn't in either. Thanks Fathers number [phone]

## 2020-06-30 ENCOUNTER — Other Ambulatory Visit (INDEPENDENT_AMBULATORY_CARE_PROVIDER_SITE_OTHER): Payer: Medicare Other | Admitting: Family Medicine

## 2020-06-30 DIAGNOSIS — E782 Mixed hyperlipidemia: Secondary | ICD-10-CM

## 2020-06-30 DIAGNOSIS — Z8639 Personal history of other endocrine, nutritional and metabolic disease: Secondary | ICD-10-CM

## 2020-06-30 DIAGNOSIS — Z131 Encounter for screening for diabetes mellitus: Secondary | ICD-10-CM

## 2020-07-01 ENCOUNTER — Encounter: Payer: Self-pay | Admitting: Family Medicine

## 2020-07-13 ENCOUNTER — Other Ambulatory Visit: Payer: Medicare Other

## 2020-07-13 ENCOUNTER — Other Ambulatory Visit: Payer: Self-pay

## 2020-07-13 DIAGNOSIS — E782 Mixed hyperlipidemia: Secondary | ICD-10-CM | POA: Diagnosis not present

## 2020-07-13 LAB — POCT GLYCOSYLATED HEMOGLOBIN (HGB A1C): Hemoglobin A1C: 5.1 % (ref 4.0–5.6)

## 2020-07-14 DIAGNOSIS — H401332 Pigmentary glaucoma, bilateral, moderate stage: Secondary | ICD-10-CM | POA: Diagnosis not present

## 2020-07-14 LAB — LIPID PANEL
Chol/HDL Ratio: 5.9 ratio — ABNORMAL HIGH (ref 0.0–5.0)
Cholesterol, Total: 213 mg/dL — ABNORMAL HIGH (ref 100–199)
HDL: 36 mg/dL — ABNORMAL LOW (ref >39)
LDL Chol Calc (NIH): 149 mg/dL — ABNORMAL HIGH (ref 0–99)
Triglycerides: 153 mg/dL — ABNORMAL HIGH (ref 0–149)
VLDL Cholesterol Cal: 28 mg/dL (ref 5–40)

## 2020-07-28 ENCOUNTER — Other Ambulatory Visit: Payer: Self-pay

## 2020-07-28 ENCOUNTER — Encounter: Payer: Self-pay | Admitting: Family Medicine

## 2020-07-28 ENCOUNTER — Ambulatory Visit (INDEPENDENT_AMBULATORY_CARE_PROVIDER_SITE_OTHER): Payer: Medicare Other | Admitting: Family Medicine

## 2020-07-28 VITALS — BP 124/82 | HR 80 | Wt 157.8 lb

## 2020-07-28 DIAGNOSIS — H6123 Impacted cerumen, bilateral: Secondary | ICD-10-CM

## 2020-07-28 DIAGNOSIS — Z23 Encounter for immunization: Secondary | ICD-10-CM | POA: Diagnosis not present

## 2020-07-28 DIAGNOSIS — Z0000 Encounter for general adult medical examination without abnormal findings: Secondary | ICD-10-CM | POA: Diagnosis not present

## 2020-07-28 DIAGNOSIS — R195 Other fecal abnormalities: Secondary | ICD-10-CM | POA: Insufficient documentation

## 2020-07-28 DIAGNOSIS — R197 Diarrhea, unspecified: Secondary | ICD-10-CM | POA: Diagnosis not present

## 2020-07-28 DIAGNOSIS — E785 Hyperlipidemia, unspecified: Secondary | ICD-10-CM

## 2020-07-28 MED ORDER — METAMUCIL PREMIUM BLEND 52.63 % PO POWD: 1.0000 | Freq: Every day | ORAL | 3 refills | Status: DC

## 2020-07-28 NOTE — Assessment & Plan Note (Signed)
Cerumen impaction no longer present. TMs normal. Continue intermittent debrox use for cerumen build up.

## 2020-07-28 NOTE — Assessment & Plan Note (Signed)
Patient has chronic h/o watery stools, see above. Because the problem is chronic, no pain, no signs or sx of infection, recommend to try a bulking agent such as metamucil. Can titrate starting with 1 tsp in 4 oz of water and increase to achieve 1 BM per day.

## 2020-07-28 NOTE — Assessment & Plan Note (Signed)
Total cholesterol 213 HDL 36 LDL 153 Triglycerides 149 Overall improved with the mediterranean diet. Recommend to continue. ASCVD risk is <2%, statin use not indicated. Diet is best way to mitigate risks of HLD at this point in time. There are medications to use to drive down numbers, but patient will not receive benefit other than improved lab value at this point.

## 2020-07-28 NOTE — Progress Notes (Signed)
SUBJECTIVE:   CHIEF COMPLAINT / HPI: f/u  HLD: lipid panel shows improvement in all markers. Recommend continuing mediterranean diet. Father concerned for triglyceride level. Discussed that main way of intervening on the number alone would be medication, and this would not improve patient's quality of life or risk factors.  Third COVID-19 shot: patient does not have immunodeficiency and does not qualify for COVID-19 booster. However, recommend they follow news as it is expected updates from the FDA are expected on boosters for the general population.  Loose stools: patient has loose, watery stools that present with urgency on a daily basis. This is a chronic problem. Patient does not have incontinence with these episodes, no pain or cramping. He is interested in finding a way to help this problem.  Ear wax: patient has used debrox several times at the group home. Mother would like his ears checked to see if the wax has improved.  Wheelchair update: it has been approved, expecting new power wheelchair within a few months.  In-person ASL interpreter present for entirety of visit  PERTINENT  PMH / PSH: CP with spastic quadriplegia, Deaf, HLD  OBJECTIVE:   BP 124/82   Pulse 80   Wt 157 lb 12.8 oz (71.6 kg)   SpO2 97%   BMI 23.30 kg/m   Physical Exam Vitals and nursing note reviewed.  Constitutional:      General: He is not in acute distress.    Appearance: Normal appearance. He is normal weight. He is not ill-appearing, toxic-appearing or diaphoretic.  HENT:     Head: Normocephalic and atraumatic.     Right Ear: Tympanic membrane, ear canal and external ear normal.     Left Ear: Tympanic membrane, ear canal and external ear normal.     Ears:     Comments: Mild wax in both ears, not impacted as last time    Nose: Nose normal.  Eyes:     Conjunctiva/sclera: Conjunctivae normal.  Cardiovascular:     Rate and Rhythm: Normal rate and regular rhythm.     Pulses: Normal pulses.       Heart sounds: Normal heart sounds. No murmur heard.  No friction rub. No gallop.   Pulmonary:     Effort: Pulmonary effort is normal. No respiratory distress.     Breath sounds: Normal breath sounds. No stridor. No wheezing, rhonchi or rales.  Abdominal:     General: Abdomen is flat.     Palpations: Abdomen is soft.  Musculoskeletal:     Comments: Wheel chair bound  Skin:    General: Skin is warm and dry.  Neurological:     General: No focal deficit present.     Mental Status: He is alert. Mental status is at baseline.  Psychiatric:        Mood and Affect: Mood normal.        Behavior: Behavior normal.    ASSESSMENT/PLAN:   Hyperlipidemia Total cholesterol 213 HDL 36 LDL 153 Triglycerides 149 Overall improved with the mediterranean diet. Recommend to continue. ASCVD risk is <2%, statin use not indicated. Diet is best way to mitigate risks of HLD at this point in time. There are medications to use to drive down numbers, but patient will not receive benefit other than improved lab value at this point.  Cerumen impaction Cerumen impaction no longer present. TMs normal. Continue intermittent debrox use for cerumen build up.  Watery stools Patient has chronic h/o watery stools, see above. Because the problem is  chronic, no pain, no signs or sx of infection, recommend to try a bulking agent such as metamucil. Can titrate starting with 1 tsp in 4 oz of water and increase to achieve 1 BM per day.   Healthcare maintenance Recommend patient obtain booster shot for COVID-19 if recommended by FDA. Expect to learn more information about recommendations within the next month. Recommend parents call if this news is released. For now he does not have immunodeficiency and does not qualify for a booster shot at this point in time.     [name], MD [facility]

## 2020-07-28 NOTE — Assessment & Plan Note (Addendum)
Recommend patient obtain booster shot for COVID-19 if recommended by FDA. Expect to learn more information about recommendations within the next month. Recommend parents call if this news is released. For now he does not have immunodeficiency and does not qualify for a booster shot at this point in time.  Flu vaccine given today.

## 2020-07-28 NOTE — Patient Instructions (Addendum)
It was a pleasure to see you today!  1. Your mediterranean diet is going well! Keep up the good work. I can see you back for your regular annual visit in January.  2. Keep up the good work with debrox to reduce ear wax.  3. For the third booster for COVID-19, I recommend looking for updates from the FDA regarding regular boosters. You do not have an immunodeficiency that qualifies for the booster at this time. I expect more information on boosters to be released within the next month.  4. For loose stools a bulking agent like metamucil can help prevent multiple loose and watery stools. You can titrate metamucil to one bowel movement per day by increasing in tsp per water. If after you reach 3 tsp in 12 oz of water (ratio of 1 tsp per 4 oz), and still having loose stools or increased loose stools: stop and please let me know.  Be Well!  Dr. Leary Roca

## 2020-08-12 ENCOUNTER — Other Ambulatory Visit: Payer: Self-pay | Admitting: Family Medicine

## 2020-08-20 DIAGNOSIS — G800 Spastic quadriplegic cerebral palsy: Secondary | ICD-10-CM | POA: Diagnosis not present

## 2020-08-27 DIAGNOSIS — Z23 Encounter for immunization: Secondary | ICD-10-CM | POA: Diagnosis not present

## 2020-08-28 ENCOUNTER — Encounter: Payer: Self-pay | Admitting: Family Medicine

## 2020-09-01 ENCOUNTER — Telehealth: Payer: Self-pay

## 2020-09-01 NOTE — Telephone Encounter (Signed)
Received phone call from [place] at [facility] and PT regarding plan of care documents. [place] states this was received by our office on Friday, 10/15. Checked providers box for form and was unable to locate.   Once form is completed, please fax to [phone], attention [place].   *If you do not have the form, please let me know and I will reach back out to office.   To PCP  Veronda Prude, RN

## 2020-09-01 NOTE — Telephone Encounter (Signed)
Form requires signature from attending, Dr. Deirdre Priest, as well. I signed and placed in his box yesterday for signature.  [name], MD [facility], PGY-2

## 2020-09-03 NOTE — Telephone Encounter (Signed)
All forms in box signed and put in to be faxed

## 2020-09-15 DIAGNOSIS — H401332 Pigmentary glaucoma, bilateral, moderate stage: Secondary | ICD-10-CM | POA: Diagnosis not present

## 2020-11-22 DIAGNOSIS — Z1152 Encounter for screening for COVID-19: Secondary | ICD-10-CM | POA: Diagnosis not present

## 2020-12-06 ENCOUNTER — Encounter: Payer: Self-pay | Admitting: Family Medicine

## 2020-12-15 ENCOUNTER — Other Ambulatory Visit: Payer: Self-pay

## 2020-12-15 ENCOUNTER — Telehealth: Payer: Medicare Other | Admitting: Family Medicine

## 2020-12-29 ENCOUNTER — Other Ambulatory Visit: Payer: Self-pay

## 2020-12-29 ENCOUNTER — Ambulatory Visit (INDEPENDENT_AMBULATORY_CARE_PROVIDER_SITE_OTHER): Payer: Medicare Other | Admitting: Family Medicine

## 2020-12-29 ENCOUNTER — Encounter: Payer: Self-pay | Admitting: Family Medicine

## 2020-12-29 ENCOUNTER — Ambulatory Visit (HOSPITAL_COMMUNITY)
Admission: RE | Admit: 2020-12-29 | Discharge: 2020-12-29 | Disposition: A | Discharge status: Home / Self Care | Payer: Medicare Other | Source: Ambulatory Visit | Attending: Family Medicine | Admitting: Family Medicine

## 2020-12-29 VITALS — BP 120/78 | HR 79 | Ht 69.0 in | Wt 162.0 lb

## 2020-12-29 DIAGNOSIS — Z0000 Encounter for general adult medical examination without abnormal findings: Secondary | ICD-10-CM

## 2020-12-29 DIAGNOSIS — Z1159 Encounter for screening for other viral diseases: Secondary | ICD-10-CM

## 2020-12-29 DIAGNOSIS — R002 Palpitations: Secondary | ICD-10-CM | POA: Diagnosis not present

## 2020-12-29 DIAGNOSIS — M4153 Other secondary scoliosis, cervicothoracic region: Secondary | ICD-10-CM | POA: Diagnosis not present

## 2020-12-29 DIAGNOSIS — I517 Cardiomegaly: Secondary | ICD-10-CM | POA: Diagnosis not present

## 2020-12-29 DIAGNOSIS — E785 Hyperlipidemia, unspecified: Secondary | ICD-10-CM | POA: Diagnosis not present

## 2020-12-29 NOTE — Assessment & Plan Note (Signed)
Will check direct LDL today. If >180 can consider statin, but improved at last visit to 149. Patient on mediterranean diet, but very picky about vegetables. Counseled on diet.

## 2020-12-29 NOTE — Patient Instructions (Addendum)
It was a pleasure to see you today!  1. We will get some labs today.  If they are abnormal or we need to do something about them, I will call you.  If they are normal, I will send you a message on MyChart (if it is active) or a letter in the mail.  If you don't hear from [place] in 2 weeks, please call the office  [phone].  2. For chest xray: please go to [facility] on [address]. You can go between the hours of 8 AM and 5 PM and do not need an appointment.  3. For diet: we will do the best we can and continue to monitor weight and cholesterol. If we need to start a diary if his BMI goes above 25, we will, but at this time we do not need to. Lean proteins, multiple vegetables, and cooking with EVOO is the way to go and you all are doing a great job. Follow up in 6 months.  Be Well,  Dr. Leary Roca

## 2020-12-29 NOTE — Assessment & Plan Note (Signed)
History of enlarged cardiac silhouette, previously has seen cardiology, but not in many years. Will evaluate with CXR. EKG NSR. Most likely this episode of coughing and "heart stopping" was probably an aspiration event. Exam WNL.

## 2020-12-29 NOTE — Assessment & Plan Note (Signed)
Will obtain radiographs to evaluate scoliosis. Based on angles can refer to ortho and/or PM&R.

## 2020-12-29 NOTE — Progress Notes (Signed)
    SUBJECTIVE:   CHIEF COMPLAINT / HPI: f/u HLD  Coughing episode, concern for cardiac problem, scoliosis: Last Wednesday patient was out eating dinner and playing games with family. He had about a 1 minute episode of coughing where he turned purple. When asked about it later, he stated that "[his] heart stopped beating." Patient is deaf and uses ASL, mother reports that she does not think he can would be able to articulate correctly the sensation of  Heart stopping, but patient rarely signs in anything more than 1 word, so it was noteworthy to the family that he had that much input. His mother notes that he had a dorsal rhizotomy in 1990, enlarged heart therafter seen on CXR in 1995-2000. He has previously seen cardiologist who said that he had enlarged heart, but not out of spectrum of normal. He also has scoliosis as a result rhizotomy and has not been followed up since he last saw a pediatric orthopedist at [facility] sometime b/w 1995-2000. His parents wonder if this could contribute to heart enlargement or other cardiac issues.  Weight: his mother is concerned that patient waist line has enlarged- he cannot fit into same sized pants. She is concerned about possible obesity and health implications as well as hyperlipidemia. Weight is up by 5lbs since September, BMI 23.9.  HLD: patient has elevated but improved lipids at last visit in September. He has gained 5 lbs, so parents are concerned about lipids. Last January patient had elevated LDL to 188, which improved to 149 with addition of mediterranean diet. Will check direct LDL today.  PERTINENT  PMH / PSH: cerebral palsy, spastic quadriplegia, conductive hearing loss b/l, HLD  OBJECTIVE:   BP 120/78   Pulse 79   Ht 5\' 9"  (1.753 m)   Wt 162 lb (73.5 kg)   SpO2 99%   BMI 23.92 kg/m   Nursing note and vitals reviewed GEN: age-appropriate, WM, resting comfortably in chair, NAD, WNWD HEENT: NCAT. Sclera without injection or icterus.   Cardiac: Regular rate and rhythm. Normal S1/S2. No murmurs, rubs, or gallops appreciated. 2+ radial pulses. Lungs: Clear bilaterally to ascultation. No increased WOB, no accessory muscle usage. No w/r/r. Neuro: Alert and at baseline Ext: no edema Psych: Pleasant and appropriate, ASL interpreter  ASSESSMENT/PLAN:   Hyperlipidemia Will check direct LDL today. If >180 can consider statin, but improved at last visit to 149. Patient on mediterranean diet, but very picky about vegetables. Counseled on diet.  SCOLIOSIS Will obtain radiographs to evaluate scoliosis. Based on angles can refer to ortho and/or PM&R.  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 Will evaluate with CXR. EKG NSR. Most likely this episode of coughing and "heart stopping" was probably an aspiration event. Exam WNL.  Healthcare maintenance Will check hep c, low risk, routine screening.  Weight is still appropriate. Patient has put on 5 lbs, but BMI is still WNL. Counseled on diet.     , MD [facility]

## 2020-12-29 NOTE — Assessment & Plan Note (Addendum)
Will check hep c, low risk, routine screening.  Weight is still appropriate. Patient has put on 5 lbs, but BMI is still WNL. Counseled on diet.

## 2020-12-30 LAB — LDL CHOLESTEROL, DIRECT: LDL Direct: 157 mg/dL — ABNORMAL HIGH (ref 0–99)

## 2020-12-30 LAB — HEPATITIS C ANTIBODY: Hep C Virus Ab: <0.1 s/co ratio (ref 0.0–0.9)

## 2021-01-03 ENCOUNTER — Encounter: Payer: Self-pay | Admitting: Family Medicine

## 2021-01-08 ENCOUNTER — Telehealth: Payer: Self-pay

## 2021-01-08 NOTE — Telephone Encounter (Signed)
Father calls nurse line in regards to patient having Covid recently and returning to his group home. Patient tested positive on 02/20, patient has no no symptoms in 24 hours. Father denies fever, body aches, or congestion. The parents are hoping to take him back on Sunday 2/27, however would like PCP opinion. Please call the father at [phone].

## 2021-01-10 ENCOUNTER — Encounter: Payer: Self-pay | Admitting: Family Medicine

## 2021-01-10 NOTE — Telephone Encounter (Signed)
Sent MyChart message to family that because Edward Alvarado was fully vaccinated and boosted, per CDC guidelines he only needed to quarantine for 5 days. Since he tested positive and had sx on 01/03/21, as long as he doesn't have a fever in the last 24 hours and his symptoms are overall improving, he can go back to group home today, 01/10/21.  [name], MD [facility] Res[facility], PGY-2

## 2021-01-13 ENCOUNTER — Telehealth: Payer: Self-pay

## 2021-01-13 ENCOUNTER — Encounter: Payer: Self-pay | Admitting: Family Medicine

## 2021-01-13 DIAGNOSIS — R059 Cough, unspecified: Secondary | ICD-10-CM

## 2021-01-13 MED ORDER — DEXTROMETHORPHAN POLISTIREX ER 30 MG/5ML PO SUER: 30.0000 mg | Freq: Two times a day (BID) | ORAL | 0 refills | Status: DC

## 2021-01-13 MED ORDER — BENZONATATE 100 MG PO CAPS: 100.0000 mg | ORAL_CAPSULE | Freq: Two times a day (BID) | ORAL | 0 refills | Status: DC | PRN

## 2021-01-13 NOTE — Telephone Encounter (Signed)
Patients father calls back to nurse line checking status of cough medicine orders. Father reports he is back at his group home, however has not been eating that much due to the constant cough. Father reports the patient told his group home he was afraid of choking. If something for cough could be called in that would be easier. If parents were to pick up something over the counter the group home would still need a written order from [place] to administer. Will forward to PCP.

## 2021-01-13 NOTE — Telephone Encounter (Signed)
Patient's father calls nurse line regarding persistent cough since COVID. Patient was diagnosed with COVID on 2/20. Patient continues to have a cough, mostly during the day. Father states that he has been taking Singulair and nasal spray. Requesting further recommendations for cough. Due to patient living in facility, patient is unable to have OTC medications without provider order.   Please advise.   Veronda Prude, RN

## 2021-01-13 NOTE — Telephone Encounter (Signed)
Patient's father calls reporting consistent coughing after COVID infection. Patient lives in a group home and needs orders for OTC meds. Recommend tessalon perles BID prn and parents use delsym, so will call in generic delsym 15 mL BID prn cough. Educated that the cough from a viral illness is usually the last sx to resolve and can be present for 3-8 weeks. Parents also report that patient has had one episode of diarrhea for the last three days. Suspect from resolving viral illness. Recommend to continue monitoring and offering plenty of fluids, if no improvement or episodes of diarrhea worsens, schedule follow up. Patient is otherwise doing very well.  [name], MD [facility] Res[facility], PGY-2

## 2021-01-14 ENCOUNTER — Ambulatory Visit
Admission: RE | Admit: 2021-01-14 | Discharge: 2021-01-14 | Disposition: A | Discharge status: Home / Self Care | Payer: Medicare Other | Source: Ambulatory Visit | Attending: Family Medicine | Admitting: Family Medicine

## 2021-01-14 ENCOUNTER — Other Ambulatory Visit: Payer: Self-pay | Admitting: Family Medicine

## 2021-01-14 DIAGNOSIS — M4153 Other secondary scoliosis, cervicothoracic region: Secondary | ICD-10-CM

## 2021-01-14 DIAGNOSIS — R059 Cough, unspecified: Secondary | ICD-10-CM | POA: Diagnosis not present

## 2021-01-14 DIAGNOSIS — M4184 Other forms of scoliosis, thoracic region: Secondary | ICD-10-CM | POA: Diagnosis not present

## 2021-01-14 DIAGNOSIS — I517 Cardiomegaly: Secondary | ICD-10-CM

## 2021-01-26 DIAGNOSIS — H401332 Pigmentary glaucoma, bilateral, moderate stage: Secondary | ICD-10-CM | POA: Diagnosis not present

## 2021-02-03 ENCOUNTER — Emergency Department (HOSPITAL_COMMUNITY): Payer: Medicare Other

## 2021-02-03 ENCOUNTER — Other Ambulatory Visit: Payer: Self-pay

## 2021-02-03 ENCOUNTER — Emergency Department (HOSPITAL_COMMUNITY)
Admission: EM | Admit: 2021-02-03 | Discharge: 2021-02-03 | Disposition: A | Discharge status: Home / Self Care | Payer: Medicare Other | Attending: Emergency Medicine | Admitting: Emergency Medicine

## 2021-02-03 DIAGNOSIS — Z79899 Other long term (current) drug therapy: Secondary | ICD-10-CM | POA: Diagnosis not present

## 2021-02-03 DIAGNOSIS — R4182 Altered mental status, unspecified: Secondary | ICD-10-CM | POA: Diagnosis not present

## 2021-02-03 DIAGNOSIS — R55 Syncope and collapse: Secondary | ICD-10-CM | POA: Diagnosis not present

## 2021-02-03 DIAGNOSIS — R569 Unspecified convulsions: Secondary | ICD-10-CM | POA: Diagnosis not present

## 2021-02-03 LAB — BASIC METABOLIC PANEL
Anion gap: 6 (ref 5–15)
BUN: 17 mg/dL (ref 6–20)
CO2: 25 mmol/L (ref 22–32)
Calcium: 9.1 mg/dL (ref 8.9–10.3)
Chloride: 104 mmol/L (ref 98–111)
Creatinine, Ser: 0.93 mg/dL (ref 0.61–1.24)
GFR, Estimated: >60 mL/min (ref >60)
Glucose, Bld: 106 mg/dL — ABNORMAL HIGH (ref 70–99)
Potassium: 4.4 mmol/L (ref 3.5–5.1)
Sodium: 135 mmol/L (ref 135–145)

## 2021-02-03 LAB — URINALYSIS, ROUTINE W REFLEX MICROSCOPIC
Bilirubin Urine: NEGATIVE
Glucose, UA: NEGATIVE mg/dL
Hgb urine dipstick: NEGATIVE
Ketones, ur: NEGATIVE mg/dL
Leukocytes,Ua: NEGATIVE
Nitrite: NEGATIVE
Protein, ur: NEGATIVE mg/dL
Specific Gravity, Urine: 1.02 (ref 1.005–1.030)
pH: 6 (ref 5.0–8.0)

## 2021-02-03 LAB — CBG MONITORING, ED: Glucose-Capillary: 103 mg/dL — ABNORMAL HIGH (ref 70–99)

## 2021-02-03 LAB — CBC WITH DIFFERENTIAL/PLATELET
Abs Immature Granulocytes: 0.06 10*3/uL (ref 0.00–0.07)
Basophils Absolute: 0 10*3/uL (ref 0.0–0.1)
Basophils Relative: 0 %
Eosinophils Absolute: 0.5 10*3/uL (ref 0.0–0.5)
Eosinophils Relative: 4 %
HCT: 45 % (ref 39.0–52.0)
Hemoglobin: 15.8 g/dL (ref 13.0–17.0)
Immature Granulocytes: 1 %
Lymphocytes Relative: 17 %
Lymphs Abs: 2.2 10*3/uL (ref 0.7–4.0)
MCH: 31.3 pg (ref 26.0–34.0)
MCHC: 35.1 g/dL (ref 30.0–36.0)
MCV: 89.3 fL (ref 80.0–100.0)
Monocytes Absolute: 0.9 10*3/uL (ref 0.1–1.0)
Monocytes Relative: 7 %
Neutro Abs: 9.6 10*3/uL — ABNORMAL HIGH (ref 1.7–7.7)
Neutrophils Relative %: 71 %
Platelets: 133 10*3/uL — ABNORMAL LOW (ref 150–400)
RBC: 5.04 MIL/uL (ref 4.22–5.81)
RDW: 12.6 % (ref 11.5–15.5)
WBC: 13.3 10*3/uL — ABNORMAL HIGH (ref 4.0–10.5)
nRBC: 0 % (ref 0.0–0.2)

## 2021-02-03 LAB — RAPID URINE DRUG SCREEN, HOSP PERFORMED
Amphetamines: NOT DETECTED
Barbiturates: NOT DETECTED
Benzodiazepines: NOT DETECTED
Cocaine: NOT DETECTED
Opiates: NOT DETECTED
Tetrahydrocannabinol: NOT DETECTED

## 2021-02-03 NOTE — Discharge Instructions (Addendum)
Please follow-up with your primary doctor.  Discuss further testing such as heart monitor or if they feel referral to neurology, EEG is necessary.  If he has any episodes of passing out, any difficulty breathing, chest pain or other new concerning symptom, return to ER for reassessment.

## 2021-02-03 NOTE — ED Triage Notes (Signed)
Pt arrived via [facility] for cc of syncope from group home. Pt is deaf, non verbal, mother is at bedside for interpretation. Care taker reports that she went to administer eye drops to patient when an episode of unconsciousness occurred. No shaking, emesis, or loss of bowel control noted. Pt alert and oriented to baseline shortly after episode.

## 2021-02-03 NOTE — ED Provider Notes (Signed)
[facility] EMERGENCY DEPARTMENT Provider Note   CSN: [id number] Arrival date & time: 02/03/21  2045     History Chief Complaint  Patient presents with  . Syncope    Edward Alvarado is a 40 y.o. male.  Presents to ER from group home via EMS.  Deaf, nonverbal, mother at bedside recommendation.  Patient was in his normal state of health in his wheelchair when he passed out.  Reports that he thinks he completely lost consciousness.  Witnesses say he was briefly out and then came to.  No vomiting, bladder or bowel incontinence, no shaking.  No prolonged postictal state.  Patient has no acute complaints at present.  Specifically denies any chest pain, difficulty breathing, abdominal pain or other new symptoms.  Per review of chart, a few weeks ago, patient had syncopal episodes associated with coughing spell.  Primary doctor felt possibly aspiration event.  HPI     Past Medical History:  Diagnosis Date  . Allergic rhinitis   . Allergy   . Atrophy, cortical    Lower left ventrical  . Cerebral palsy (HCC)    W/C BOUND-ABLE TO TRANSFER W/C TO BED -DOES REQUIRE SOME HELP IN BATHROOM WITH CLOTHES--LIVES IN GROUP HOME CALLED [name]-HIS PARENTS ARE HIS LEGAL GUARDIANS  . Complication of anesthesia    JAN 2012 EYE SURGERY AT [facility] WAS USED AND PT EXPERIENCED AIRWAY / OXYGENATION PROBLEMS.  PT HAD SUBSEQUENT SURGERIES  AFTER JAN 2012 AT [facility] AND PARENTS TOLD ET USED AND PT DID FINE.  PT HAD NO ANESTHESIA PROBLEMS WITH SURGERIES PRIOR TO THE JAN 2012 SURGERY.  . Deafness    hearing aids-NOT WEARING AT PRESENT TIME; ABLE TO DO SIGN LANGUAGE WITH HIS PARENTS  . Glaucoma   . Hyperlipidemia   . Kidney stone   . Platelets decreased (HCC)   . Prepatellar bursitis    Hospitalized in 2012 for sepsis secondary to bursisiitis  . Scoliosis     Patient Active Problem List   Diagnosis Date Noted  . Enlarged heart 12/29/2020  . Watery stools 07/28/2020  . Healthcare  maintenance 07/28/2020  . History of malnutrition 12/19/2019  . Encounter for power mobility device assessment 05/24/2017  . Wheelchair bound 05/24/2017  . Vitamin D insufficiency 05/29/2015  . Cerumen impaction 09/02/2013  . Hyperlipidemia 07/18/2007  . THROMBOCYTOPENIA NOS 06/26/2007  . Cerebral palsy (HCC) 06/26/2007  . 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 Allergic rhinitis 06/26/2007  . SCOLIOSIS 06/26/2007    Past Surgical History:  Procedure Laterality Date  . ADENOIDECTOMY    . CYSTOSCOPY/RETROGRADE/URETEROSCOPY  10/01/2012   Procedure: CYSTOSCOPY/RETROGRADE/URETEROSCOPY;  Surgeon: Crecencio Mc, MD;  Location: [facility];  Service: Urology;  Laterality: Left;  laser lithotripsy stone extraction on left  . eye surgery  2012   [facility]  . EYE SURGERY     4 RIGHT EYE SURGERIES FOR GLAUCOMA AND ONE  RT EYE CATARACT EXTRACTION; LEFT EYE CATARACT EXTRACTION WITH  I STENTPROCEDURE FOR GLAUCOMA  . HOLMIUM LASER APPLICATION  10/01/2012   Procedure: HOLMIUM LASER APPLICATION;  Surgeon: Crecencio Mc, MD;  Location: [facility];  Service: Urology;  Laterality: Left;  . multiple orthopedic procedures    . MYRINGOTOMY    . SELECTIVE RHIZOTOMY PROCEDURE TO HELP REDUCE SPASTICITY AND CONTRACTURES         Family History  Problem Relation Age of Onset  . Asthma Mother   . Kidney Stones Mother   .  Osteoporosis Mother   . Hypertension Mother   . Hyperlipidemia Mother   . Hyperlipidemia Father   . Hodgkin's lymphoma Paternal Uncle   . Alzheimer's disease Paternal Grandmother   . Stroke Paternal Grandmother   . Asthma Maternal Grandmother   . Stroke Paternal Grandfather     Social History   Tobacco Use  . Smoking status: Never Smoker  . Smokeless tobacco: Never Used  Substance Use Topics  . Alcohol use: No  . Drug use: No    Home Medications Prior to Admission medications   Medication Sig Start Date End Date Taking?  Authorizing Provider  benzonatate (TESSALON) 100 MG capsule Take 1 capsule (100 mg total) by mouth 2 (two) times daily as needed for cough. 01/13/21   [name], MD  brimonidine-timolol (COMBIGAN) 0.2-0.5 % ophthalmic solution Place 1 drop into both eyes every 12 (twelve) hours. 05/08/13   Andrena Mews, DO  carbamide peroxide (DEBROX) 6.5 % OTIC solution Place 5 drops into both ears once a week. 05/14/19   Caro Laroche, DO  dextromethorphan (DELSYM) 30 MG/5ML liquid Take 5 mLs (30 mg total) by mouth 2 (two) times daily. 01/13/21   [name], MD  fluticasone [facility]) 50 MCG/ACT nasal spray Place 2 sprays into both nostrils daily. 12/17/19   [name], MD  montelukast (SINGULAIR) 10 MG tablet TAKE 1 TABLET BY MOUTH AT BEDTIME. 03/04/19   Riccio, Marcell Anger, DO  multivitamin (ONE-A-DAY MEN'S) TABS tablet TAKE (1) TABLET BY MOUTH ONCE DAILY. 04/11/19   Tillman Sers, DO  ondansetron (ZOFRAN) 4 MG tablet Take 1 tablet (4 mg total) by mouth every 8 (eight) hours as needed for nausea or vomiting. 05/14/19   Caro Laroche, DO  Psyllium (METAMUCIL PREMIUM BLEND) 52.63 % POWD Take 1 Scoop by mouth daily. Take 1 tsp of metamucil in 4 oz of water daily to titrate to 1 bowel movement daily. If more than one BM daily, can increase to 2 tsp in 8 oz of water. If patient is sick, do not give. 07/28/20   [name], MD  Skin Protectants, Misc. (EUCERIN) cream Apply topically as needed (dry skin). Generic okay 05/14/19   Caro Laroche, DO  sodium chloride (OCEAN) 0.65 % SOLN nasal spray Place 1 spray into both nostrils as needed for congestion. As needed for allergy symptoms 12/05/17   Beaulah Dinning, MD  Vitamin D, Cholecalciferol, 25 MCG (1000 UT) TABS TAKE (1) TABLET BY MOUTH ONCE DAILY. 08/13/20   [name], MD    Allergies    Iodine, Afrin [oxymetazoline], and Betadine [povidone iodine]  Review of Systems   Review of Systems  Constitutional: Negative for chills and  fever.  HENT: Negative for ear pain and sore throat.   Eyes: Negative for pain and visual disturbance.  Respiratory: Negative for cough and shortness of breath.   Cardiovascular: Negative for chest pain and palpitations.  Gastrointestinal: Negative for abdominal pain and vomiting.  Genitourinary: Negative for dysuria and hematuria.  Musculoskeletal: Negative for arthralgias and back pain.  Skin: Negative for color change and rash.  Neurological: Negative for seizures and syncope.  All other systems reviewed and are negative.   Physical Exam Updated Vital Signs BP (!) 147/100   Pulse 85   Temp 97.9 F (36.6 C) (Oral)   Resp 15   SpO2 97%   Physical Exam Vitals and nursing note reviewed.  Constitutional:      Appearance: He is well-developed.  HENT:  Head: Normocephalic and atraumatic.  Eyes:     Conjunctiva/sclera: Conjunctivae normal.  Cardiovascular:     Rate and Rhythm: Normal rate and regular rhythm.     Heart sounds: No murmur heard.   Pulmonary:     Effort: Pulmonary effort is normal. No respiratory distress.     Breath sounds: Normal breath sounds.  Abdominal:     Palpations: Abdomen is soft.     Tenderness: There is no abdominal tenderness.  Musculoskeletal:        General: No signs of injury.     Cervical back: Neck supple.  Skin:    General: Skin is warm and dry.  Neurological:     General: No focal deficit present.     Mental Status: He is alert.     Comments: Alert, following commands     ED Results / Procedures / Treatments   Labs (all labs ordered are listed, but only abnormal results are displayed) Labs Reviewed  BASIC METABOLIC PANEL - Abnormal; Notable for the following components:      Result Value   Glucose, Bld 106 (*)    All other components within normal limits  CBC WITH DIFFERENTIAL/PLATELET - Abnormal; Notable for the following components:   WBC 13.3 (*)    Platelets 133 (*)    Neutro Abs 9.6 (*)    All other components within  normal limits  URINALYSIS, ROUTINE W REFLEX MICROSCOPIC - Abnormal; Notable for the following components:   APPearance HAZY (*)    All other components within normal limits  CBG MONITORING, ED - Abnormal; Notable for the following components:   Glucose-Capillary 103 (*)    All other components within normal limits  URINE CULTURE  RAPID URINE DRUG SCREEN, HOSP PERFORMED    EKG EKG Interpretation  Date/Time:  Wednesday February 03 2021 20:50:38 EDT Ventricular Rate:  81 PR Interval:    QRS Duration: 101 QT Interval:  359 QTC Calculation: 417 R Axis:   100 Text Interpretation: Sinus rhythm ST elev, probable normal early repol pattern no acute change when compared to prior Confirmed by Marianna Fuss ([id number]) on 02/03/2021 11:01:36 PM   Radiology DG Chest 1 View  Result Date: 02/03/2021 CLINICAL DATA:  Syncope or seizure EXAM: CHEST  1 VIEW COMPARISON:  None. FINDINGS: The heart size and mediastinal contours are within normal limits. Both lungs are clear. The visualized skeletal structures are unremarkable. IMPRESSION: No active disease. Electronically Signed   By: Jonna Clark M.D.   On: 02/03/2021 23:11   CT Head Wo Contrast  Result Date: 02/03/2021 CLINICAL DATA:  Seizure, syncope EXAM: CT HEAD WITHOUT CONTRAST TECHNIQUE: Contiguous axial images were obtained from the base of the skull through the vertex without intravenous contrast. COMPARISON:  None. FINDINGS: Brain: No acute intracranial abnormality. Specifically, no hemorrhage, hydrocephalus, mass lesion, acute infarction, or significant intracranial injury. Vascular: No hyperdense vessel or unexpected calcification. Skull: No acute calvarial abnormality. Sinuses/Orbits: No acute findings. Postoperative changes in the right orbit/globe. Other: None IMPRESSION: No acute intracranial abnormality. Electronically Signed   By: Charlett Nose M.D.   On: 02/03/2021 22:00    Procedures Procedures   Medications Ordered in ED Medications -  No data to display  ED Course  I have reviewed the triage vital signs and the nursing notes.  Pertinent labs & imaging results that were available during my care of the patient were reviewed by me and considered in my medical decision making (see chart for details).    MDM  Rules/Calculators/A&P                         40 year old male with history of cerebral palsy presents to ER after he had possible syncopal episode at group home today.  Basic labs were stable, noted mild leukocytosis.  He has no infectious symptoms and no fever.  EKG without acute ischemic change, normal intervals.  No events on telemetry monitoring.  No clear seizure-like activity but given the possibility, check CT head.  This was negative.  Patient has no symptoms at present and remains well-appearing throughout ER stay.  Given his work-up, believe he can be discharged and managed in the outpatient setting.  Recommended close follow-up with PCP, consideration for outpatient cardiac monitor.  Discharged home.   After the discussed management above, the patient was determined to be safe for discharge.  The patient was in agreement with this plan and all questions regarding their care were answered.  ED return precautions were discussed and the patient will return to the ED with any significant worsening of condition.   Final Clinical Impression(s) / ED Diagnoses Final diagnoses:  Syncope, unspecified syncope type    Rx / DC Orders ED Discharge Orders    None       Milagros Lollykstra, Richard S, MD 02/03/21 2327

## 2021-02-05 LAB — URINE CULTURE: Culture: <10000 — AB

## 2021-02-22 ENCOUNTER — Ambulatory Visit (INDEPENDENT_AMBULATORY_CARE_PROVIDER_SITE_OTHER): Payer: Medicare Other | Admitting: Family Medicine

## 2021-02-22 ENCOUNTER — Other Ambulatory Visit: Payer: Self-pay

## 2021-02-22 VITALS — BP 128/78 | HR 90 | Wt 156.8 lb

## 2021-02-22 DIAGNOSIS — R55 Syncope and collapse: Secondary | ICD-10-CM | POA: Insufficient documentation

## 2021-02-22 NOTE — Progress Notes (Signed)
    SUBJECTIVE:   CHIEF COMPLAINT / HPI:   Syncope: patient had episode of syncope and was seen in ED on 02/03/21. Patient was in his wheel chair, briefly lost consciousness, no noted seizure activity, no bowel or bladder incontinence. He had a  He had COVID in January which was mild in nature, recovered well. Labs, ekg, and CT head WNL at ED visit on 02/03/21. He had a similar episode in February while out at [employer] where he coughed a few times, then suddenly slumped, lost consciousness, but returned to baseline quickly (<1 min). At that time patient signed to his aide that his "heart stopped." His mother reports that she is not sure that is something within his understanding to describe, but is notable. He was seen at ED with no significant findings at that time either. Patient has CP with spastic quadriplegia, but no h/o of cardiac problems, no seizure hx. Of note, these episodes do not occur with transfers, less likely to be autonomic in nature. With two episodes of sudden onset loss of consciousness with little to no preceding symptoms, no bowel/bladder incontinence, no remarkable laboratory or imaging findings, needs work up for cardiogenic syncope.   PERTINENT  PMH / PSH: CP, spastic quadriplegia, wheel chair dependent.  OBJECTIVE:   BP 128/78   Pulse 90   Wt 156 lb 12.8 oz (71.1 kg)   SpO2 98%   BMI 23.16 kg/m   Nursing note and vitals reviewed GEN: age-appropriate, WM, resting comfortably in wheelchair, NAD, WNWD HEENT: NCAT. PERRLA. Sclera without injection or icterus. MMM.  Cardiac: Regular rate and rhythm. Normal S1/S2. No murmurs, rubs, or gallops appreciated. 2+ radial pulses. Lungs: Clear bilaterally to ascultation. No increased WOB, no accessory muscle usage. No w/r/r. Neuro: Cranial Nerves II: Visual Fields are full. PERRL.  III,IV, VI: EOMI without ptosis or diplopia.  V: Facial sensation is symmetric to touch VII: Facial movement is symmetric.  VIII: Hearing is not  intact to voice- patient is deaf X: Palate elevates symmetrically XI: Shoulder shrug is symmetric. XII: Tongue is midline without atrophy or fasciculations.  Motor: Tone is baseline- diminished b/l LEs, diminished RUE, normal in LUE. 2/5 strength (baseline) was present in b/l LEs, 3/5 strength in RUE, 5/5 strength in LUE- consistent w/ previous exams Sensory: Sensation is symmetric to light touch in the arms and legs. Ext: no edema Psych: Pleasant and appropriate  ASSESSMENT/PLAN:   Syncope and collapse Patient with 2 episodes of syncope with little to no warning, not associated with transitions. Most concerning for cardiogenic etiology due to suddenness. Will proceed with referral to cardiology for cardiac monitor. Also referral placed to neurology as this could be neurogenic.      [name], MD [facility]

## 2021-02-22 NOTE — Assessment & Plan Note (Signed)
Patient with 2 episodes of syncope with little to no warning, not associated with transitions. Most concerning for cardiogenic etiology due to suddenness. Will proceed with referral to cardiology for cardiac monitor. Also referral placed to neurology as this could be neurogenic.

## 2021-02-22 NOTE — Patient Instructions (Addendum)
It was a pleasure to see you today!  1. I will place a referral to see cardiology and neurology. We will plan to work up cardiology first as this is more concerning. You can expect to have a call in the next 1-2 weeks to set up this appt. If you do not receive a phone call in that time frame, let me know and I can help facilitate this appt.   2. Please schedule a f/u appt after you see cardiology, most likely in 8-10 weeks. Our June schedule is not out yet, but call back in May and you can schedule a follow up then.   Be Well,  Dr. Leary Roca

## 2021-02-24 ENCOUNTER — Encounter: Payer: Self-pay | Admitting: Neurology

## 2021-03-08 NOTE — Progress Notes (Signed)
Please disregard ,opened in error 

## 2021-03-22 ENCOUNTER — Telehealth: Payer: Self-pay

## 2021-03-22 NOTE — Telephone Encounter (Signed)
Clinical info completed on Physical form.  Place form in PCP's box for completion.  Young Brim, CMA  

## 2021-03-22 NOTE — Telephone Encounter (Signed)
Physical Exam form dropped off for at front desk for completion.  Verified that patient section of form has been completed.  Last DOS/WCC with PCP was 02/22/2021.  Placed form in team folder to be completed by clinical staff.  [employer]

## 2021-03-23 NOTE — Telephone Encounter (Signed)
Form completed and placed up front, please call family to pick up.  [name], MD [facility], PGY-2

## 2021-03-25 ENCOUNTER — Encounter: Payer: Self-pay | Admitting: Family Medicine

## 2021-03-30 DIAGNOSIS — H401332 Pigmentary glaucoma, bilateral, moderate stage: Secondary | ICD-10-CM | POA: Diagnosis not present

## 2021-03-30 DIAGNOSIS — Z23 Encounter for immunization: Secondary | ICD-10-CM | POA: Diagnosis not present

## 2021-04-02 ENCOUNTER — Encounter: Payer: Self-pay | Admitting: Family Medicine

## 2021-04-06 MED ORDER — ONDANSETRON HCL 4 MG PO TABS
4.0000 mg | ORAL_TABLET | Freq: Three times a day (TID) | ORAL | 1 refills | Status: DC | PRN
Start: 2021-04-06 — End: 2023-08-29

## 2021-04-20 ENCOUNTER — Other Ambulatory Visit: Payer: Self-pay | Admitting: Family Medicine

## 2021-04-20 DIAGNOSIS — R0982 Postnasal drip: Secondary | ICD-10-CM

## 2021-04-29 ENCOUNTER — Ambulatory Visit: Payer: Medicare Other | Admitting: Neurology

## 2021-05-03 ENCOUNTER — Other Ambulatory Visit: Payer: Self-pay | Admitting: Family Medicine

## 2021-05-04 DIAGNOSIS — H401332 Pigmentary glaucoma, bilateral, moderate stage: Secondary | ICD-10-CM | POA: Diagnosis not present

## 2021-05-04 NOTE — Progress Notes (Signed)
Cardiology Office Note   Date:  05/05/2021   ID:  Alvarado, Edward 06-May-1981, MRN [id number]  PCP:  [name], MD  Cardiologist:   None Referring:  [name], MD  Chief Complaint  Patient presents with   Loss of Consciousness       History of Present Illness: Edward Alvarado is a 40 y.o. male who is referred by [name], MD  for evaluation of syncope.  He was in the ED  in March for this.  I reviewed these records for this visit.    He had a negative head CT.  He gets around in a wheelchair.  He is deaf and non verbal.  He has CP.  He is here with his parents and Nurse, learning disability.  He has had 3 episodes of possible syncope.  The first episode occurred after a coughing episode.  He had been eating.  He developed sweating and salivation.  He had less than 1 minute of LOC.  On March the 23rd was the ED visit after having AMS at the group home where he lives.  He had unresponsiveness and he was not holding his head up.  He could not focus and these symptoms lasted for several hours.  The third episode was may the 27th.  He had eaten and was going to the bathroom and had an apparent LOC.    He has had no cardiac history except that years ago he was at [facility] [place] [facility] [place] and had a research study.  He had a mildly abnormal EKG and I reviewed these records for this visit.  He saw a pediatric cardiologist and had a normal exam.  EKG demonstrated RAD.    He, through the sign language interpreter, denies any cardiovascular symptoms.  He signs repeatedly that he feels "fine."  The patient denies any new symptoms such as chest discomfort, neck or arm discomfort. There has been no new shortness of breath, PND or orthopnea. There have been no reported palpitations, presyncope or syncope.   Past Medical History:  Diagnosis Date   Allergic rhinitis    Allergy    Atrophy, cortical    Lower left ventrical   Cerebral palsy (HCC)    W/C BOUND-ABLE  TO TRANSFER W/C TO BED -DOES REQUIRE SOME HELP IN BATHROOM WITH CLOTHES--LIVES IN GROUP HOME CALLED [name]-HIS PARENTS ARE HIS LEGAL GUARDIANS   Complication of anesthesia    JAN 2012 EYE SURGERY AT [facility]--LMA WAS USED AND PT EXPERIENCED AIRWAY / OXYGENATION PROBLEMS.  PT HAD SUBSEQUENT SURGERIES  AFTER JAN 2012 AT [facility] AND PARENTS TOLD ET USED AND PT DID FINE.  PT HAD NO ANESTHESIA PROBLEMS WITH SURGERIES PRIOR TO THE JAN 2012 SURGERY.   Deafness    hearing aids-NOT WEARING AT PRESENT TIME; ABLE TO DO SIGN LANGUAGE WITH HIS PARENTS   Glaucoma    Hyperlipidemia    Kidney stone    Platelets decreased (HCC)    Prepatellar bursitis    Hospitalized in 2012 for sepsis secondary to bursisiitis   Scoliosis     Past Surgical History:  Procedure Laterality Date   ADENOIDECTOMY     CYSTOSCOPY/RETROGRADE/URETEROSCOPY  10/01/2012   Procedure: CYSTOSCOPY/RETROGRADE/URETEROSCOPY;  Surgeon: Crecencio Mc, MD;  Location: [facility];  Service: Urology;  Laterality: Left;  laser lithotripsy stone extraction on left   eye surgery  2012   [facility]   EYE SURGERY     4 RIGHT EYE SURGERIES FOR GLAUCOMA AND ONE  RT EYE CATARACT EXTRACTION; LEFT EYE CATARACT EXTRACTION WITH  I STENTPROCEDURE FOR GLAUCOMA   HOLMIUM LASER APPLICATION  10/01/2012   Procedure: HOLMIUM LASER APPLICATION;  Surgeon: Crecencio Mc, MD;  Location: [facility];  Service: Urology;  Laterality: Left;   multiple orthopedic procedures     MYRINGOTOMY     SELECTIVE RHIZOTOMY PROCEDURE TO HELP REDUCE SPASTICITY AND CONTRACTURES       Current Outpatient Medications  Medication Sig Dispense Refill   benzonatate (TESSALON) 100 MG capsule Take 1 capsule (100 mg total) by mouth 2 (two) times daily as needed for cough. 20 capsule 0   carbamide peroxide (DEBROX) 6.5 % OTIC solution Place 5 drops into both ears once a week. 15 mL 5   cholecalciferol (VITAMIN D) 25 MCG (1000 UNIT) tablet TAKE 1 TABLET BY MOUTH ONCE DAILY 30 tablet 11   COMBIGAN 0.2-0.5 %  ophthalmic solution INSTILL ONE DROP INTO EACH EYE 2 TIMES DAILY 5 mL 9   dextromethorphan (DELSYM) 30 MG/5ML liquid Take 5 mLs (30 mg total) by mouth 2 (two) times daily. (Patient taking differently: Take 30 mg by mouth at bedtime as needed.) 148 mL 0   fluticasone (FLONASE) 50 MCG/ACT nasal spray USE 2 SPRAYS in EACH NOSTRIL ONCE DAILY 16 g 11   methazolamide (NEPTAZANE) 50 MG tablet Take 1 tablet by mouth 2 (two) times daily.     montelukast (SINGULAIR) 10 MG tablet TAKE 1 TABLET BY MOUTH AT BEDTIME 30 tablet 11   multivitamin (ONE-A-DAY MEN'S) TABS tablet TAKE (1) TABLET BY MOUTH ONCE DAILY. 30 tablet 11   ondansetron (ZOFRAN) 4 MG tablet Take 1 tablet (4 mg total) by mouth every 8 (eight) hours as needed for nausea or vomiting. 20 tablet 1   Psyllium (METAMUCIL PREMIUM BLEND) 52.63 % POWD Take 1 Scoop by mouth daily. Take 1 tsp of metamucil in 4 oz of water daily to titrate to 1 bowel movement daily. If more than one BM daily, can increase to 2 tsp in 8 oz of water. If patient is sick, do not give. 414 g 3   Skin Protectants, Misc. (EUCERIN) cream Apply topically as needed (dry skin). Generic okay 397 g 0   sodium chloride (OCEAN) 0.65 % SOLN nasal spray Place 1 spray into both nostrils as needed for congestion. As needed for allergy symptoms 1 Bottle 12   No current facility-administered medications for this visit.    Allergies:   Iodine, Afrin [oxymetazoline], and Betadine [povidone iodine]    Social History:  The patient  reports that he has never smoked. He has never used smokeless tobacco. He reports that he does not drink alcohol and does not use drugs.   Family History:  The patient's family history includes Alzheimer's disease in his paternal grandmother; Asthma in his maternal grandmother and mother; Hodgkin's lymphoma in his paternal uncle; Hyperlipidemia in his father and mother; Hypertension in his mother; Kidney Stones in his mother; Osteoporosis in his mother; Stroke in his  paternal grandfather and paternal grandmother.    ROS:  Please see the history of present illness.   Otherwise, review of systems are positive for none.   All other systems are reviewed and negative.  His reporting is very limited secondary to decreased communication.     PHYSICAL EXAM: VS:  BP (!) 136/101   Pulse 97   Ht 5' 9.5" (1.765 m)   Wt 156 lb (70.8 kg)   SpO2 93%   BMI 22.71 kg/m  , BMI Body  mass index is 22.71 kg/m. GEN:  No distress NECK:  No jugular venous distention at 90 degrees, waveform within normal limits, carotid upstroke brisk and symmetric, no bruits, no thyromegaly LYMPHATICS:  No cervical adenopathy LUNGS:  Clear to auscultation bilaterally BACK:  No CVA tenderness CHEST:  Unremarkable HEART:  S1 and S2 within normal limits, no S3, no S4, no clicks, no rubs, no murmurs ABD:  Positive bowel sounds normal in frequency in pitch, no bruits, no rebound, no guarding, unable to assess midline mass or bruit with the patient seated. EXT:  2 plus pulses throughout, moderate edema, no cyanosis no clubbing SKIN:  No rashes no nodules NEURO:  Spastic hemipalegia.   PSYCH:  Unable to assess     EKG:  EKG is not ordered today. The ekg ordered 02/03/21 demonstrates NSR, rate 81, RAD.  No acute ST T wave changes.     Recent Labs: 02/03/2021: BUN 17; Creatinine, Ser 0.93; Hemoglobin 15.8; Platelets 133; Potassium 4.4; Sodium 135    Lipid Panel    Component Value Date/Time   CHOL 213 (H) 07/13/2020 0849   TRIG 153 (H) 07/13/2020 0849   HDL 36 (L) 07/13/2020 0849   CHOLHDL 5.9 (H) 07/13/2020 0849   CHOLHDL 4.3 06/20/2016 0825   VLDL 23 06/20/2016 0825   LDLCALC 149 (H) 07/13/2020 0849   LDLDIRECT 157 (H) 12/29/2020 1206   LDLDIRECT 156 (H) 09/30/2014 0850      Wt Readings from Last 3 Encounters:  05/05/21 156 lb (70.8 kg)  02/22/21 156 lb 12.8 oz (71.1 kg)  12/29/20 162 lb (73.5 kg)      Other studies Reviewed: Additional studies/ records that were  reviewed today include: . Review of the above records demonstrates:  Please see elsewhere in the note.     ASSESSMENT AND PLAN:  Syncope:  The etiology of his syncope is not clear.  I worry about a vagal episode.  There is not a clear trigger but he could have a difficulty with swallowing or aspiration with cough.  He is due to have a neurology work up.  I agree with this and I will check an echo and 4 week event monitor.  I have also asked for the group home to try to check a BP and HR during an episode.  Follow up with be based on future symptoms and these results.      Current medicines are reviewed at length with the patient today.  The patient does not have concerns regarding medicines.  The following changes have been made:  no change  Labs/ tests ordered today include:   Orders Placed This Encounter  Procedures   Cardiac event monitor   ECHOCARDIOGRAM COMPLETE      Disposition:   FU with me based on future symptoms and the results of above.      Signed, Rollene Rotunda, MD  05/05/2021 10:22 PM    [facility]

## 2021-05-05 ENCOUNTER — Other Ambulatory Visit: Payer: Self-pay

## 2021-05-05 ENCOUNTER — Ambulatory Visit (INDEPENDENT_AMBULATORY_CARE_PROVIDER_SITE_OTHER): Payer: Medicare Other | Admitting: Cardiology

## 2021-05-05 ENCOUNTER — Encounter: Payer: Self-pay | Admitting: Cardiology

## 2021-05-05 VITALS — BP 136/101 | HR 97 | Ht 69.5 in | Wt 156.0 lb

## 2021-05-05 DIAGNOSIS — R55 Syncope and collapse: Secondary | ICD-10-CM

## 2021-05-05 NOTE — Patient Instructions (Signed)
Medication Instructions:  Your physician recommends that you continue on your current medications as directed. Please refer to the Current Medication list given to you today.  *If you need a refill on your cardiac medications before your next appointment, please call your pharmacy*   Lab Work: None ordered.   Testing/Procedures: To be scheduled at [facility]. Your physician has requested that you have an echocardiogram. Echocardiography is a painless test that uses sound waves to create images of your heart. It provides your doctor with information about the size and shape of your heart and how well your heart's chambers and valves are working. This procedure takes approximately one hour. There are no restrictions for this procedure.    Follow-Up: At [facility] you and your health needs are our priority.  As part of our continuing mission to provide you with exceptional heart care, we have created designated Provider Care Teams.  These Care Teams include your primary Cardiologist (physician) and Advanced Practice Providers (APPs -  Physician Assistants and Nurse Practitioners) who all work together to provide you with the care you need, when you need it.  We recommend signing up for the patient portal called "MyChart".  Sign up information is provided on this After Visit Summary.  MyChart is used to connect with patients for Virtual Visits (Telemedicine).  Patients are able to view lab/test results, encounter notes, upcoming appointments, etc.  Non-urgent messages can be sent to your provider as well.   To learn more about what you can do with MyChart, go to [email or web address].    Your next appointment:   As needed  The format for your next appointment:   In Person  Provider:   Rollene Rotunda, MD   Other Instructions Your physician has recommended that you wear an event monitor. Event monitors are medical devices that record the heart's electrical activity. Doctors most  often [place] these monitors to diagnose arrhythmias. Arrhythmias are problems with the speed or rhythm of the heartbeat. The monitor is a small, portable device. You can wear one while you do your normal daily activities. This is usually used to diagnose what is causing palpitations/syncope (passing out).

## 2021-05-14 NOTE — Progress Notes (Signed)
NEUROLOGY CONSULTATION NOTE  Edward Alvarado MRN: [id number] DOB: 1981-06-09  Referring provider: Pearlean Brownie, MD Primary care provider: [name], MD  Reason for consult:  syncope  Assessment/Plan:   Recurrent spells - the first and third events may have been vasovagal.  The second event is less clear as he was found already with altered mental status.   Will get routine EEG.  If unremarkable, would proceed with 24 hour ambulatory EEG Further recommendations pending results.  Follow up after testing.   Subjective:  Edward Alvarado is a 40 year old male with cerebral palsy and deafness who presents for syncope.  History supplemented by ED, PCP and cardiology notes.  He is accompanied by his mother.  Sign language interpreter present.  Patient has cerebral palsy with history of perinatal hyperbilirubinemia.  He is nonambulatory, nonverbal and is deaf.  No history of seizures.  In February 9, he had just finished lunch when he started salivating and coughing.  He slumped and became unresponsive for less than a minute.  He indicated that his "heart stopped".  It was questioned if he had been choking on food.  He had another episode on March 23.  He was found in bed acting altered.  He was seen in the ED where CT head personally reviewed was unremarkable.  Labs and ECG were unremarkable..  The third event occurred in May.  He had just finished eating and went to use the toilet.  The caregiver came in to assist and he was found "in a daze".  He had trouble focusing and could not communicate.  He felt weak and fatigued.  No seizure activity or incontinence was noted.    He saw cardiology on 05/05/2021.  He is now with a heart monitor.  PAST MEDICAL HISTORY: Past Medical History:  Diagnosis Date   Allergic rhinitis    Allergy    Atrophy, cortical    Lower left ventrical   Cerebral palsy (HCC)    W/C BOUND-ABLE TO TRANSFER W/C TO BED -DOES REQUIRE SOME HELP IN BATHROOM WITH  CLOTHES--LIVES IN GROUP HOME CALLED [name]-HIS PARENTS ARE HIS LEGAL GUARDIANS   Complication of anesthesia    JAN 2012 EYE SURGERY AT [facility]--LMA WAS USED AND PT EXPERIENCED AIRWAY / OXYGENATION PROBLEMS.  PT HAD SUBSEQUENT SURGERIES  AFTER JAN 2012 AT [facility] AND PARENTS TOLD ET USED AND PT DID FINE.  PT HAD NO ANESTHESIA PROBLEMS WITH SURGERIES PRIOR TO THE JAN 2012 SURGERY.   Deafness    hearing aids-NOT WEARING AT PRESENT TIME; ABLE TO DO SIGN LANGUAGE WITH HIS PARENTS   Glaucoma    Hyperlipidemia    Kidney stone    Platelets decreased (HCC)    Prepatellar bursitis    Hospitalized in 2012 for sepsis secondary to bursisiitis   Scoliosis     PAST SURGICAL HISTORY: Past Surgical History:  Procedure Laterality Date   ADENOIDECTOMY     CYSTOSCOPY/RETROGRADE/URETEROSCOPY  10/01/2012   Procedure: CYSTOSCOPY/RETROGRADE/URETEROSCOPY;  Surgeon: Crecencio Mc, MD;  Location: [facility];  Service: Urology;  Laterality: Left;  laser lithotripsy stone extraction on left   eye surgery  2012   [facility]   EYE SURGERY     4 RIGHT EYE SURGERIES FOR GLAUCOMA AND ONE  RT EYE CATARACT EXTRACTION; LEFT EYE CATARACT EXTRACTION WITH  I STENTPROCEDURE FOR GLAUCOMA   HOLMIUM LASER APPLICATION  10/01/2012   Procedure: HOLMIUM LASER APPLICATION;  Surgeon: Crecencio Mc, MD;  Location: [facility];  Service: Urology;  Laterality:  Left;   multiple orthopedic procedures     MYRINGOTOMY     SELECTIVE RHIZOTOMY PROCEDURE TO HELP REDUCE SPASTICITY AND CONTRACTURES      MEDICATIONS: Current Outpatient Medications on File Prior to Visit  Medication Sig Dispense Refill   benzonatate (TESSALON) 100 MG capsule Take 1 capsule (100 mg total) by mouth 2 (two) times daily as needed for cough. 20 capsule 0   carbamide peroxide (DEBROX) 6.5 % OTIC solution Place 5 drops into both ears once a week. 15 mL 5   cholecalciferol (VITAMIN D) 25 MCG (1000 UNIT) tablet TAKE 1 TABLET BY MOUTH ONCE DAILY 30 tablet 11   COMBIGAN 0.2-0.5 % ophthalmic  solution INSTILL ONE DROP INTO EACH EYE 2 TIMES DAILY 5 mL 9   dextromethorphan (DELSYM) 30 MG/5ML liquid Take 5 mLs (30 mg total) by mouth 2 (two) times daily. (Patient taking differently: Take 30 mg by mouth at bedtime as needed.) 148 mL 0   fluticasone (FLONASE) 50 MCG/ACT nasal spray USE 2 SPRAYS in EACH NOSTRIL ONCE DAILY 16 g 11   methazolamide (NEPTAZANE) 50 MG tablet Take 1 tablet by mouth 2 (two) times daily.     montelukast (SINGULAIR) 10 MG tablet TAKE 1 TABLET BY MOUTH AT BEDTIME 30 tablet 11   multivitamin (ONE-A-DAY MEN'S) TABS tablet TAKE 1 TABLET BY MOUTH ONCE DAILY 30 tablet 11   ondansetron (ZOFRAN) 4 MG tablet Take 1 tablet (4 mg total) by mouth every 8 (eight) hours as needed for nausea or vomiting. 20 tablet 1   Psyllium (METAMUCIL PREMIUM BLEND) 52.63 % POWD Take 1 Scoop by mouth daily. Take 1 tsp of metamucil in 4 oz of water daily to titrate to 1 bowel movement daily. If more than one BM daily, can increase to 2 tsp in 8 oz of water. If patient is sick, do not give. 414 g 3   Skin Protectants, Misc. (EUCERIN) cream Apply topically as needed (dry skin). Generic okay 397 g 0   sodium chloride (OCEAN) 0.65 % SOLN nasal spray Place 1 spray into both nostrils as needed for congestion. As needed for allergy symptoms 1 Bottle 12   No current facility-administered medications on file prior to visit.    ALLERGIES: Allergies  Allergen Reactions   Iodine Anaphylaxis    REACTION: sensitivity, Patient has never had a reaction. Patient's mother is concerned that because she is allergic to iodine that the patient may be allergic to iodine. Family hx   Afrin [Oxymetazoline] Other (See Comments)    Has Glaucoma.  Cannot take this medication.   Betadine [Povidone Iodine]     Unsure     FAMILY HISTORY: Family History  Problem Relation Age of Onset   Asthma Mother    Kidney Stones Mother    Osteoporosis Mother    Hypertension Mother    Hyperlipidemia Mother    Hyperlipidemia  Father    Hodgkin's lymphoma Paternal Uncle    Alzheimer's disease Paternal Grandmother    Stroke Paternal Grandmother    Asthma Maternal Grandmother    Stroke Paternal Grandfather     Objective:  Blood pressure 129/84, pulse 96, height 5\' 9"  (1.753 m), weight 156 lb (70.8 kg), SpO2 98 %. General: No acute distress.  Patient appears well-groomed.   Head:  Normocephalic/atraumatic Eyes:  fundi examined but not visualized Neck: supple, no paraspinal tenderness, full range of motion Back: No paraspinal tenderness Heart: regular rate and rhythm Lungs: Clear to auscultation bilaterally. Vascular: No carotid bruits. Neurological Exam:  Mental status: alert and oriented.  Non-verbal.  Able to communicate via sign language. Cranial nerves: CN I: not tested CN II: pupils equal, round and reactive to light, visual fields intact CN III, IV, VI:  full range of motion, asymmetry of eyes, no ptosis CN V: facial sensation intact. CN VII: upper and lower face symmetric CN VIII: hearing intact CN IX, X: gag intact, uvula midline CN XI: sternocleidomastoid and trapezius muscles intact CN XII: tongue midline Bulk & Tone: increased tone in extremities with flexor posturing of right wrist. Motor:  muscle strength 5/5 throughout Sensation:  temperature and vibratory sensation intact. Deep Tendon Reflexes:  2+ throughout,  toes downgoing.   Finger to nose testing:  Without dysmetria.   Gait:  Non-ambulatory.  In wheelchair.    Thank you for allowing me to take part in the care of this patient.  Shon Millet, DO  CC:  [name], MD  Pearlean Brownie, MD

## 2021-05-15 ENCOUNTER — Ambulatory Visit (INDEPENDENT_AMBULATORY_CARE_PROVIDER_SITE_OTHER): Payer: Medicare Other

## 2021-05-15 DIAGNOSIS — R55 Syncope and collapse: Secondary | ICD-10-CM | POA: Diagnosis not present

## 2021-05-18 ENCOUNTER — Ambulatory Visit (INDEPENDENT_AMBULATORY_CARE_PROVIDER_SITE_OTHER): Payer: Medicare Other | Admitting: Neurology

## 2021-05-18 ENCOUNTER — Other Ambulatory Visit: Payer: Self-pay

## 2021-05-18 ENCOUNTER — Encounter: Payer: Self-pay | Admitting: Neurology

## 2021-05-18 VITALS — BP 129/84 | HR 96 | Ht 69.0 in | Wt 156.0 lb

## 2021-05-18 DIAGNOSIS — R404 Transient alteration of awareness: Secondary | ICD-10-CM

## 2021-05-18 NOTE — Patient Instructions (Signed)
Will check routine EEG.  If that is not revealing, will check a 24 hour ambulatory EEG. Further recommendations pending results. Follow up after testing.

## 2021-05-19 ENCOUNTER — Ambulatory Visit (INDEPENDENT_AMBULATORY_CARE_PROVIDER_SITE_OTHER): Payer: Medicare Other | Admitting: Neurology

## 2021-05-19 DIAGNOSIS — R401 Stupor: Secondary | ICD-10-CM

## 2021-05-19 DIAGNOSIS — R404 Transient alteration of awareness: Secondary | ICD-10-CM | POA: Diagnosis not present

## 2021-05-20 ENCOUNTER — Ambulatory Visit (INDEPENDENT_AMBULATORY_CARE_PROVIDER_SITE_OTHER): Payer: Medicare Other | Admitting: Neurology

## 2021-05-20 ENCOUNTER — Other Ambulatory Visit: Payer: Self-pay

## 2021-05-20 DIAGNOSIS — R404 Transient alteration of awareness: Secondary | ICD-10-CM

## 2021-05-20 NOTE — Procedures (Signed)
ELECTROENCEPHALOGRAM REPORT  Date of Study: 05/19/2021  Patient's Name: Edward Alvarado MRN: [id number] Date of Birth: 03-02-1981   Clinical History: 40 year old male with cerebral palsy presenting with recurrent episodes of decreased awareness  Medications: Methazolamide ondansetron  Technical Summary: A multichannel digital EEG recording measured by the international 10-20 system with electrodes applied with paste and impedances below 5000 ohms performed in our laboratory with EKG monitoring in an awake and drowsy patient.  Photic stimulation was performed.  The digital EEG was referentially recorded, reformatted, and digitally filtered in a variety of bipolar and referential montages for optimal display.    Description: The patient is awake and drowsy during the recording.  During maximal wakefulness, there is a symmetric, medium voltage 9-10 Hz posterior dominant rhythm that attenuates with eye opening.  The record is symmetric.  During drowsiness,, there is an increase in theta slowing of the background.  Stage 2 sleep was not seen.  Photic stimulation did not elicit any abnormalities.  There were no epileptiform discharges or electrographic seizures seen.    EKG lead was unremarkable.  Impression: This awake and drowsy EEG is normal.    Clinical Correlation: A normal EEG does not exclude a clinical diagnosis of epilepsy.  If further clinical questions remain, prolonged EEG may be helpful.  Clinical correlation is advised.   Shon Millet, DO

## 2021-05-24 ENCOUNTER — Telehealth: Payer: Self-pay | Admitting: Neurology

## 2021-05-24 NOTE — Telephone Encounter (Signed)
EEGs are normal.  No further recommendations at this time

## 2021-05-24 NOTE — Telephone Encounter (Signed)
error 

## 2021-05-24 NOTE — Telephone Encounter (Signed)
LmoVM for pt to call back.

## 2021-05-24 NOTE — Procedures (Signed)
ELECTROENCEPHALOGRAM REPORT  Dates of Recording: 05/20/2021 at 13:54 to 05/21/2021 at 14:04  Patient's Name: Edward Alvarado MRN: [id number] Date of Birth: February 11, 1981   Procedure: 24-hour ambulatory EEG  History: 40 year old male with cerebral palsy with recurrent episodes of transient altered awareness/transient altered sensorium  Medications: Zofran  Technical Summary: This is a 24-hour multichannel digital EEG recording measured by the international 10-20 system with electrodes applied with paste and impedances below 5000 ohms performed as portable with EKG monitoring.  The digital EEG was referentially recorded, reformatted, and digitally filtered in a variety of bipolar and referential montages for optimal display.    DESCRIPTION OF RECORDING: During maximal wakefulness, the background activity consisted of a symmetric 11Hz  posterior dominant rhythm which was reactive to eye opening.  There were no epileptiform discharges or focal slowing seen in wakefulness.  During the recording, the patient progresses through wakefulness, drowsiness, and Stage 2 sleep.  Again, there were no epileptiform discharges seen.  Events:  There were no electrographic seizures seen.  EKG lead was unremarkable.  IMPRESSION: This 24-hour ambulatory EEG study is normal.    CLINICAL CORRELATION: A normal EEG does not exclude a clinical diagnosis of epilepsy.  If further clinical questions remain, inpatient video EEG monitoring may be helpful.   , DO

## 2021-05-25 ENCOUNTER — Telehealth: Payer: Self-pay | Admitting: Neurology

## 2021-05-25 NOTE — Telephone Encounter (Signed)
LMOVM call office back.

## 2021-05-25 NOTE — Telephone Encounter (Signed)
Pt mother called in regarding the message she recvd yesterday. Stated she couldn't hear name or number. Would like a call back please.

## 2021-05-26 ENCOUNTER — Ambulatory Visit: Payer: Medicare Other | Admitting: Family Medicine

## 2021-05-26 NOTE — Telephone Encounter (Signed)
PT mother advised of EEG results.

## 2021-05-28 ENCOUNTER — Telehealth: Payer: Self-pay

## 2021-05-28 ENCOUNTER — Telehealth: Payer: Self-pay | Admitting: Cardiovascular Disease

## 2021-05-28 ENCOUNTER — Ambulatory Visit (HOSPITAL_COMMUNITY): Payer: Medicare Other | Attending: Cardiovascular Disease

## 2021-05-28 ENCOUNTER — Other Ambulatory Visit: Payer: Self-pay

## 2021-05-28 DIAGNOSIS — R55 Syncope and collapse: Secondary | ICD-10-CM | POA: Diagnosis not present

## 2021-05-28 LAB — ECHOCARDIOGRAM COMPLETE
Area-P 1/2: 4.06 cm2
S' Lateral: 2.5 cm

## 2021-05-28 NOTE — Telephone Encounter (Signed)
Pt's mother called back, reports to this RN that [facility] had now received a copy of pt's echocardiogram and determined he can proceed with procedure. This RN advised pt to still request a clearance form, pt's mother reports she was told by the office she did not need one. Pt's mother instructed to call back with any questions or concerns. Pt's mother verbalized understanding.

## 2021-05-28 NOTE — Telephone Encounter (Signed)
Ed's mother is calling requesting her son's Echo results. She states [facility] is need the verbal results as well due to him being scheduled for a procedure Monday and needing the results before it can be performed. She states [facility] contact number is [phone]. Please advise.

## 2021-05-28 NOTE — Telephone Encounter (Signed)
See other phone note on 7/15.

## 2021-05-28 NOTE — Telephone Encounter (Signed)
Received a call from Jenel Lucks, pt's mother (ok per DPR). Pt's mother reports the pt is to have an eye procedure on Monday July 16, and wanted to know if there were any restrictions for him, and also if his echocardiogram had resulted. Pt had the echocardiogram done today, this RN told pt's mother the results were not reviewed yet. This RN also let pt's mother know the office doing the procedure would need to send a clearance form to our office for patient to be cleared. Pt given fax number and instructions to request clearance form. Pt verbalized understanding, all questions/concerns addressed at this time.

## 2021-05-28 NOTE — Telephone Encounter (Signed)
This RN passed on the following from Dr. Antoine Poche to pt's mother:   Rollene Rotunda, MD  You 33 minutes ago (5:05 PM)    I resulted the echo.  I don't see a reason he could not have this procedure.  I am waiting for his monitor.  Clearance can go through the usual channel when we get the information    Pt's mother said the eye doctor was pleased with the echo and that the plan was to proceed with surgery. This RN told patient this does not mean pt has cardiac clearance, and pt's mother should call surgical office for clarification. Pt's mother aware, verbalized understanding.

## 2021-05-31 DIAGNOSIS — G801 Spastic diplegic cerebral palsy: Secondary | ICD-10-CM | POA: Diagnosis not present

## 2021-05-31 DIAGNOSIS — H401313 Pigmentary glaucoma, right eye, severe stage: Secondary | ICD-10-CM | POA: Diagnosis not present

## 2021-05-31 DIAGNOSIS — Z79899 Other long term (current) drug therapy: Secondary | ICD-10-CM | POA: Diagnosis not present

## 2021-06-03 ENCOUNTER — Other Ambulatory Visit: Payer: Self-pay | Admitting: Family Medicine

## 2021-06-04 IMAGING — DX DG CHEST 1V
1 series · 1 of 1 positions shown · non-contrast
Comparison: None.

CLINICAL DATA: Syncope or seizure

EXAM:
CHEST  1 VIEW

[chest]
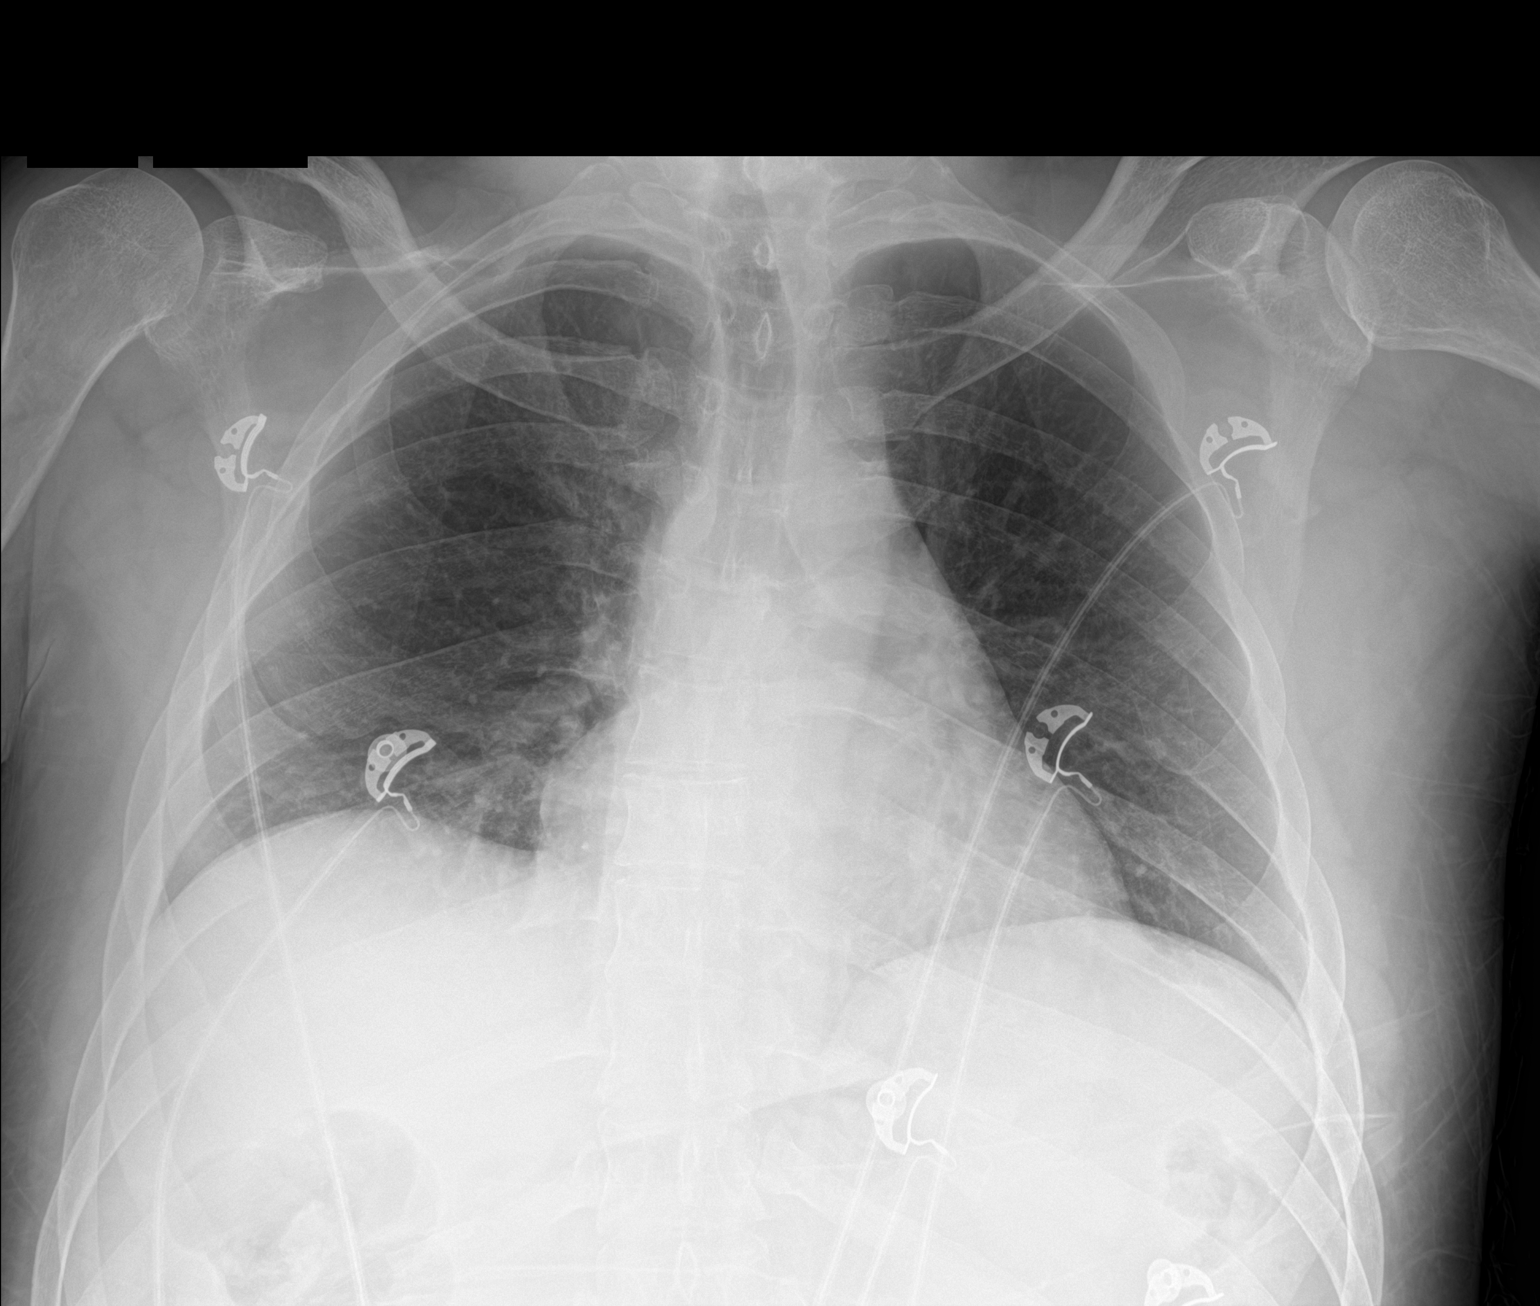

[1 of 1 positions shown; findings below may reference images not displayed]

FINDINGS: The heart size and mediastinal contours are within normal limits.
Both lungs are clear. The visualized skeletal structures are
unremarkable.
IMPRESSION: No active disease.

## 2021-06-22 ENCOUNTER — Other Ambulatory Visit: Payer: Self-pay

## 2021-06-22 ENCOUNTER — Emergency Department (HOSPITAL_BASED_OUTPATIENT_CLINIC_OR_DEPARTMENT_OTHER): Payer: Medicare Other

## 2021-06-22 ENCOUNTER — Encounter (HOSPITAL_BASED_OUTPATIENT_CLINIC_OR_DEPARTMENT_OTHER): Payer: Self-pay | Admitting: Urology

## 2021-06-22 ENCOUNTER — Inpatient Hospital Stay (HOSPITAL_BASED_OUTPATIENT_CLINIC_OR_DEPARTMENT_OTHER)
Admission: EM | Admit: 2021-06-22 | Discharge: 2021-06-26 | DRG: 871 | Disposition: A | Discharge status: Home / Self Care | Payer: Medicare Other | Attending: Internal Medicine | Admitting: Internal Medicine

## 2021-06-22 ENCOUNTER — Telehealth: Payer: Self-pay

## 2021-06-22 DIAGNOSIS — N202 Calculus of kidney with calculus of ureter: Secondary | ICD-10-CM | POA: Diagnosis not present

## 2021-06-22 DIAGNOSIS — J852 Abscess of lung without pneumonia: Secondary | ICD-10-CM | POA: Diagnosis not present

## 2021-06-22 DIAGNOSIS — K5900 Constipation, unspecified: Secondary | ICD-10-CM | POA: Diagnosis not present

## 2021-06-22 DIAGNOSIS — E785 Hyperlipidemia, unspecified: Secondary | ICD-10-CM | POA: Diagnosis present

## 2021-06-22 DIAGNOSIS — H409 Unspecified glaucoma: Secondary | ICD-10-CM | POA: Diagnosis present

## 2021-06-22 DIAGNOSIS — Z807 Family history of other malignant neoplasms of lymphoid, hematopoietic and related tissues: Secondary | ICD-10-CM | POA: Diagnosis not present

## 2021-06-22 DIAGNOSIS — K117 Disturbances of salivary secretion: Secondary | ICD-10-CM | POA: Diagnosis not present

## 2021-06-22 DIAGNOSIS — Z8349 Family history of other endocrine, nutritional and metabolic diseases: Secondary | ICD-10-CM

## 2021-06-22 DIAGNOSIS — Z23 Encounter for immunization: Secondary | ICD-10-CM | POA: Diagnosis not present

## 2021-06-22 DIAGNOSIS — E559 Vitamin D deficiency, unspecified: Secondary | ICD-10-CM | POA: Diagnosis not present

## 2021-06-22 DIAGNOSIS — G809 Cerebral palsy, unspecified: Secondary | ICD-10-CM | POA: Diagnosis not present

## 2021-06-22 DIAGNOSIS — Z87442 Personal history of urinary calculi: Secondary | ICD-10-CM | POA: Diagnosis not present

## 2021-06-22 DIAGNOSIS — Z993 Dependence on wheelchair: Secondary | ICD-10-CM

## 2021-06-22 DIAGNOSIS — R1312 Dysphagia, oropharyngeal phase: Secondary | ICD-10-CM | POA: Diagnosis present

## 2021-06-22 DIAGNOSIS — A419 Sepsis, unspecified organism: Principal | ICD-10-CM | POA: Diagnosis present

## 2021-06-22 DIAGNOSIS — Z8262 Family history of osteoporosis: Secondary | ICD-10-CM | POA: Diagnosis not present

## 2021-06-22 DIAGNOSIS — Z888 Allergy status to other drugs, medicaments and biological substances status: Secondary | ICD-10-CM

## 2021-06-22 DIAGNOSIS — J90 Pleural effusion, not elsewhere classified: Secondary | ICD-10-CM | POA: Diagnosis not present

## 2021-06-22 DIAGNOSIS — Z8249 Family history of ischemic heart disease and other diseases of the circulatory system: Secondary | ICD-10-CM

## 2021-06-22 DIAGNOSIS — Z20822 Contact with and (suspected) exposure to covid-19: Secondary | ICD-10-CM | POA: Diagnosis present

## 2021-06-22 DIAGNOSIS — Z825 Family history of asthma and other chronic lower respiratory diseases: Secondary | ICD-10-CM

## 2021-06-22 DIAGNOSIS — N201 Calculus of ureter: Secondary | ICD-10-CM | POA: Diagnosis present

## 2021-06-22 DIAGNOSIS — Z823 Family history of stroke: Secondary | ICD-10-CM

## 2021-06-22 DIAGNOSIS — H9190 Unspecified hearing loss, unspecified ear: Secondary | ICD-10-CM | POA: Diagnosis not present

## 2021-06-22 DIAGNOSIS — J851 Abscess of lung with pneumonia: Secondary | ICD-10-CM | POA: Diagnosis present

## 2021-06-22 DIAGNOSIS — R109 Unspecified abdominal pain: Secondary | ICD-10-CM | POA: Diagnosis not present

## 2021-06-22 DIAGNOSIS — R054 Cough syncope: Secondary | ICD-10-CM | POA: Diagnosis not present

## 2021-06-22 DIAGNOSIS — Z820 Family history of epilepsy and other diseases of the nervous system: Secondary | ICD-10-CM | POA: Diagnosis not present

## 2021-06-22 DIAGNOSIS — M419 Scoliosis, unspecified: Secondary | ICD-10-CM | POA: Diagnosis not present

## 2021-06-22 DIAGNOSIS — J690 Pneumonitis due to inhalation of food and vomit: Secondary | ICD-10-CM | POA: Diagnosis present

## 2021-06-22 DIAGNOSIS — R059 Cough, unspecified: Secondary | ICD-10-CM

## 2021-06-22 DIAGNOSIS — N200 Calculus of kidney: Secondary | ICD-10-CM

## 2021-06-22 DIAGNOSIS — Z79899 Other long term (current) drug therapy: Secondary | ICD-10-CM

## 2021-06-22 DIAGNOSIS — R1310 Dysphagia, unspecified: Secondary | ICD-10-CM | POA: Diagnosis not present

## 2021-06-22 DIAGNOSIS — J189 Pneumonia, unspecified organism: Secondary | ICD-10-CM | POA: Diagnosis not present

## 2021-06-22 LAB — URINALYSIS, ROUTINE W REFLEX MICROSCOPIC
Glucose, UA: NEGATIVE mg/dL
Ketones, ur: >80 mg/dL — AB
Leukocytes,Ua: NEGATIVE
Nitrite: NEGATIVE
Protein, ur: 100 mg/dL — AB
Specific Gravity, Urine: >1.03 — ABNORMAL HIGH (ref 1.005–1.030)
pH: 5.5 (ref 5.0–8.0)

## 2021-06-22 LAB — CBC WITH DIFFERENTIAL/PLATELET
Abs Immature Granulocytes: 0.14 10*3/uL — ABNORMAL HIGH (ref 0.00–0.07)
Basophils Absolute: 0.1 10*3/uL (ref 0.0–0.1)
Basophils Relative: 0 %
Eosinophils Absolute: 0 10*3/uL (ref 0.0–0.5)
Eosinophils Relative: 0 %
HCT: 44.3 % (ref 39.0–52.0)
Hemoglobin: 15.3 g/dL (ref 13.0–17.0)
Immature Granulocytes: 1 %
Lymphocytes Relative: 8 %
Lymphs Abs: 1.8 10*3/uL (ref 0.7–4.0)
MCH: 31.4 pg (ref 26.0–34.0)
MCHC: 34.5 g/dL (ref 30.0–36.0)
MCV: 91 fL (ref 80.0–100.0)
Monocytes Absolute: 2.5 10*3/uL — ABNORMAL HIGH (ref 0.1–1.0)
Monocytes Relative: 11 %
Neutro Abs: 18.3 10*3/uL — ABNORMAL HIGH (ref 1.7–7.7)
Neutrophils Relative %: 80 %
Platelets: 222 10*3/uL (ref 150–400)
RBC: 4.87 MIL/uL (ref 4.22–5.81)
RDW: 11.9 % (ref 11.5–15.5)
WBC: 22.8 10*3/uL — ABNORMAL HIGH (ref 4.0–10.5)
nRBC: 0 % (ref 0.0–0.2)

## 2021-06-22 LAB — COMPREHENSIVE METABOLIC PANEL
ALT: 19 U/L (ref 0–44)
AST: 20 U/L (ref 15–41)
Albumin: 4 g/dL (ref 3.5–5.0)
Alkaline Phosphatase: 81 U/L (ref 38–126)
Anion gap: 13 (ref 5–15)
BUN: 19 mg/dL (ref 6–20)
CO2: 23 mmol/L (ref 22–32)
Calcium: 9.3 mg/dL (ref 8.9–10.3)
Chloride: 99 mmol/L (ref 98–111)
Creatinine, Ser: 0.97 mg/dL (ref 0.61–1.24)
GFR, Estimated: >60 mL/min (ref >60)
Glucose, Bld: 139 mg/dL — ABNORMAL HIGH (ref 70–99)
Potassium: 3.8 mmol/L (ref 3.5–5.1)
Sodium: 135 mmol/L (ref 135–145)
Total Bilirubin: 1.9 mg/dL — ABNORMAL HIGH (ref 0.3–1.2)
Total Protein: 8.1 g/dL (ref 6.5–8.1)

## 2021-06-22 LAB — RESP PANEL BY RT-PCR (FLU A&B, COVID) ARPGX2
Influenza A by PCR: NEGATIVE
Influenza B by PCR: NEGATIVE
SARS Coronavirus 2 by RT PCR: NEGATIVE

## 2021-06-22 LAB — URINALYSIS, MICROSCOPIC (REFLEX): RBC / HPF: >50 RBC/hpf (ref 0–5)

## 2021-06-22 LAB — LACTIC ACID, PLASMA: Lactic Acid, Venous: 1.4 mmol/L (ref 0.5–1.9)

## 2021-06-22 LAB — LIPASE, BLOOD: Lipase: 25 U/L (ref 11–51)

## 2021-06-22 MED ORDER — SODIUM CHLORIDE 0.9 % IV BOLUS
1000.0000 mL | Freq: Once | INTRAVENOUS | Status: AC
Start: 2021-06-22 — End: 2021-06-22
  Administered 2021-06-22: 1000 mL via INTRAVENOUS

## 2021-06-22 MED ORDER — SODIUM CHLORIDE 0.9% FLUSH
3.0000 mL | Freq: Two times a day (BID) | INTRAVENOUS | Status: DC
  Administered 2021-06-24: 3 mL via INTRAVENOUS

## 2021-06-22 MED ORDER — SODIUM CHLORIDE 0.9 % IV SOLN
3.0000 g | Freq: Four times a day (QID) | INTRAVENOUS | Status: DC
  Administered 2021-06-23 – 2021-06-26 (×15): 3 g via INTRAVENOUS
  Filled 2021-06-22: qty 3
  Filled 2021-06-22: qty 8
  Filled 2021-06-22: qty 3
  Filled 2021-06-22 (×2): qty 8
  Filled 2021-06-22 (×10): qty 3
  Filled 2021-06-22: qty 8

## 2021-06-22 MED ORDER — MORPHINE SULFATE (PF) 4 MG/ML IV SOLN
4.0000 mg | Freq: Once | INTRAVENOUS | Status: AC
Start: 2021-06-22 — End: 2021-06-22
  Administered 2021-06-22: 4 mg via INTRAVENOUS
  Filled 2021-06-22: qty 1

## 2021-06-22 MED ORDER — ONDANSETRON HCL 4 MG/2ML IJ SOLN
4.0000 mg | Freq: Once | INTRAMUSCULAR | Status: AC
  Administered 2021-06-22: 4 mg via INTRAVENOUS
  Filled 2021-06-22: qty 2

## 2021-06-22 MED ORDER — ACETAMINOPHEN 325 MG PO TABS: 650.0000 mg | ORAL_TABLET | Freq: Four times a day (QID) | ORAL | Status: DC | PRN

## 2021-06-22 MED ORDER — LACTATED RINGERS IV SOLN: INTRAVENOUS | Status: AC

## 2021-06-22 MED ORDER — ONDANSETRON HCL 4 MG/2ML IJ SOLN
4.0000 mg | Freq: Four times a day (QID) | INTRAMUSCULAR | Status: DC | PRN
  Administered 2021-06-23: 4 mg via INTRAVENOUS
  Filled 2021-06-22: qty 2

## 2021-06-22 MED ORDER — ENOXAPARIN SODIUM 40 MG/0.4ML IJ SOSY
40.0000 mg | PREFILLED_SYRINGE | INTRAMUSCULAR | Status: DC
  Administered 2021-06-22 – 2021-06-23 (×2): 40 mg via SUBCUTANEOUS
  Filled 2021-06-22 (×2): qty 0.4

## 2021-06-22 MED ORDER — GUAIFENESIN ER 600 MG PO TB12
600.0000 mg | ORAL_TABLET | Freq: Two times a day (BID) | ORAL | Status: DC | PRN
Start: 2021-06-22 — End: 2021-06-26
  Administered 2021-06-23 – 2021-06-24 (×2): 600 mg via ORAL
  Filled 2021-06-22 (×2): qty 1

## 2021-06-22 MED ORDER — SENNOSIDES-DOCUSATE SODIUM 8.6-50 MG PO TABS: 1.0000 | ORAL_TABLET | Freq: Every evening | ORAL | Status: DC | PRN

## 2021-06-22 MED ORDER — BRIMONIDINE TARTRATE-TIMOLOL 0.2-0.5 % OP SOLN: 1.0000 [drp] | Freq: Two times a day (BID) | OPHTHALMIC | Status: DC

## 2021-06-22 MED ORDER — VANCOMYCIN HCL IN DEXTROSE 1-5 GM/200ML-% IV SOLN
1000.0000 mg | Freq: Once | INTRAVENOUS | Status: AC
  Administered 2021-06-22: 1000 mg via INTRAVENOUS
  Filled 2021-06-22: qty 200

## 2021-06-22 MED ORDER — BRIMONIDINE TARTRATE 0.2 % OP SOLN
1.0000 [drp] | Freq: Two times a day (BID) | OPHTHALMIC | Status: DC
  Filled 2021-06-22: qty 5

## 2021-06-22 MED ORDER — PIPERACILLIN-TAZOBACTAM 3.375 G IVPB 30 MIN
3.3750 g | Freq: Once | INTRAVENOUS | Status: AC
  Administered 2021-06-22: 3.375 g via INTRAVENOUS
  Filled 2021-06-22: qty 50

## 2021-06-22 MED ORDER — IOHEXOL 300 MG/ML  SOLN
100.0000 mL | Freq: Once | INTRAMUSCULAR | Status: AC | PRN
  Administered 2021-06-22: 100 mL via INTRAVENOUS

## 2021-06-22 MED ORDER — TIMOLOL MALEATE 0.5 % OP SOLN
1.0000 [drp] | Freq: Two times a day (BID) | OPHTHALMIC | Status: DC
  Filled 2021-06-22: qty 5

## 2021-06-22 MED ORDER — ONDANSETRON HCL 4 MG PO TABS: 4.0000 mg | ORAL_TABLET | Freq: Four times a day (QID) | ORAL | Status: DC | PRN

## 2021-06-22 MED ORDER — ALBUTEROL SULFATE (2.5 MG/3ML) 0.083% IN NEBU: 2.5000 mg | INHALATION_SOLUTION | RESPIRATORY_TRACT | Status: DC | PRN

## 2021-06-22 MED ORDER — ACETAMINOPHEN 650 MG RE SUPP: 650.0000 mg | Freq: Four times a day (QID) | RECTAL | Status: DC | PRN

## 2021-06-22 MED ORDER — ACETAMINOPHEN 500 MG PO TABS
1000.0000 mg | ORAL_TABLET | Freq: Once | ORAL | Status: AC
  Administered 2021-06-22: 1000 mg via ORAL
  Filled 2021-06-22: qty 2

## 2021-06-22 NOTE — ED Triage Notes (Signed)
Left sided abd pain that started at 2300 last night, low grade fever of 100.3 noted at home.  Small yellow bruise to abd on left side, denies any injury.

## 2021-06-22 NOTE — ED Notes (Signed)
Attempted to obtain a second IV and draw 2nd blood culture that was not able to be obtained. IV was unsuccessful x1. Vanc was previously running.

## 2021-06-22 NOTE — H&P (Signed)
History and Physical    [name]:[id number] DOB: 1980-11-17 DOA: 06/22/2021  PCP: [name], MD  Patient coming from: Group home  I have personally briefly reviewed patient's old medical records in [facility]  Chief Complaint: Fever, left flank pain  HPI: Edward Alvarado is a 40 y.o. male with medical history significant for cerebral palsy who is wheelchair-bound, deafness, scoliosis, glaucoma, history of nephrolithiasis who presented to the ED for evaluation of fever and left flank/abdominal pain.  He is supplemented by patient's mother at bedside who also provides sign language interpretation.  Patient currently lives at a group home.  He is wheelchair-bound but can transfer to bed/toilet.  Yesterday he was noted to complain of left-sided abdominal/flank pain.  He appeared weaker than usual.  A small yellow appearing bruise was noted at the left lateral chest wall.  Patient denied any injury to the area or fall.  Today he was noted to have a fever of 100.4 F.  He was given Tylenol with improvement in symptoms.  Family called his PCP office who recommended he be seen in urgent care or ED to evaluate fever due to concern for underlying infection.  Patient denies any nausea, vomiting, chest pain.  He has had some shortness of breath but no cough.  He says his abdominal pain is improved.  He has not had any dysuria but was noted to have scant coffee colored urine when voiding in the ED.  Patient's mother does note that he had 3 episodes earlier this year where he suddenly began to hyper salivate and cough.  This was followed by slumping over with syncope/near syncope.  He had transient altered mental status.  His mother states that these episodes have all correlated shortly after mealtime.  Patient himself denies any choking on food or obvious aspiration.  His mother does state that he eats fast and unclear if he is chewing appropriately.  [facility] ED Course:   Initial vitals showed BP 138/85, pulse 112, RR 20, temp 99.3 F, SPO2 95% on room air.  T-max 102.3 F.  Labs show WBC 22.8, hemoglobin 15.3, platelets 222,000, sodium 135, potassium 3.8, bicarb 23, BUN 19, creatinine 0.97, serum glucose 139, lipase 25, lactic acid 1.4.  Urinalysis shows >80 ketones, 100 protein, negative nitrites, negative leukocytes, 0-5 WBC/hpf, >50 RBC/hpf, few bacteria on microscopy.  Blood cultures were ordered, 1 obtained second set not collected.  SARS-CoV-2 PCR is negative.  Influenza is negative.  CT abdomen/pelvis with contrast showed left lower lobe consolidation with peripherally enhancing fluid component measuring 3.4 cm with foci of internal air suspicious for lung abscess.  A 5 x 10 mm stone in the left proximal ureter with minimal proximal hydroureter but no hydronephrosis or obstructive changes also noted.  Nonobstructing stones in the lower right kidney also seen.  Portable chest x-ray showed left basilar airspace consolidation corresponding to airspace disease on CT.  Patient was given 2 L normal saline, IV vancomycin and Zosyn, morphine/Zofran/Tylenol.  The hospitalist service was consulted to admit for further evaluation and management.  Review of Systems: All systems reviewed and are negative except as documented in history of present illness above.   Past Medical History:  Diagnosis Date   Allergic rhinitis    Allergy    Atrophy, cortical    Lower left ventrical   Cerebral palsy (HCC)    W/C BOUND-ABLE TO TRANSFER W/C TO BED -DOES REQUIRE SOME HELP IN BATHROOM WITH CLOTHES--LIVES IN GROUP  HOME CALLED [name]-HIS PARENTS ARE HIS LEGAL GUARDIANS   Complication of anesthesia    JAN 2012 EYE SURGERY AT [facility] WAS USED AND PT EXPERIENCED AIRWAY / OXYGENATION PROBLEMS.  PT HAD SUBSEQUENT SURGERIES  AFTER JAN 2012 AT [facility] AND PARENTS TOLD ET USED AND PT DID FINE.  PT HAD NO ANESTHESIA PROBLEMS WITH SURGERIES PRIOR TO THE JAN 2012 SURGERY.    Deafness    hearing aids-NOT WEARING AT PRESENT TIME; ABLE TO DO SIGN LANGUAGE WITH HIS PARENTS   Glaucoma    Hyperlipidemia    Kidney stone    Platelets decreased (HCC)    Prepatellar bursitis    Hospitalized in 2012 for sepsis secondary to bursisiitis   Scoliosis     Past Surgical History:  Procedure Laterality Date   ADENOIDECTOMY     CYSTOSCOPY/RETROGRADE/URETEROSCOPY  10/01/2012   Procedure: CYSTOSCOPY/RETROGRADE/URETEROSCOPY;  Surgeon: Crecencio Mc, MD;  Location: [facility];  Service: Urology;  Laterality: Left;  laser lithotripsy stone extraction on left   eye surgery  2012   [facility]   EYE SURGERY     4 RIGHT EYE SURGERIES FOR GLAUCOMA AND ONE  RT EYE CATARACT EXTRACTION; LEFT EYE CATARACT EXTRACTION WITH  I STENTPROCEDURE FOR GLAUCOMA   HOLMIUM LASER APPLICATION  10/01/2012   Procedure: HOLMIUM LASER APPLICATION;  Surgeon: Crecencio Mc, MD;  Location: [facility];  Service: Urology;  Laterality: Left;   multiple orthopedic procedures     MYRINGOTOMY     SELECTIVE RHIZOTOMY PROCEDURE TO HELP REDUCE SPASTICITY AND CONTRACTURES      Social History:  reports that he has never smoked. He has never used smokeless tobacco. He reports that he does not drink alcohol and does not use drugs.  Allergies  Allergen Reactions   Iodine Anaphylaxis    REACTION: sensitivity, Patient has never had a reaction. Patient's mother is concerned that because she is allergic to iodine that the patient may be allergic to iodine. Family hx   Afrin [Oxymetazoline] Other (See Comments)    Has Glaucoma.  Cannot take this medication.   Betadine [Povidone Iodine]     Unsure     Family History  Problem Relation Age of Onset   Asthma Mother    Kidney Stones Mother    Osteoporosis Mother    Hypertension Mother    Hyperlipidemia Mother    Hyperlipidemia Father    Hodgkin's lymphoma Paternal Uncle    Alzheimer's disease Paternal Grandmother    Stroke Paternal Grandmother    Asthma Maternal Grandmother     Stroke Paternal Grandfather      Prior to Admission medications   Medication Sig Start Date End Date Taking? Authorizing Provider  benzonatate (TESSALON) 100 MG capsule Take 1 capsule (100 mg total) by mouth 2 (two) times daily as needed for cough. 01/13/21   [name], MD  carbamide peroxide (DEBROX) 6.5 % OTIC solution Place 5 drops into both ears once a week. 05/14/19   Caro Laroche, DO  cholecalciferol (VITAMIN D) 25 MCG (1000 UNIT) tablet TAKE 1 TABLET BY MOUTH ONCE DAILY 04/21/21   [name], MD  COMBIGAN 0.2-0.5 % ophthalmic solution INSTILL ONE DROP INTO EACH EYE 2 TIMES DAILY 04/21/21   [name], MD  fluticasone [facility]) 50 MCG/ACT nasal spray USE 2 SPRAYS in EACH NOSTRIL ONCE DAILY 04/21/21   [name], MD  methazolamide (NEPTAZANE) 50 MG tablet Take 1 tablet by mouth 2 (two) times daily. 03/30/21   [provider]  montelukast (SINGULAIR) 10 MG  tablet TAKE 1 TABLET BY MOUTH AT BEDTIME 04/21/21   [name], MD  multivitamin (ONE-A-DAY MEN'S) TABS tablet Take 1 tablet by mouth daily. 06/04/21   [name], MD  ondansetron (ZOFRAN) 4 MG tablet Take 1 tablet (4 mg total) by mouth every 8 (eight) hours as needed for nausea or vomiting. 04/06/21   Dana Allan, MD  Psyllium (METAMUCIL PREMIUM BLEND) 52.63 % POWD Take 1 Scoop by mouth daily. Take 1 tsp of metamucil in 4 oz of water daily to titrate to 1 bowel movement daily. If more than one BM daily, can increase to 2 tsp in 8 oz of water. If patient is sick, do not give. 07/28/20   [name], MD  Skin Protectants, Misc. (EUCERIN) cream Apply topically as needed (dry skin). Generic okay 05/14/19   Caro Laroche, DO  sodium chloride (OCEAN) 0.65 % SOLN nasal spray Place 1 spray into both nostrils as needed for congestion. As needed for allergy symptoms 12/05/17   Beaulah Dinning, MD    Physical Exam: Vitals:   06/22/21 1640 06/22/21 1858 06/22/21 2005 06/22/21 2109  BP: 126/86 128/86  126/64 106/62  Pulse: (!) 115 (!) 112 (!) 128 (!) 113  Resp: Temp:   (!) 102.3 F (39.1 C) 99.2 F (37.3 C)  TempSrc:   Oral Oral  SpO2: 95% 98% 97% 95%  Weight:      Height:       Constitutional: Resting in bed, NAD, calm, comfortable Eyes: PERRL, lids and conjunctivae normal ENMT: Mucous membranes are moist. Posterior pharynx clear of any exudate or lesions.Normal dentition.  Hearing impaired. Neck: normal, supple, no masses. Respiratory: clear to auscultation bilaterally, no wheezing, no crackles. Normal respiratory effort. No accessory muscle use.  Cardiovascular: Tachycardic with regular rhythm, no murmurs / rubs / gallops. No extremity edema. 2+ pedal pulses. Abdomen: no tenderness, no masses palpated. No hepatosplenomegaly.  Musculoskeletal: no clubbing / cyanosis.  Flexor posturing of right wrist,  thin lower extremities rotated to the left from the hips.  ROM lower extremities diminished, intact upper extremities. Skin: Slightly diaphoretic.  Small bruise with yellow discoloration left lateral chest wall approximately 3 x 1 cm.  No rashes, lesions, ulcers. No induration Neurologic: Sensation intact. Strength diminished bilateral lower extremities, intact upper extremities. Psychiatric: Awake, alert, pleasant.  Labs on Admission: I have personally reviewed following labs and imaging studies  CBC: Recent Labs  Lab 06/22/21 1627  WBC 22.8*  NEUTROABS 18.3*  HGB 15.3  HCT 44.3  MCV 91.0  PLT 222   Basic Metabolic Panel: Recent Labs  Lab 06/22/21 1627  NA 135  K 3.8  CL 99  CO2 23  GLUCOSE 139*  BUN 19  CREATININE 0.97  CALCIUM 9.3   GFR: Estimated Creatinine Clearance: 96.4 mL/min (by C-G formula based on SCr of 0.97 mg/dL). Liver Function Tests: Recent Labs  Lab 06/22/21 1627  AST 20  ALT 19  ALKPHOS 81  BILITOT 1.9*  PROT 8.1  ALBUMIN 4.0   Recent Labs  Lab 06/22/21 1627  LIPASE 25   No results for input(s): AMMONIA in the last  168 hours. Coagulation Profile: No results for input(s): INR, PROTIME in the last 168 hours. Cardiac Enzymes: No results for input(s): CKTOTAL, CKMB, CKMBINDEX, TROPONINI in the last 168 hours. BNP (last 3 results) No results for input(s): PROBNP in the last 8760 hours. HbA1C: No results for input(s): HGBA1C in the last 72 hours. CBG: No results for  input(s): GLUCAP in the last 168 hours. Lipid Profile: No results for input(s): CHOL, HDL, LDLCALC, TRIG, CHOLHDL, LDLDIRECT in the last 72 hours. Thyroid Function Tests: No results for input(s): TSH, T4TOTAL, FREET4, T3FREE, THYROIDAB in the last 72 hours. Anemia Panel: No results for input(s): VITAMINB12, FOLATE, FERRITIN, TIBC, IRON, RETICCTPCT in the last 72 hours. Urine analysis:    Component Value Date/Time   COLORURINE BROWN (A) 06/22/2021 1627   APPEARANCEUR CLEAR 06/22/2021 1627   LABSPEC >1.030 (H) 06/22/2021 1627   PHURINE 5.5 06/22/2021 1627   GLUCOSEU NEGATIVE 06/22/2021 1627   GLUCOSEU NEGATIVE 01/27/2011 0823   HGBUR LARGE (A) 06/22/2021 1627   HGBUR negative 06/28/2007 0851   BILIRUBINUR SMALL (A) 06/22/2021 1627   BILIRUBINUR NEG 05/20/2014 1142   KETONESUR >80 (A) 06/22/2021 1627   PROTEINUR 100 (A) 06/22/2021 1627   UROBILINOGEN 0.2 05/20/2014 1142   UROBILINOGEN 0.2 09/18/2012 2334   NITRITE NEGATIVE 06/22/2021 1627   LEUKOCYTESUR NEGATIVE 06/22/2021 1627    Radiological Exams on Admission: CT Abdomen Pelvis W Contrast  Result Date: 06/22/2021 CLINICAL DATA:  Fever and abdominal pain. EXAM: CT ABDOMEN AND PELVIS WITH CONTRAST TECHNIQUE: Multidetector CT imaging of the abdomen and pelvis was performed using the standard protocol following bolus administration of intravenous contrast. CONTRAST:  OMNIPAQUE IOHEXOL 300 MG/ML  SOLN COMPARISON:  Noncontrast CT 09/19/2012 FINDINGS: Lower chest: Small left pleural effusion. There is a left lower lobe consolidation measuring approximately 4.8 cm transverse.  Peripherally enhancing fluid component measuring 3.4 cm suspicious for lung abscess, with tiny bubbles of air. There is adjacent ground-glass and ill-defined opacity. No visualized pulmonary emboli in the lung bases. Hepatobiliary: No focal liver abnormality is seen. No gallstones, gallbladder wall thickening, or biliary dilatation. Pancreas: No ductal dilatation or inflammation. Spleen: Normal in size without focal abnormality.  Splenule medially Adrenals/Urinary Tract: No adrenal nodule. There is a 5 x 10 mm stone in the left proximal ureter (at the level of L3). Minimal proximal hydroureter but no hydronephrosis or obstructive changes. There is homogeneous enhancement and symmetric excretion on delayed phase imaging. Suspected nonobstructing stones in the lower right kidney are obscured by motion. Bilateral low-density lesions in both kidneys, larger lesions representing cysts, smaller lesions not well characterized due to motion and small size. Suspicious solid lesion. Minimally distended urinary bladder. No bladder wall thickening or stone. Stomach/Bowel: Lack of enteric contrast and motion limits detailed bowel assessment. Fluid within physiologically distended stomach. No small bowel obstruction or inflammation. Normal appendix. Air and small volume of stool in the colon. Sigmoid colon is redundant. No colonic inflammation. There is stool distending the rectum. Vascular/Lymphatic: Normal caliber abdominal aorta. Circumaortic left renal vein. Patent portal vein. No abdominopelvic adenopathy. Reproductive: Prostate is unremarkable. Other: Tiny fat containing umbilical hernia. No free air, free fluid, or intra-abdominal fluid collection. Musculoskeletal: There are no acute or suspicious osseous abnormalities. Dysplastic appearance of the right acetabulum. Postsurgical change in the right proximal femur. IMPRESSION: 1. There is a 5 x 10 mm stone in the left proximal ureter with minimal proximal hydroureter but no  hydronephrosis or obstructive changes. Nonobstructing stones in the lower right kidney. 2. Left lower lobe consolidation in the included lung bases with peripherally enhancing fluid component measuring 3.4 cm with foci of internal air suspicious for lung abscess. Small left pleural effusion. Possibility of necrotic neoplasm is also raised, but felt less likely. Recommend close radiographic follow-up after course of treatment to ensure resolution. 3. Bilateral renal cysts. Electronically Signed  By: Narda RutherfordMelanie  Sanford M.D.   On: 06/22/2021 18:22   DG Chest Port 1 View  Result Date: 06/22/2021 CLINICAL DATA:  Abnormality seen on CT abdomen. Patient reports left-sided abdominal pain. EXAM: PORTABLE CHEST 1 VIEW COMPARISON:  Lung bases from abdominopelvic CT earlier today. Chest radiograph 02/03/2021 FINDINGS: Left basilar airspace disease corresponds to consolidation with fluid collection on CT. Trace left pleural effusion. There is no other consolidation or focal airspace disease. No pneumothorax. Heart is normal in size with normal mediastinal contours. No pulmonary edema. No acute osseous abnormalities are seen. IMPRESSION: Left basilar airspace consolidation corresponds to airspace disease on CT. Trace left pleural effusion. Lungs are otherwise clear. Electronically Signed   By: Narda RutherfordMelanie  Sanford M.D.   On: 06/22/2021 19:11    EKG: Not performed.  Assessment/Plan Principal Problem:   Sepsis (HCC) Active Problems:   Lung abscess (HCC)   Left ureteral stone   Renard Hamperdward W Salonga is a [age] y.o. male with medical history significant for cerebral palsy who is wheelchair-bound, deafness, scoliosis, glaucoma, history of nephrolithiasis who is admitted with sepsis due to left lower lung abscess.  Sepsis due to left lower lung abscess: Patient presenting with fever, tachycardia, and leukocytosis.  CT with evidence of left lower lung abscess.  Unclear etiology, possible episodes of aspiration/silent  aspiration. -Start IV Unasyn 3 g every 6 hours -Continue IV fluid hydration overnight -Follow blood culture -SLP eval  Left proximal ureteral stone: Seen on CT imaging without obstruction.  Urine microscopy does show hematuria.  Cerebral palsy: Chronic and stable.  Wheelchair dependent but normally can transfer to bed/toilet.  DVT prophylaxis: Lovenox Code Status: Full code, confirmed with family Family Communication: Discussed with patient's mother at bedside Disposition Plan: From group home and likely return to group home pending clinical progress Consults called: None Level of care: Telemetry Admission status:  Status is: Inpatient  Remains inpatient appropriate because:IV treatments appropriate due to intensity of illness or inability to take PO and Inpatient level of care appropriate due to severity of illness  Dispo: The patient is from: Group home              Anticipated d/c is to: Group home              Patient currently is not medically stable to d/c.   Difficult to place patient No  Darreld McleanVishal Tremeka Helbling MD [facility]  If 7PM-7AM, please contact night-coverage [email or web address]  06/22/2021, 9:31 PM

## 2021-06-22 NOTE — ED Provider Notes (Signed)
[facility] EMERGENCY DEPARTMENT Provider Note   CSN: [id number] Arrival date & time: 06/22/21  1344     History Chief Complaint  Patient presents with   Abdominal Pain   Fever    Edward Alvarado is a 40 y.o. male with PMHx deafness, cerebral palsey, HLD, and kidney stone who presents to the ED today with mom as well as interpretor with complaints of abdominal pain.  Per mom they went to visit him at his group home today and was told that he was complaining of some abdominal pain.  They noticed a very small quarter sized bruise to his left abdomen.  Patient had denied falling in the group home did not mention him falling recently.  They checked his temperature at the group home and it was originally normal 98-99 however while there it increased to 100.4.  They gave him some Tylenol around 2 PM today and called his PCP who advised he come to the ED for further evaluation given complaint of abdominal pain and fever.  Mom denies any nausea or vomiting.  She states he last had a BM yesterday. She does mention that he had complaints of right sided abdominal pain in June however it resolved before they could have it evaluated.   The history is provided by the patient and medical records. The history is limited by a language barrier. Language interpreter used: Pt's personal ASL interpretor at bedside.      Past Medical History:  Diagnosis Date   Allergic rhinitis    Allergy    Atrophy, cortical    Lower left ventrical   Cerebral palsy (HCC)    W/C BOUND-ABLE TO TRANSFER W/C TO BED -DOES REQUIRE SOME HELP IN BATHROOM WITH CLOTHES--LIVES IN GROUP HOME CALLED [name]-HIS PARENTS ARE HIS LEGAL GUARDIANS   Complication of anesthesia    JAN 2012 EYE SURGERY AT [facility]--LMA WAS USED AND PT EXPERIENCED AIRWAY / OXYGENATION PROBLEMS.  PT HAD SUBSEQUENT SURGERIES  AFTER JAN 2012 AT [facility] AND PARENTS TOLD ET USED AND PT DID FINE.  PT HAD NO ANESTHESIA PROBLEMS WITH SURGERIES PRIOR TO THE JAN  2012 SURGERY.   Deafness    hearing aids-NOT WEARING AT PRESENT TIME; ABLE TO DO SIGN LANGUAGE WITH HIS PARENTS   Glaucoma    Hyperlipidemia    Kidney stone    Platelets decreased (HCC)    Prepatellar bursitis    Hospitalized in 2012 for sepsis secondary to bursisiitis   Scoliosis     Patient Active Problem List   Diagnosis Date Noted   Syncope and collapse 02/22/2021   Enlarged heart 12/29/2020   Watery stools 07/28/2020   Healthcare maintenance 07/28/2020   History of malnutrition 12/19/2019   Encounter for power mobility device assessment 05/24/2017   Wheelchair bound 05/24/2017   Vitamin D insufficiency 05/29/2015   Cerumen impaction 09/02/2013   Hyperlipidemia 07/18/2007   THROMBOCYTOPENIA NOS 06/26/2007   Cerebral palsy (HCC) 06/26/2007   OAG (open angle glaucoma), 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   LOSS, CONDUCTIVE HEARING, BILATERAL 06/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   Allergic rhinitis 06/26/2007   SCOLIOSIS 06/26/2007    Past Surgical History:  Procedure Laterality Date   ADENOIDECTOMY     CYSTOSCOPY/RETROGRADE/URETEROSCOPY  10/01/2012   Procedure: CYSTOSCOPY/RETROGRADE/URETEROSCOPY;  Surgeon: Crecencio McLes Borden, MD;  Location: [facility];  Service: Urology;  Laterality: Left;  laser lithotripsy stone extraction on left   eye surgery  2012   [facility]   EYE SURGERY     4 RIGHT EYE SURGERIES FOR GLAUCOMA AND ONE  RT EYE CATARACT EXTRACTION; LEFT EYE CATARACT EXTRACTION WITH  I STENTPROCEDURE FOR GLAUCOMA   HOLMIUM LASER APPLICATION  10/01/2012   Procedure: HOLMIUM LASER APPLICATION;  Surgeon: Crecencio Mc, MD;  Location: [facility];  Service: Urology;  Laterality: Left;   multiple orthopedic procedures     MYRINGOTOMY     SELECTIVE RHIZOTOMY PROCEDURE TO HELP REDUCE SPASTICITY AND CONTRACTURES         Family History  Problem Relation Age of Onset   Asthma Mother    Kidney Stones Mother    Osteoporosis Mother    Hypertension Mother    Hyperlipidemia Mother    Hyperlipidemia Father    Hodgkin's lymphoma  Paternal Uncle    Alzheimer's disease Paternal Grandmother    Stroke Paternal Grandmother    Asthma Maternal Grandmother    Stroke Paternal Grandfather     Social History   Tobacco Use   Smoking status: Never   Smokeless tobacco: Never  Substance Use Topics   Alcohol use: No   Drug use: No    Home Medications Prior to Admission medications   Medication Sig Start Date End Date Taking? Authorizing Provider  benzonatate (TESSALON) 100 MG capsule Take 1 capsule (100 mg total) by mouth 2 (two) times daily as needed for cough. 01/13/21   [name], MD  carbamide peroxide (DEBROX) 6.5 % OTIC solution Place 5 drops into both ears once a week. 05/14/19   Caro Laroche, DO  cholecalciferol (VITAMIN D) 25 MCG (1000 UNIT) tablet TAKE 1 TABLET BY MOUTH ONCE DAILY 04/21/21   [name], MD  COMBIGAN 0.2-0.5 % ophthalmic solution INSTILL ONE DROP INTO EACH EYE 2 TIMES DAILY 04/21/21   [name], MD  fluticasone [facility]) 50 MCG/ACT nasal spray USE 2 SPRAYS in EACH NOSTRIL ONCE DAILY 04/21/21   [name], MD  methazolamide (NEPTAZANE) 50 MG tablet Take 1 tablet by mouth 2 (two) times daily. 03/30/21   [provider]  montelukast (SINGULAIR) 10 MG tablet TAKE 1 TABLET BY MOUTH AT BEDTIME 04/21/21   [name], MD  multivitamin (ONE-A-DAY MEN'S) TABS tablet Take 1 tablet by mouth daily. 06/04/21   [name], MD  ondansetron (ZOFRAN) 4 MG tablet Take 1 tablet (4 mg total) by mouth every 8 (eight) hours as needed for nausea or vomiting. 04/06/21   Dana Allan, MD  Psyllium (METAMUCIL PREMIUM BLEND) 52.63 % POWD Take 1 Scoop by mouth daily. Take 1 tsp of metamucil in 4 oz of water daily to titrate to 1 bowel movement daily. If more than one BM daily, can increase to 2 tsp in 8 oz of water. If patient is sick, do not give. 07/28/20   [name], MD  Skin Protectants, Misc. (EUCERIN) cream Apply topically as needed (dry skin). Generic okay 05/14/19   Caro Laroche, DO  sodium chloride (OCEAN) 0.65 % SOLN nasal spray Place 1 spray into both nostrils as needed for congestion. As needed for allergy symptoms 12/05/17   Beaulah Dinning, MD    Allergies    Iodine, Afrin [oxymetazoline], and Betadine [povidone iodine]  Review of Systems   Review of Systems  All other systems reviewed and are negative.  Physical Exam Updated Vital Signs BP 128/86 (BP Location: Right Arm)   Pulse (!) 112   Temp 99.3 F (37.4 C) (Oral)   Resp 18   Ht 5\' 9"  (1.753 m)   Wt 68 kg   SpO2 98%   BMI 22.15 kg/m   Physical  Exam Vitals and nursing note reviewed.  Constitutional:      Appearance: He is not ill-appearing or diaphoretic.  HENT:     Head: Normocephalic and atraumatic.  Eyes:     Conjunctiva/sclera: Conjunctivae normal.  Cardiovascular:     Rate and Rhythm: Normal rate and regular rhythm.     Heart sounds: Normal heart sounds.  Pulmonary:     Effort: Pulmonary effort is normal.     Breath sounds: Normal breath sounds.  Abdominal:     General: Abdomen is flat.     Palpations: Abdomen is soft.     Tenderness: There is no abdominal tenderness. There is no guarding or rebound.     Comments: Small quarter sized yellowish/old bruise noted to left abdomen/flank without TTP  Musculoskeletal:     Cervical back: Neck supple.  Skin:    General: Skin is warm and dry.  Neurological:     Mental Status: He is alert.    ED Results / Procedures / Treatments   Labs (all labs ordered are listed, but only abnormal results are displayed) Labs Reviewed  CBC WITH DIFFERENTIAL/PLATELET - Abnormal; Notable for the following components:      Result Value   WBC 22.8 (*)    Neutro Abs 18.3 (*)    Monocytes Absolute 2.5 (*)    Abs Immature Granulocytes 0.14 (*)    All other components within normal limits  COMPREHENSIVE METABOLIC PANEL - Abnormal; Notable for the following components:   Glucose, Bld 139 (*)    Total Bilirubin 1.9 (*)    All other  components within normal limits  URINALYSIS, ROUTINE W REFLEX MICROSCOPIC - Abnormal; Notable for the following components:   Color, Urine BROWN (*)    Specific Gravity, Urine >1.030 (*)    Hgb urine dipstick LARGE (*)    Bilirubin Urine SMALL (*)    Ketones, ur >80 (*)    Protein, ur 100 (*)    All other components within normal limits  URINALYSIS, MICROSCOPIC (REFLEX) - Abnormal; Notable for the following components:   Bacteria, UA FEW (*)    All other components within normal limits  RESP PANEL BY RT-PCR (FLU A&B, COVID) ARPGX2  CULTURE, BLOOD (ROUTINE X 2)  CULTURE, BLOOD (ROUTINE X 2)  LIPASE, BLOOD  LACTIC ACID, PLASMA  LACTIC ACID, PLASMA    EKG None  Radiology CT Abdomen Pelvis W Contrast  Result Date: 06/22/2021 CLINICAL DATA:  Fever and abdominal pain. EXAM: CT ABDOMEN AND PELVIS WITH CONTRAST TECHNIQUE: Multidetector CT imaging of the abdomen and pelvis was performed using the standard protocol following bolus administration of intravenous contrast. CONTRAST:  OMNIPAQUE IOHEXOL 300 MG/ML  SOLN COMPARISON:  Noncontrast CT 09/19/2012 FINDINGS: Lower chest: Small left pleural effusion. There is a left lower lobe consolidation measuring approximately 4.8 cm transverse. Peripherally enhancing fluid component measuring 3.4 cm suspicious for lung abscess, with tiny bubbles of air. There is adjacent ground-glass and ill-defined opacity. No visualized pulmonary emboli in the lung bases. Hepatobiliary: No focal liver abnormality is seen. No gallstones, gallbladder wall thickening, or biliary dilatation. Pancreas: No ductal dilatation or inflammation. Spleen: Normal in size without focal abnormality.  Splenule medially Adrenals/Urinary Tract: No adrenal nodule. There is a 5 x 10 mm stone in the left proximal ureter (at the level of L3). Minimal proximal hydroureter but no hydronephrosis or obstructive changes. There is homogeneous enhancement and symmetric excretion on delayed phase  imaging. Suspected nonobstructing stones in the lower right kidney are obscured  by motion. Bilateral low-density lesions in both kidneys, larger lesions representing cysts, smaller lesions not well characterized due to motion and small size. Suspicious solid lesion. Minimally distended urinary bladder. No bladder wall thickening or stone. Stomach/Bowel: Lack of enteric contrast and motion limits detailed bowel assessment. Fluid within physiologically distended stomach. No small bowel obstruction or inflammation. Normal appendix. Air and small volume of stool in the colon. Sigmoid colon is redundant. No colonic inflammation. There is stool distending the rectum. Vascular/Lymphatic: Normal caliber abdominal aorta. Circumaortic left renal vein. Patent portal vein. No abdominopelvic adenopathy. Reproductive: Prostate is unremarkable. Other: Tiny fat containing umbilical hernia. No free air, free fluid, or intra-abdominal fluid collection. Musculoskeletal: There are no acute or suspicious osseous abnormalities. Dysplastic appearance of the right acetabulum. Postsurgical change in the right proximal femur. IMPRESSION: 1. There is a 5 x 10 mm stone in the left proximal ureter with minimal proximal hydroureter but no hydronephrosis or obstructive changes. Nonobstructing stones in the lower right kidney. 2. Left lower lobe consolidation in the included lung bases with peripherally enhancing fluid component measuring 3.4 cm with foci of internal air suspicious for lung abscess. Small left pleural effusion. Possibility of necrotic neoplasm is also raised, but felt less likely. Recommend close radiographic follow-up after course of treatment to ensure resolution. 3. Bilateral renal cysts. Electronically Signed   By: Narda Rutherford M.D.   On: 06/22/2021 18:22    Procedures .Critical Care  Date/Time: 06/22/2021 7:13 PM Performed by: Tanda Rockers, PA-C Authorized by: Tanda Rockers, PA-C   Critical care provider  statement:    Critical care time (minutes):  45   Critical care was necessary to treat or prevent imminent or life-threatening deterioration of the following conditions:  Sepsis   Critical care was time spent personally by me on the following activities:  Discussions with consultants, evaluation of patient's response to treatment, examination of patient, ordering and performing treatments and interventions, ordering and review of laboratory studies, ordering and review of radiographic studies, pulse oximetry, re-evaluation of patient's condition, obtaining history from patient or surrogate and review of old charts   Medications Ordered in ED Medications  vancomycin (VANCOCIN) IVPB 1000 mg/200 mL premix (1,000 mg Intravenous New Bag/Given 06/22/21 1851)  piperacillin-tazobactam (ZOSYN) IVPB 3.375 g (has no administration in time range)  sodium chloride 0.9 % bolus 1,000 mL (has no administration in time range)  sodium chloride 0.9 % bolus 1,000 mL (1,000 mLs Intravenous New Bag/Given 06/22/21 1701)  iohexol (OMNIPAQUE) 300 MG/ML solution 100 mL (100 mLs Intravenous Contrast Given 06/22/21 1740)    ED Course  I have reviewed the triage vital signs and the nursing notes.  Pertinent labs & imaging results that were available during my care of the patient were reviewed by me and considered in my medical decision making (see chart for details).    MDM Rules/Calculators/A&P                           40 year old male who presents to the ED today with complaint of abdominal pain as well as fever that began today.  Last received Tylenol around 2 PM today.  On arrival his temperature is 99.3.  He is tachycardic in the 110s, nontachypneic.  He is deaf and has an ASL interpreter at bedside with him as well as his mom.  He has no obvious abdominal tenderness palpation on exam.  He states that it is improved at this time.  He states  it waxes and wanes.  Urine is at bedside, very dark in color.  Mom does report  history of kidney stones.  Question of this could be causing symptoms.  Feel patient will likely need a CT scan given his complaint of abdominal pain and fever.  We will plan for labs, fluids, CT scan and reevaluate.  CBC has returned with a leukocytosis of 22,800 with left shift.  Blood cultures obtained at this time.  Lactic acid added.  WBC  Date Value Ref Range Status  06/22/2021 22.8 (H) 4.0 - 10.5 K/uL Final  02/03/2021 13.3 (H) 4.0 - 10.5 K/uL Final  11/23/2018 7.4 4.0 - 10.5 K/uL Final  05/16/2017 6.4 3.4 - 10.8 x10E3/uL Final  05/28/2015 7.1 4.0 - 10.5 K/uL Final   CMP with a glucose of 139 and T bili of 1.9.  Remainder of LFTs unremarkable.  No other electrolyte abnormalities. Lipase 25. Lactic acid within normal limits at 1.4.  Urinalysis brown in color with increased Pacific gravity.  Large hemoglobin per disc stick, > 50 blood cells per high-power field with small bilirubin, greater than 80 ketones, protein.  No leuks or nitrites.  No white blood cells.  Few bacteria.  CT:   IMPRESSION:  1. There is a 5 x 10 mm stone in the left proximal ureter with  minimal proximal hydroureter but no hydronephrosis or obstructive  changes. Nonobstructing stones in the lower right kidney.  2. Left lower lobe consolidation in the included lung bases with  peripherally enhancing fluid component measuring 3.4 cm with foci of  internal air suspicious for lung abscess. Small left pleural  effusion. Possibility of necrotic neoplasm is also raised, but felt  less likely. Recommend close radiographic follow-up after course of  treatment to ensure resolution.  3. Bilateral renal cysts.   Will add on CXR at this time for further visualization. U/A without signs of infection; will cover for lung abscess with zosyn and vanc. Pt will require admission at this time.    Discussed case with [facility] Dr. Arville Care who agrees to accept patient for admission.   This note was prepared using Dragon  voice recognition software and may include unintentional dictation errors due to the inherent limitations of voice recognition software.   Final Clinical Impression(s) / ED Diagnoses Final diagnoses:  Abscess of lower lobe of left lung with pneumonia (HCC)  Kidney stone  Sepsis without acute organ dysfunction, due to unspecified organism [facility])    Rx / DC Orders ED Discharge Orders     None        Tanda Rockers, PA-C 06/22/21 1913    Jacalyn Lefevre, MD 06/22/21 2309

## 2021-06-22 NOTE — Telephone Encounter (Signed)
I agree with documentation that patient should be seen in UC for fever. Will follow up in clinic next week.  [name], MD [facility], PGY-3

## 2021-06-22 NOTE — Telephone Encounter (Signed)
Patient's mother calls nurse line regarding pain and discomfort in left side. Patient is experiencing pain with movement and transfers. Mother also reports the development of low grade fever. Originally 99.0, however in the last hour has went up to 100.4.   Mother is requesting advice on if patient should wait to be seen in clinic next week or if she should take him to UC. Recommended urgent care due to increased pain and fever.   Veronda Prude, RN

## 2021-06-22 NOTE — ED Notes (Signed)
[facility] at bedside to transport patient to [facility]. Report previously called to [place], [employer] fundraiser. Patient medicated prior to transport with Tylenol, Zofran, and morphine. MD re-assessed prior to transport. Mother aware of plan of care.

## 2021-06-23 ENCOUNTER — Inpatient Hospital Stay (HOSPITAL_COMMUNITY): Payer: Medicare Other

## 2021-06-23 LAB — CBC
HCT: 35 % — ABNORMAL LOW (ref 39.0–52.0)
Hemoglobin: 12.2 g/dL — ABNORMAL LOW (ref 13.0–17.0)
MCH: 31.9 pg (ref 26.0–34.0)
MCHC: 34.9 g/dL (ref 30.0–36.0)
MCV: 91.6 fL (ref 80.0–100.0)
Platelets: 150 10*3/uL (ref 150–400)
RBC: 3.82 MIL/uL — ABNORMAL LOW (ref 4.22–5.81)
RDW: 12 % (ref 11.5–15.5)
WBC: 14.1 10*3/uL — ABNORMAL HIGH (ref 4.0–10.5)
nRBC: 0 % (ref 0.0–0.2)

## 2021-06-23 LAB — BASIC METABOLIC PANEL
Anion gap: 8 (ref 5–15)
BUN: 13 mg/dL (ref 6–20)
CO2: 22 mmol/L (ref 22–32)
Calcium: 8.3 mg/dL — ABNORMAL LOW (ref 8.9–10.3)
Chloride: 105 mmol/L (ref 98–111)
Creatinine, Ser: 0.74 mg/dL (ref 0.61–1.24)
GFR, Estimated: >60 mL/min (ref >60)
Glucose, Bld: 96 mg/dL (ref 70–99)
Potassium: 3.7 mmol/L (ref 3.5–5.1)
Sodium: 135 mmol/L (ref 135–145)

## 2021-06-23 LAB — PROCALCITONIN: Procalcitonin: 0.37 ng/mL

## 2021-06-23 LAB — STREP PNEUMONIAE URINARY ANTIGEN: Strep Pneumo Urinary Antigen: NEGATIVE

## 2021-06-23 LAB — MRSA NEXT GEN BY PCR, NASAL: MRSA by PCR Next Gen: NOT DETECTED

## 2021-06-23 LAB — HIV ANTIBODY (ROUTINE TESTING W REFLEX): HIV Screen 4th Generation wRfx: NONREACTIVE

## 2021-06-23 LAB — MAGNESIUM: Magnesium: 1.7 mg/dL (ref 1.7–2.4)

## 2021-06-23 MED ORDER — POLYETHYLENE GLYCOL 3350 17 G PO PACK
17.0000 g | PACK | Freq: Every day | ORAL | Status: DC
  Administered 2021-06-23: 17 g via ORAL
  Filled 2021-06-23 (×2): qty 1

## 2021-06-23 MED ORDER — PANTOPRAZOLE SODIUM 40 MG IV SOLR
40.0000 mg | Freq: Two times a day (BID) | INTRAVENOUS | Status: DC
  Administered 2021-06-23 – 2021-06-25 (×4): 40 mg via INTRAVENOUS
  Filled 2021-06-23 (×4): qty 40

## 2021-06-23 MED ORDER — HYDROCODONE BIT-HOMATROP MBR 5-1.5 MG/5ML PO SOLN
5.0000 mL | Freq: Four times a day (QID) | ORAL | Status: DC | PRN
  Administered 2021-06-23 – 2021-06-24 (×2): 5 mL via ORAL
  Filled 2021-06-23 (×2): qty 5

## 2021-06-23 MED ORDER — ADULT MULTIVITAMIN LIQUID CH
15.0000 mL | Freq: Every day | ORAL | Status: DC
  Administered 2021-06-24 – 2021-06-26 (×2): 15 mL via ORAL
  Filled 2021-06-23 (×4): qty 15

## 2021-06-23 MED ORDER — SORBITOL 70 % SOLN
200.0000 mL | TOPICAL_OIL | Freq: Once | ORAL | Status: DC
  Filled 2021-06-23: qty 60

## 2021-06-23 MED ORDER — ENSURE ENLIVE PO LIQD
237.0000 mL | Freq: Three times a day (TID) | ORAL | Status: DC
  Administered 2021-06-23: 237 mL via ORAL

## 2021-06-23 MED ORDER — BISACODYL 5 MG PO TBEC
10.0000 mg | DELAYED_RELEASE_TABLET | Freq: Every day | ORAL | Status: DC
  Filled 2021-06-23 (×3): qty 2

## 2021-06-23 MED ORDER — IOHEXOL 350 MG/ML SOLN
60.0000 mL | Freq: Once | INTRAVENOUS | Status: AC | PRN
  Administered 2021-06-23: 60 mL via INTRAVENOUS

## 2021-06-23 MED ORDER — LACTATED RINGERS IV SOLN: INTRAVENOUS | Status: DC

## 2021-06-23 MED ORDER — BRIMONIDINE TARTRATE-TIMOLOL 0.2-0.5 % OP SOLN
1.0000 [drp] | Freq: Two times a day (BID) | OPHTHALMIC | Status: DC
  Administered 2021-06-23 – 2021-06-26 (×7): 1 [drp] via OPHTHALMIC

## 2021-06-23 MED ORDER — BISACODYL 10 MG RE SUPP
10.0000 mg | Freq: Once | RECTAL | Status: AC
  Administered 2021-06-23: 10 mg via RECTAL
  Filled 2021-06-23 (×2): qty 1

## 2021-06-23 NOTE — Progress Notes (Signed)
   06/23/21 0752  Charting Type  Charting Type Shift assessment  Assessment of needs addressed Yes  Orders Chart Check (once per shift) Completed  [facility] Work Intensity Score (Update with each assessment and as needed)  Work Intensity Score (Level) 3  Level 3 Intensity B.Frequent assistance requests (patient or family);D.Complex communication/cognitive functioning/emotional needs impeding care;F.Total dependent care with all ADL's & mobility  Neurological  Neuro (WDL) WDL  NuDESC - Delirium Risk Factor Assessment (Complete for non-ICU patients)  Delirium Risk Factor Assessment Severe illness / infection  NuDESC - Nursing Delirium Screening Scale (Complete for non-ICU patients)  Disorientation 0  Inappropriate Behavior 0  Inappropriate Communications 0  Illusions/hallucinations 0  Psychomotor Retardation 0  NuDESC Total Score 0  NuDESC - Delirium Prevention:  Universal Requirements (Complete for all non-ICU patients with a delirium risk factor)  Universal Precautions Initiated *See Row Information* Yes  HEENT  HEENT (WDL) X  Vision Check No  R Eye Impaired vision  L Eye Impaired vision  R Ear Deaf  L Ear Deaf  Respiratory  Respiratory (WDL) X  Cough Non-productive  Bilateral Breath Sounds Clear;Diminished  Respiratory Pattern Regular;Unlabored  Chest Assessment Chest expansion symmetrical  Cardiac  Cardiac (WDL) X  Pulse Regular  ECG Monitor Yes  Vascular  Vascular (WDL) WDL  Integumentary  Integumentary (WDL) X  Skin Color Appropriate for ethnicity  Skin Condition Dry  Skin Integrity Abrasion;Ecchymosis  Abrasion Location Elbow;Knee  Abrasion Location Orientation Left;Bilateral  Braden Scale (Ages 8 and up)  Sensory Perceptions 3  Moisture 3  Activity 2  Mobility 2  Nutrition 3  Friction and Shear 1  Braden Scale Score 14  Musculoskeletal  Musculoskeletal (WDL) X  Assistive Device None  Generalized Weakness Yes  Weight Bearing Restrictions Yes   Gastrointestinal  Gastrointestinal (WDL) X  Abdomen Inspection Soft  Bowel Sounds Assessment Active  Last BM Date 06/20/21  Passing Flatus No  GU Assessment  Genitourinary (WDL) WDL  Genitalia  Male Genitalia Intact  Urine Characteristics  Urinary Incontinence No  Urine Color Yellow/straw  Psychosocial  Psychosocial (WDL) WDL  Neurological  Level of Consciousness Alert    New RN taking over care agrees with prior RN's assessment. Will continue care.

## 2021-06-23 NOTE — Plan of Care (Signed)
  Problem: Education: Goal: Knowledge of General Education information will improve Description: Including pain rating scale, medication(s)/side effects and non-pharmacologic comfort measures Outcome: Progressing   Problem: Coping: Goal: Level of anxiety will decrease Outcome: Progressing   Problem: Elimination: Goal: Will not experience complications related to urinary retention Outcome: Progressing   Problem: Pain Managment: Goal: General experience of comfort will improve Outcome: Progressing   Problem: Safety: Goal: Ability to remain free from injury will improve Outcome: Progressing   Problem: Skin Integrity: Goal: Risk for impaired skin integrity will decrease Outcome: Progressing   Problem: Respiratory: Goal: Ability to maintain adequate ventilation will improve Outcome: Progressing   

## 2021-06-23 NOTE — Progress Notes (Signed)
Patient had some output during the evening shift, but night shift nurse passed along to day shift nurse to continue to monitor patient's output. May need to bladder scan patient.

## 2021-06-23 NOTE — Progress Notes (Signed)
PROGRESS NOTE    Edward Alvarado  ZDG:[id number] DOB: Dec 28, 1980 DOA: 06/22/2021 PCP: Gladys Damme, MD  Brief Narrative: 40 yo male with a history of cerebral palsy wheelchair-bound complicated by deafness glaucoma scoliosis admitted with fever of 102.3 [facility] found to have left lower lobe infiltrates concerning for abscess, leukocytosis, dehydration UA with ketonuria.  COVID-negative influenza negative. CT abdomen and pelvis-left lower lobe consolidation with peripherally enhancing fluid component measuring 3.4 cm with foci of internal air suspicious for lung abscess.  A 5 x 10 mm stone in the left proximal ureter with minimal proximal hydroureter but no hydronephrosis or obstructive changes also noted.  Nonobstructing stones in the lower right kidney also seen.   Portable chest x-ray showed left basilar airspace consolidation corresponding to airspace disease on CT. Assessment & Plan:   Principal Problem:   Sepsis ([place]) Active Problems:   Lung abscess ([place])   Left ureteral stone  #1 sepsis patient met criteria for sepsis on admission with fever 102.3 tachycardia and leukocytosis.  Sepsis likely secondary to left lower lung consolidation concern for silent aspiration. Will get speech therapy to evaluate him.  Patient has had episodes of hyper salivating and coughing and passing out in the past. Continue Unasyn. Leukocytosis improving Fever curve trending down  #2 left ureteral stone with no obstruction.  Follow-up with urology as outpatient.  No evidence of hydronephrosis.  #3 constipation stool softeners ordered.  #4 history of cerebral palsy wheelchair-bound lives in group home.        Nutrition Problem: Inadequate oral intake Etiology: decreased appetite, acute illness (sepsis)     Signs/Symptoms: per patient/family report, meal completion < 25%    Interventions: Ensure Enlive (each supplement provides 350kcal and 20 grams of protein), Hormel Shake,  MVI  Estimated body mass index is 22.15 kg/m as calculated from the following:   Height as of this encounter: 5' 9" (1.753 m).   Weight as of this encounter: 68 kg.  DVT prophylaxis: Lovenox Code Status: Full code Family Communication: Discussed with father in the room Disposition Plan:  Status is: Inpatient  Remains inpatient appropriate because:IV treatments appropriate due to intensity of illness or inability to take PO  Dispo: The patient is from: Group home              Anticipated d/c is to: Group home              Patient currently is not medically stable to d/c.   Difficult to place patient No       Consultants:  None  Procedures: None Antimicrobials Unasyn Subjective: He is resting in bed father by the bedside he is nonverbal and deaf however when I touched him he opened his eyes and smiled I showed him my stethoscope and I told him I am going to listen to him he noted his head yes  Objective: Vitals:   06/23/21 0121 06/23/21 0543 06/23/21 0846 06/23/21 1109  BP: 97/71 105/66 (!) 96/58 95/67  Pulse: 85 (!) 109 99 93  Resp: 15 16 (!) 24 (!) 24  Temp: 98.5 F (36.9 C) 98.9 F (37.2 C) 99.3 F (37.4 C) 98.8 F (37.1 C)  TempSrc: Oral Oral Axillary Axillary  SpO2: 99% 95% 95% 95%  Weight:      Height:        Intake/Output Summary (Last 24 hours) at 06/23/2021 1309 Last data filed at 06/23/2021 1238 Gross per 24 hour  Intake 1470.87 ml  Output 600 ml  Net 870.87 ml   Filed Weights   06/22/21 1409  Weight: 68 kg    Examination:  General exam: Appears calm and comfortable  Respiratory system: Clear to auscultation. Respiratory effort normal. Cardiovascular system: S1 & S2 heard, RRR. No JVD, murmurs, rubs, gallops or clicks. No pedal edema. Gastrointestinal system: Abdomen is nondistended, soft and nontender. No organomegaly or masses felt. Normal bowel sounds heard. Central nervous system: Alert and oriented. No focal neurological  deficits. Extremities: Symmetric 5 x 5 power. Skin: No rashes, lesions or ulcers Psychiatry: Judgement and insight appear normal. Mood & affect appropriate.     Data Reviewed: I have personally reviewed following labs and imaging studies  CBC: Recent Labs  Lab 06/22/21 1627 06/23/21 0459  WBC 22.8* 14.1*  NEUTROABS 18.3*  --   HGB 15.3 12.2*  HCT 44.3 35.0*  MCV 91.0 91.6  PLT 222 440   Basic Metabolic Panel: Recent Labs  Lab 06/22/21 1627 06/23/21 0459  NA 135 135  K 3.8 3.7  CL 99 105  CO2 23 22  GLUCOSE 139* 96  BUN 19 13  CREATININE 0.97 0.74  CALCIUM 9.3 8.3*   GFR: Estimated Creatinine Clearance: 116.9 mL/min (by C-G formula based on SCr of 0.74 mg/dL). Liver Function Tests: Recent Labs  Lab 06/22/21 1627  AST 20  ALT 19  ALKPHOS 81  BILITOT 1.9*  PROT 8.1  ALBUMIN 4.0   Recent Labs  Lab 06/22/21 1627  LIPASE 25   No results for input(s): AMMONIA in the last 168 hours. Coagulation Profile: No results for input(s): INR, PROTIME in the last 168 hours. Cardiac Enzymes: No results for input(s): CKTOTAL, CKMB, CKMBINDEX, TROPONINI in the last 168 hours. BNP (last 3 results) No results for input(s): PROBNP in the last 8760 hours. HbA1C: No results for input(s): HGBA1C in the last 72 hours. CBG: No results for input(s): GLUCAP in the last 168 hours. Lipid Profile: No results for input(s): CHOL, HDL, LDLCALC, TRIG, CHOLHDL, LDLDIRECT in the last 72 hours. Thyroid Function Tests: No results for input(s): TSH, T4TOTAL, FREET4, T3FREE, THYROIDAB in the last 72 hours. Anemia Panel: No results for input(s): VITAMINB12, FOLATE, FERRITIN, TIBC, IRON, RETICCTPCT in the last 72 hours. Sepsis Labs: Recent Labs  Lab 06/22/21 1657 06/23/21 0459  PROCALCITON  --  0.37  LATICACIDVEN 1.4  --     Recent Results (from the past 240 hour(s))  Culture, blood (routine x 2)     Status: None (Preliminary result)   Collection Time: 06/22/21  4:57 PM   Specimen:  Left Antecubital; Blood  Result Value Ref Range Status   Specimen Description   Final    LEFT ANTECUBITAL Performed at [facility], [address] [place], [place] [address]    Special Requests   Final    BOTTLES DRAWN AEROBIC AND ANAEROBIC Blood Culture adequate volume Performed at [facility], [address] [place], [place] [address]    Culture   Final    NO GROWTH < 12 HOURS Performed at [facility], [address], [place], [place] [address]    Report Status PENDING  Incomplete  Resp Panel by RT-PCR (Flu A&B, Covid) Nasopharyngeal Swab     Status: None   Collection Time: 06/22/21  6:18 PM   Specimen: Nasopharyngeal Swab; Nasopharyngeal(NP) swabs in vial transport medium  Result Value Ref Range Status   SARS Coronavirus 2 by RT PCR NEGATIVE NEGATIVE Final    Comment: (NOTE)  SARS-CoV-2 target nucleic acids are NOT DETECTED.  The SARS-CoV-2 RNA is generally detectable in upper respiratory specimens during the acute phase of infection. The lowest concentration of SARS-CoV-2 viral copies this assay can detect is 138 copies/mL. A negative result does not preclude SARS-Cov-2 infection and should not be used as the sole basis for treatment or other patient management decisions. A negative result may occur with  improper specimen collection/handling, submission of specimen other than nasopharyngeal swab, presence of viral mutation(s) within the areas targeted by this assay, and inadequate number of viral copies(<138 copies/mL). A negative result must be combined with clinical observations, patient history, and epidemiological information. The expected result is Negative.  Fact Sheet for Patients:  https://www.fda.gov/media/152166/download  Fact Sheet for Healthcare Providers:  https://www.fda.gov/media/152162/download  This test is no t yet approved or cleared by the [place] FDA and  has been authorized for detection and/or diagnosis of  SARS-CoV-2 by FDA under an Emergency Use Authorization (EUA). This EUA will remain  in effect (meaning this test can be used) for the duration of the COVID-19 declaration under Section 564(b)(1) of the Act, 21 U.S.C.section 360bbb-3(b)(1), unless the authorization is terminated  or revoked sooner.       Influenza A by PCR NEGATIVE NEGATIVE Final   Influenza B by PCR NEGATIVE NEGATIVE Final    Comment: (NOTE) The Xpert Xpress SARS-CoV-2/FLU/RSV plus assay is intended as an aid in the diagnosis of influenza from Nasopharyngeal swab specimens and should not be used as a sole basis for treatment. Nasal washings and aspirates are unacceptable for Xpert Xpress SARS-CoV-2/FLU/RSV testing.  Fact Sheet for Patients: https://www.fda.gov/media/152166/download  Fact Sheet for Healthcare Providers: https://www.fda.gov/media/152162/download  This test is not yet approved or cleared by the [place] FDA and has been authorized for detection and/or diagnosis of SARS-CoV-2 by FDA under an Emergency Use Authorization (EUA). This EUA will remain in effect (meaning this test can be used) for the duration of the COVID-19 declaration under Section 564(b)(1) of the Act, 21 U.S.C. section 360bbb-3(b)(1), unless the authorization is terminated or revoked.  Performed at [facility] [place], [address] [place], [place] [address]   MRSA Next Gen by PCR, Nasal     Status: None   Collection Time: 06/23/21  6:48 AM   Specimen: Nasal Mucosa; Nasal Swab  Result Value Ref Range Status   MRSA by PCR Next Gen NOT DETECTED NOT DETECTED Final    Comment: (NOTE) The GeneXpert MRSA Assay (FDA approved for NASAL specimens only), is one component of a comprehensive MRSA colonization surveillance program. It is not intended to diagnose MRSA infection nor to guide or monitor treatment for MRSA infections. Test performance is not FDA approved in patients less than 2 years old. Performed at [facility], [address] [place], [place] [address]          Radiology Studies: CT Abdomen Pelvis W Contrast  Result Date: 06/22/2021 CLINICAL DATA:  Fever and abdominal pain. EXAM: CT ABDOMEN AND PELVIS WITH CONTRAST TECHNIQUE: Multidetector CT imaging of the abdomen and pelvis was performed using the standard protocol following bolus administration of intravenous contrast. CONTRAST:  100mL OMNIPAQUE IOHEXOL 300 MG/ML  SOLN COMPARISON:  Noncontrast CT 09/19/2012 FINDINGS: Lower chest: Small left pleural effusion. There is a left lower lobe consolidation measuring approximately 4.8 cm transverse. Peripherally enhancing fluid component measuring 3.4 cm suspicious for lung abscess, with tiny bubbles of air. There is adjacent ground-glass and ill-defined opacity. No visualized pulmonary emboli   in the lung bases. Hepatobiliary: No focal liver abnormality is seen. No gallstones, gallbladder wall thickening, or biliary dilatation. Pancreas: No ductal dilatation or inflammation. Spleen: Normal in size without focal abnormality.  Splenule medially Adrenals/Urinary Tract: No adrenal nodule. There is a 5 x 10 mm stone in the left proximal ureter (at the level of L3). Minimal proximal hydroureter but no hydronephrosis or obstructive changes. There is homogeneous enhancement and symmetric excretion on delayed phase imaging. Suspected nonobstructing stones in the lower right kidney are obscured by motion. Bilateral low-density lesions in both kidneys, larger lesions representing cysts, smaller lesions not well characterized due to motion and small size. Suspicious solid lesion. Minimally distended urinary bladder. No bladder wall thickening or stone. Stomach/Bowel: Lack of enteric contrast and motion limits detailed bowel assessment. Fluid within physiologically distended stomach. No small bowel obstruction or inflammation. Normal appendix. Air and small volume of stool in the colon. Sigmoid colon is  redundant. No colonic inflammation. There is stool distending the rectum. Vascular/Lymphatic: Normal caliber abdominal aorta. Circumaortic left renal vein. Patent portal vein. No abdominopelvic adenopathy. Reproductive: Prostate is unremarkable. Other: Tiny fat containing umbilical hernia. No free air, free fluid, or intra-abdominal fluid collection. Musculoskeletal: There are no acute or suspicious osseous abnormalities. Dysplastic appearance of the right acetabulum. Postsurgical change in the right proximal femur. IMPRESSION: 1. There is a 5 x 10 mm stone in the left proximal ureter with minimal proximal hydroureter but no hydronephrosis or obstructive changes. Nonobstructing stones in the lower right kidney. 2. Left lower lobe consolidation in the included lung bases with peripherally enhancing fluid component measuring 3.4 cm with foci of internal air suspicious for lung abscess. Small left pleural effusion. Possibility of necrotic neoplasm is also raised, but felt less likely. Recommend close radiographic follow-up after course of treatment to ensure resolution. 3. Bilateral renal cysts. Electronically Signed   By: Melanie  Sanford M.D.   On: 06/22/2021 18:22   DG Chest Port 1 View  Result Date: 06/22/2021 CLINICAL DATA:  Abnormality seen on CT abdomen. Patient reports left-sided abdominal pain. EXAM: PORTABLE CHEST 1 VIEW COMPARISON:  Lung bases from abdominopelvic CT earlier today. Chest radiograph 02/03/2021 FINDINGS: Left basilar airspace disease corresponds to consolidation with fluid collection on CT. Trace left pleural effusion. There is no other consolidation or focal airspace disease. No pneumothorax. Heart is normal in size with normal mediastinal contours. No pulmonary edema. No acute osseous abnormalities are seen. IMPRESSION: Left basilar airspace consolidation corresponds to airspace disease on CT. Trace left pleural effusion. Lungs are otherwise clear. Electronically Signed   By: Melanie   Sanford M.D.   On: 06/22/2021 19:11        Scheduled Meds:  bisacodyl  10 mg Oral Daily   bisacodyl  10 mg Rectal Once   brimonidine-timolol  1 drop Both Eyes BID   enoxaparin (LOVENOX) injection  40 mg Subcutaneous Q24H   feeding supplement  237 mL Oral TID BM   multivitamin  15 mL Oral Daily   polyethylene glycol  17 g Oral Daily   sodium chloride flush  3 mL Intravenous Q12H   Continuous Infusions:  ampicillin-sulbactam (UNASYN) IV 3 g (06/23/21 0821)     LOS: 1 day    Time spent:     Elizabeth G Mathews, MD 06/23/2021, 1:09 PM   

## 2021-06-23 NOTE — Progress Notes (Signed)
MD notified RN that she would like patient to take bisacodyl suppository, wait one hour, then give the patient an enema if there are no results from suppository.

## 2021-06-23 NOTE — TOC Initial Note (Signed)
Transition of Care [facility]) - Initial/Assessment Note    Patient Details  Name: Edward Alvarado MRN: [id number] Date of Birth: 07/08/1981  Transition of Care [facility]) CM/SW Contact:    Lanier Clam, RN Phone Number: 06/23/2021, 3:55 PM  Clinical Narrative:  Spoke to patient's mother [name]-parents both sign for patient/interpret while in hospital.From group home will return.                Expected Discharge Plan: Home/Self Care Barriers to Discharge: Continued Medical Work up   Patient Goals and CMS Choice Patient states their goals for this hospitalization and ongoing recovery are:: go home CMS Medicare.gov Compare Post Acute Care list provided to:: Patient Represenative (must comment) (mother-[name] [phone])    Expected Discharge Plan and Services Expected Discharge Plan: Home/Self Care                                              Prior Living Arrangements/Services                       Activities of Daily Living Home Assistive Devices/Equipment: Other (Comment), Eyeglasses (power chair, manual chair, scooter inside) ADL Screening (condition at time of admission) Patient's cognitive ability adequate to safely complete daily activities?: No Is the patient deaf or have difficulty hearing?: Yes Does the patient have difficulty seeing, even when wearing glasses/contacts?: Yes (trouble seeing in right eye, glaucoma in both eyes) Does the patient have difficulty concentrating, remembering, or making decisions?: No Patient able to express need for assistance with ADLs?: No (uses sign langauage) Does the patient have difficulty dressing or bathing?: Yes Independently performs ADLs?: No Communication: Independent Dressing (OT): Needs assistance Is this a change from baseline?: Pre-admission baseline Grooming: Independent Feeding: Independent Bathing: Needs assistance Is this a change from baseline?: Pre-admission baseline Toileting: Needs assistance Is  this a change from baseline?: Pre-admission baseline In/Out Bed: Needs assistance Is this a change from baseline?: Pre-admission baseline Walks in Home: Dependent (can transfer with help) Is this a change from baseline?: Pre-admission baseline Does the patient have difficulty walking or climbing stairs?: Yes Weakness of Legs: Both Weakness of Arms/Hands: Both  Permission Sought/Granted                  Emotional Assessment              Admission diagnosis:  Kidney stone [N20.0] Lung abscess (HCC) [J85.2] Abscess of lower lobe of left lung with pneumonia (HCC) [J85.1] Sepsis without acute organ dysfunction, due to unspecified organism [facility]) [A41.9] Patient Active Problem List   Diagnosis Date Noted   Lung abscess (HCC) 06/22/2021   Sepsis (HCC) 06/22/2021   Left ureteral stone 06/22/2021   Syncope and collapse 02/22/2021   Enlarged heart 12/29/2020   Watery stools 07/28/2020   Healthcare maintenance 07/28/2020   History of malnutrition 12/19/2019   Encounter for power mobility device assessment 05/24/2017   Wheelchair bound 05/24/2017   Vitamin D insufficiency 05/29/2015   Cerumen impaction 09/02/2013   Hyperlipidemia 07/18/2007   THROMBOCYTOPENIA NOS 06/26/2007   Cerebral palsy (HCC) 06/26/2007   OAG (open angle glaucoma), 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 [place] [address] Phone: [phone] Fax: [phone]  [facility] [address] [place] [address] Phone: [phone] Fax: [phone]  [facility] - [place], [place] - [address] [place] [address] Phone: [phone] Fax: [phone]     Social Determinants of Health (SDOH) Interventions    Readmission Risk Interventions No flowsheet data found.

## 2021-06-23 NOTE — Progress Notes (Signed)
Clinical swallow evaluation completed.  Full report to follow.  Parents present and signed for pt per their desire.  They report pt has been coughing after intake - then vomiting PTA with near syncope episode, severe fatigue, etc  Pt's father asked re: potential for pt to have esophageal spasms - pt denies discomfort/chest pain with swallowing - but uncertain to his ability to provide detailed information re: events.    Pt has known h/o dysphagia with silent aspiration of thin via straw and penetration to vocal cords of thin via cup, no penetration/aspiration of thin via tsp.  Baseline cough noted and hypersalivation reported by parents (? Component of decreased management of secretions due to dysphagia also).  Lingual deviation to left of tongue with lingual protrusion - which pt's mother states is baseline.   CT chest with contrast ordered by MD per notes at 1800 - thus pt only seen with minimal intake including tea via straw, cup and tsp.  Although pt has baseline cough- overt cough noted post-swallow of thin via straw and cup.  Tolerance of single tsps of thin without coughing or indication of aspiration noted. PT declined to consume applesauce during testing.  Pt was given only approximately 2 ounces of liquids over the last four hours per parents.  SLP called xray to question re: need to be npo for CT chest.  Did not provide solids due to potential plan for CT chest tonight.  Xray advised pt needs to be npo for four hours prior to CT of chest with contrast.   When SLP arrived to room to post-swallow precaution sign, family reports pt overtly coughing and spitting when [facility] lowered for suppository causing SLP to suspect potential multifocal dysphagia including esophageal component.   Family states pt has inconsistent po intake/appetite.  Recommend given h/o dysphagia/aspiration, pulmonary concerns, tsps thin liquids and MBS next date after CT chest and after pt able to have move his bowels.  Family in  agreement with recommendation.  Consideration for dedicated esophageal evaluation may be beneficial to help elucidate dysphagia/potential aspiration source.   Advised pt/family to suspicion for chronic secretion aspiration and reviewed importance of oral care for this reason.    Rolena Infante, MS [facility] SLP Acute Rehab Services Office [phone] Pager [phone]

## 2021-06-23 NOTE — Progress Notes (Signed)
   06/22/21 2109  Assess: MEWS Score  Temp 99.2 F (37.3 C)  BP 106/62  Pulse Rate (!) 113  Resp 20  Level of Consciousness Alert  SpO2 95 %  O2 Device Room Air  Patient Activity (if Appropriate) In bed  Assess: MEWS Score  MEWS Temp 0  MEWS Systolic 0  MEWS Pulse 2  MEWS RR 0  MEWS LOC 0  MEWS Score 2  MEWS Score Color Yellow  Assess: if the MEWS score is Yellow or Red  Were vital signs taken at a resting state? Yes  Focused Assessment No change from prior assessment (Just assessed patient for the first time. Patient was just admitted to the floor)  Does the patient meet 2 or more of the SIRS criteria? No  MEWS guidelines implemented *See Row Information* Yes  Treat  MEWS Interventions Other (Comment)  Pain Scale Faces  Faces Pain Scale 0  Take Vital Signs  Increase Vital Sign Frequency  Yellow: Q 2hr X 2 then Q 4hr X 2, if remains yellow, continue Q 4hrs  Notify: Charge Nurse/RN  Name of Charge Nurse/RN Notified [place], RN  Date Charge Nurse/RN Notified 06/22/21  Time Charge Nurse/RN Notified 2109  Assess: SIRS CRITERIA  SIRS Temperature  0  SIRS Pulse 1  SIRS Respirations  0  SIRS WBC 1  SIRS Score Sum  2

## 2021-06-23 NOTE — Progress Notes (Signed)
Initial Nutrition Assessment  DOCUMENTATION CODES:  Not applicable  INTERVENTION:  Add Vital Cuisine Shake daily with breakfast, each supplement provides 520 kcal and 22 grams of protein.  Add Ensure Plus po TID, each supplement provides 350 kcal and 13 grams of protein.  Add liquid MVI daily.  NUTRITION DIAGNOSIS:  Inadequate oral intake related to decreased appetite, acute illness (sepsis) as evidenced by per patient/family report, meal completion < 25%.  GOAL:  Patient will meet greater than or equal to 90% of their needs  MONITOR:  PO intake, Supplement acceptance, Labs, Weight trends, I & O's  REASON FOR ASSESSMENT:  Malnutrition Screening Tool    ASSESSMENT:  40 yo male with a PMH of cerebral palsy who is wheelchair-bound, deafness, scoliosis, glaucoma, and nephrolithiasis who presents with sepsis 2/2 L lower lung abscess and L proximal ureteral stone.  Pt asleep at bedside. Spoke with father. Father reports patient tends not to eat very well when he is ill. He reports that when he came to visit them PTA, he only ate a cup of yogurt that morning. Mother is at home, but she ordered his meals for today. Father reports he did not want breakfast. Per Epic, pt ate 0% of breakfast this morning.  Father unsure of any weight loss. Per Epic, pt has lost ~6 lbs (3.7%) in the last month, which is not necessarily significant for the time frame.  On exam, pt no significant depletions.  RD encouraged father to encourage patient to eat and drink during meal times. Father reports patient drank Ensure to gain weight when he was younger and not eating well. Father thinks patient would enjoy chocolate Ensure and Hormel shakes while he is in the hospital.  RD to order Hormel shake daily with breakfast and Ensure Plus TID between meals. Also will add liquid multivitamin to regimen.  Medications: reviewed; Unasyn QID via IV  Labs: reviewed  NUTRITION - FOCUSED PHYSICAL EXAM: Flowsheet Row  Most Recent Value  Orbital Region Mild depletion  Upper Arm Region No depletion  Thoracic and Lumbar Region No depletion  Buccal Region No depletion  Temple Region No depletion  Clavicle Bone Region Mild depletion  Clavicle and Acromion Bone Region No depletion  Scapular Bone Region No depletion  Dorsal Hand No depletion  Patellar Region Unable to assess  Anterior Thigh Region Unable to assess  Posterior Calf Region Unable to assess  Edema (RD Assessment) None  Hair Reviewed  Eyes Reviewed  Mouth Reviewed  Skin Reviewed  Nails Reviewed   Diet Order:   Diet Order             Diet regular Room service appropriate? Yes; Fluid consistency: Thin  Diet effective now                  EDUCATION NEEDS:  Education needs have been addressed  Skin:  Skin Assessment: Reviewed RN Assessment (Ecchymosis)  Last BM:  06/21/21  Height:  Ht Readings from Last 1 Encounters:  06/22/21 5\' 9"  (1.753 m)   Weight:  Wt Readings from Last 1 Encounters:  06/22/21 68 kg   BMI:  Body mass index is 22.15 kg/m.  Estimated Nutritional Needs:  Kcal:  1900-2100 Protein:  80-95 grams Fluid:  >1.9 L  08/22/21, RD, LDN (she/her/hers) Registered Dietitian I After-Hours/Weekend Pager # in [place]

## 2021-06-23 NOTE — Evaluation (Signed)
Clinical/Bedside Swallow Evaluation Patient Details  Name: Edward Alvarado MRN: [id number] Date of Birth: 04/14/1981  Today's Date: 06/23/2021 Time: SLP Start Time (ACUTE ONLY): 1815 SLP Stop Time (ACUTE ONLY): 1855 SLP Time Calculation (min) (ACUTE ONLY): 40 min  Past Medical History:  Past Medical History:  Diagnosis Date   Allergic rhinitis    Allergy    Atrophy, cortical    Lower left ventrical   Cerebral palsy (HCC)    W/C BOUND-ABLE TO TRANSFER W/C TO BED -DOES REQUIRE SOME HELP IN BATHROOM WITH CLOTHES--LIVES IN GROUP HOME CALLED [name]-HIS PARENTS ARE HIS LEGAL GUARDIANS   Complication of anesthesia    JAN 2012 EYE SURGERY AT [facility] WAS USED AND PT EXPERIENCED AIRWAY / OXYGENATION PROBLEMS.  PT HAD SUBSEQUENT SURGERIES  AFTER JAN 2012 AT [facility] AND PARENTS TOLD ET USED AND PT DID FINE.  PT HAD NO ANESTHESIA PROBLEMS WITH SURGERIES PRIOR TO THE JAN 2012 SURGERY.   Deafness    hearing aids-NOT WEARING AT PRESENT TIME; ABLE TO DO SIGN LANGUAGE WITH HIS PARENTS   Glaucoma    Hyperlipidemia    Kidney stone    Platelets decreased (HCC)    Prepatellar bursitis    Hospitalized in 2012 for sepsis secondary to bursisiitis   Scoliosis    Past Surgical History:  Past Surgical History:  Procedure Laterality Date   ADENOIDECTOMY     CYSTOSCOPY/RETROGRADE/URETEROSCOPY  10/01/2012   Procedure: CYSTOSCOPY/RETROGRADE/URETEROSCOPY;  Surgeon: Crecencio Mc, MD;  Location: [facility];  Service: Urology;  Laterality: Left;  laser lithotripsy stone extraction on left   eye surgery  2012   [facility]   EYE SURGERY     4 RIGHT EYE SURGERIES FOR GLAUCOMA AND ONE  RT EYE CATARACT EXTRACTION; LEFT EYE CATARACT EXTRACTION WITH  I STENTPROCEDURE FOR GLAUCOMA   HOLMIUM LASER APPLICATION  10/01/2012   Procedure: HOLMIUM LASER APPLICATION;  Surgeon: Crecencio Mc, MD;  Location: [facility];  Service: Urology;  Laterality: Left;   multiple orthopedic procedures     MYRINGOTOMY     SELECTIVE RHIZOTOMY PROCEDURE  TO HELP REDUCE SPASTICITY AND CONTRACTURES     HPI:  40 yo male with a PMH of cerebral palsy who is wheelchair-bound, deafness, scoliosis, glaucoma, and nephrolithiasis who presents with sepsis 2/2 L lower lung abscess and L proximal ureteral stone.   Swallow evaluation ordered. Pt has h/o dysphagia per MBS 2008.  Pt's mother reported pt did not like the thickened liquids recommended post"MBS 2008 and pt stopped using it.  She also reports he has started using straws more but was advised against it previously.  Mother and father report pt's po intake varies - at times he will not eat. Per notes, family report - pt has been coughing and vomiting after intake today. At times prior to admit, he would hyper salivate, cough and nearly pass out or become excesively fatigued.  in the past. Delayed coughing can also be presents after meals/po per family - causing SLP to suspect multifactorial dysphagia *kyphosis/scoliosis potentialy contributing to esophageal* and CP contributing to oropharyngeal deficits.   Assessment / Plan / Recommendation Clinical Impression  Parents present and signed for pt per their desire.  They report pt has been coughing after intake - then vomiting PTA with near syncope episode, severe fatigue, etc  Pt's father asked re: potential for pt to have esophageal spasms - pt denies discomfort/chest pain with swallowing - but uncertain to his ability to provide detailed information re: events.  Pt has known h/o dysphagia with silent aspiration of thin via straw and penetration to vocal cords of thin via cup, no penetration/aspiration of thin via tsp.  Baseline cough noted and hypersalivation reported by parents (? Component of decreased management of secretions due to dysphagia also).  Lingual deviation to left of tongue with lingual protrusion - which pt's mother states is baseline.     CT chest with contrast ordered by MD per notes at 1800 - thus pt only seen with minimal intake including  tea via straw, cup and tsp.  Although pt has baseline cough- overt cough noted post-swallow of thin via straw and cup.  Tolerance of single tsps of thin without coughing or indication of aspiration noted. PT declined to consume applesauce during testing.  Pt was given only approximately 2 ounces of liquids over the last four hours per parents.  SLP called xray to question re: need to be npo for CT chest.  Did not provide solids due to potential plan for CT chest tonight.  Xray advised pt needs to be npo for four hours prior to CT of chest with contrast.     When SLP arrived to room to post-swallow precaution sign, family reports pt overtly coughing and spitting when [facility] lowered for suppository causing SLP to suspect potential multifocal dysphagia including esophageal component.     Family states pt has inconsistent po intake/appetite.  Recommend given h/o dysphagia/aspiration, pulmonary concerns, tsps thin liquids and MBS next date after CT chest and after pt able to have move his bowels.  Family in agreement with recommendation.  Consideration for dedicated esophageal evaluation may be beneficial to help elucidate dysphagia/potential aspiration source.     Advised pt/family to suspicion for chronic secretion aspiration and reviewed importance of oral care for this reason. SLP Visit Diagnosis: Dysphagia, oropharyngeal phase (R13.12)    Aspiration Risk  Moderate aspiration risk    Diet Recommendation Thin liquid (tsps thin)   Liquid Administration via: Spoon Supervision: Full supervision/cueing for compensatory strategies;Comment (family to feed pt) Compensations: Slow rate;Small sips/bites Postural Changes: Seated upright at 90 degrees;Remain upright for at least 30 minutes after po intake    Other  Recommendations Recommended Consults: Consider GI evaluation;Consider esophageal assessment Oral Care Recommendations: Oral care BID   Follow up Recommendations Other (comment) (TBD)      Frequency and  Duration min 1 x/week  2 weeks       Prognosis Prognosis for Safe Diet Advancement: Fair Barriers to Reach Goals: Time post onset      Swallow Study   General Date of Onset: 06/23/21 HPI: 41 yo male with a PMH of cerebral palsy who is wheelchair-bound, deafness, scoliosis, glaucoma, and nephrolithiasis who presents with sepsis 2/2 L lower lung abscess and L proximal ureteral stone.   Swallow evaluation ordered. Pt has h/o dysphagia per MBS 2008.  Pt's mother reported pt did not like the thickened liquids recommended post"MBS 2008 and pt stopped using it.  She also reports he has started using straws more but was advised against it previously.  Mother and father report pt's po intake varies - at times he will not eat. Per notes, family report - pt has been coughing and vomiting after intake today. At times prior to admit, he would hyper salivate, cough and nearly pass out or become excesively fatigued.  in the past. Delayed coughing can also be presents after meals/po per family - causing SLP to suspect multifactorial dysphagia *kyphosis/scoliosis potentialy contributing to esophageal* and CP  contributing to oropharyngeal deficits. Type of Study: Bedside Swallow Evaluation Diet Prior to this Study: Thin liquids;Regular Temperature Spikes Noted: No Respiratory Status: Nasal cannula History of Recent Intubation: No Behavior/Cognition: Alert Oral Cavity Assessment: Excessive secretions Oral Care Completed by SLP: No Oral Cavity - Dentition: Adequate natural dentition;Other (Comment) Vision: Impaired for self-feeding Self-Feeding Abilities: Total assist Patient Positioning: Upright in bed Baseline Vocal Quality: Not observed;Other (comment) (pt is deaf and does not voice) Volitional Cough: Cognitively unable to elicit Volitional Swallow: Unable to elicit    Oral/Motor/Sensory Function Overall Oral Motor/Sensory Function: Mild impairment Facial Strength: Within Functional Limits Lingual ROM:  Reduced left;Suspected CN XII (hypoglossal) dysfunction Lingual Symmetry: Within Functional Limits Lingual Strength: Reduced;Suspected CN XII (hypoglossal) dysfunction Velum: Other (comment) (could not view)   Ice Chips Ice chips: Not tested   Thin Liquid Thin Liquid: Impaired Presentation: Cup;Straw;Spoon Pharyngeal  Phase Impairments: Cough - Immediate;Cough - Delayed    Nectar Thick Nectar Thick Liquid: Not tested   Honey Thick Honey Thick Liquid: Not tested   Puree Puree: Not tested (pt declined)   Solid     Solid: Not tested      Chales Abrahams 06/23/2021,9:23 PM  Rolena Infante, MS [facility] SLP Acute Rehab Services Office [phone] Pager [phone]

## 2021-06-24 LAB — CBC
HCT: 33.2 % — ABNORMAL LOW (ref 39.0–52.0)
Hemoglobin: 11.3 g/dL — ABNORMAL LOW (ref 13.0–17.0)
MCH: 31.2 pg (ref 26.0–34.0)
MCHC: 34 g/dL (ref 30.0–36.0)
MCV: 91.7 fL (ref 80.0–100.0)
Platelets: 154 10*3/uL (ref 150–400)
RBC: 3.62 MIL/uL — ABNORMAL LOW (ref 4.22–5.81)
RDW: 11.9 % (ref 11.5–15.5)
WBC: 11.7 10*3/uL — ABNORMAL HIGH (ref 4.0–10.5)
nRBC: 0 % (ref 0.0–0.2)

## 2021-06-24 LAB — COMPREHENSIVE METABOLIC PANEL
ALT: 12 U/L (ref 0–44)
AST: 10 U/L — ABNORMAL LOW (ref 15–41)
Albumin: 2.6 g/dL — ABNORMAL LOW (ref 3.5–5.0)
Alkaline Phosphatase: 54 U/L (ref 38–126)
Anion gap: 8 (ref 5–15)
BUN: 9 mg/dL (ref 6–20)
CO2: 24 mmol/L (ref 22–32)
Calcium: 8 mg/dL — ABNORMAL LOW (ref 8.9–10.3)
Chloride: 106 mmol/L (ref 98–111)
Creatinine, Ser: 0.78 mg/dL (ref 0.61–1.24)
GFR, Estimated: >60 mL/min (ref >60)
Glucose, Bld: 94 mg/dL (ref 70–99)
Potassium: 3.5 mmol/L (ref 3.5–5.1)
Sodium: 138 mmol/L (ref 135–145)
Total Bilirubin: 1.4 mg/dL — ABNORMAL HIGH (ref 0.3–1.2)
Total Protein: 5.5 g/dL — ABNORMAL LOW (ref 6.5–8.1)

## 2021-06-24 MED ORDER — BENZONATATE 100 MG PO CAPS
100.0000 mg | ORAL_CAPSULE | Freq: Three times a day (TID) | ORAL | Status: DC | PRN
  Administered 2021-06-25: 100 mg via ORAL
  Filled 2021-06-24: qty 1

## 2021-06-24 NOTE — Progress Notes (Signed)
  Speech Language Pathology Treatment: Dysphagia  Patient Details Name: Edward Alvarado MRN: [id number] DOB: 1981-03-17 Today's Date: 06/24/2021 Time: 9326-7124 SLP Time Calculation (min) (ACUTE ONLY): 23 min  Assessment / Plan / Recommendation Clinical Impression  Subjectively, Edward Alvarado's oropharyngeal swallow has improved from  yesterday. He has not vomited since 0130 per RN and pt's father. There were no indications of aspiration with multiple teaspoon sips thin or solid textures this session. Oral impairments were typical and expected intermittently with pt's having CP.  At home feel he likely intermittently aspirates as dad states occasional coughing however he has never had pna. In 2008 history of silent aspiration at that time. Dad states he will not eat for 1-2 days then is ravenous the next without distinct pattern. No problem with constipation prior to this admission. Dad described remote episodes of sudden lethargy, close to passing out and coughing during and after meals. SLP suspects esophageal source or other GI involvement.  Noted Dr. Haywood Pao plan for EGD.  Pt may not need MBS (?), possibly barium esophagram depending on GI's findings and recommendations. SLP will check chart tomorrow for timing of EGD.    HPI HPI: 40 yo male with a PMH of cerebral palsy who is wheelchair-bound, deafness, scoliosis, glaucoma, and nephrolithiasis who presents with sepsis 2/2 L lower lung abscess and L proximal ureteral stone.   Swallow evaluation ordered. Pt has h/o dysphagia per MBS 2008.  Pt's mother reported pt did not like the thickened liquids recommended post"MBS 2008 and pt stopped using it.  She also reports he has started using straws more but was advised against it previously.  Mother and father report pt's po intake varies - at times he will not eat. Per notes, family report - pt has been coughing and vomiting after intake today. At times prior to admit, he would hyper salivate, cough and nearly pass  out or become excesively fatigued.  in the past. Delayed coughing can also be presents after meals/po per family - causing SLP to suspect multifactorial dysphagia *kyphosis/scoliosis potentialy contributing to esophageal* and CP contributing to oropharyngeal deficits.      SLP Plan  Continue with current plan of care       Recommendations  Diet recommendations: Thin liquid;Regular Liquids provided via: Teaspoon Medication Administration: Crushed with puree Supervision: Full supervision/cueing for compensatory strategies Compensations: Slow rate;Small sips/bites Postural Changes and/or Swallow Maneuvers: Seated upright 90 degrees;Upright 30-60 min after meal                Oral Care Recommendations: Oral care BID Follow up Recommendations: None SLP Visit Diagnosis: Dysphagia, oropharyngeal phase (R13.12) Plan: Continue with current plan of care       GO                Royce Macadamia 06/24/2021, 12:32 PM

## 2021-06-24 NOTE — Progress Notes (Signed)
Patient vomited small amount once at 1:30.

## 2021-06-24 NOTE — Progress Notes (Signed)
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  COVID-negative influenza negative. CT abdomen and pelvis-left lower lobe consolidation with peripherally enhancing fluid component measuring 3.4 cm with foci of internal air suspicious for lung abscess.  A 5 x 10 mm stone in the left proximal ureter with minimal proximal hydroureter but no hydronephrosis or obstructive changes also noted.  Nonobstructing stones in the lower right kidney also seen.   Portable chest x-ray showed left basilar airspace consolidation corresponding to airspace disease on CT. Assessment & Plan:   Principal Problem:   Sepsis ([place]) Active Problems:   Lung abscess ([place])   Left ureteral stone  #1 sepsis patient met criteria for sepsis on admission with fever 102.3 tachycardia and leukocytosis.  Sepsis likely secondary to left lower lung consolidation concern for silent aspiration/lung abscess. CT chest shows a 5.5 cm thick-walled cavitary lesion with fluid and gas in the left lower lobe surrounding groundglass opacity and trace left pleural effusion.  Follow-up chest CT suggested in 6 to 12 weeks after appropriate antimicrobial therapy. Continue Unasyn while he is in the hospital will DC on Augmentin for a total of 4 weeks.  Discussed with PCCM. Appreciate speech therapy input concern for silent aspiration. Leukocytosis improving he is on room air afebrile.  #2 left ureteral stone with no obstruction.  Follow-up with urology as outpatient.  No evidence of hydronephrosis.  #3 constipation stool softeners ordered.  Resolved.  #4 history of cerebral palsy wheelchair-bound lives in group  home.  #5 glaucoma continue eyedrops     Nutrition Problem: Inadequate oral intake Etiology: decreased appetite, acute illness (sepsis)     Signs/Symptoms: per patient/family report, meal completion < 25%    Interventions: Ensure Enlive (each supplement provides 350kcal and 20 grams of protein), Hormel Shake, MVI  Estimated body mass index is 22.15 kg/m as calculated from the following:   Height as of this encounter: $RemoveBeforeD'5\' 9"'JaIrPfprLYYhGo$  (1.753 m).   Weight as of this encounter: 68 kg.  DVT prophylaxis: Lovenox Code Status: Full code Family Communication: Discussed with father in the room Disposition Plan:  Status is: Inpatient  Remains inpatient appropriate because:IV treatments appropriate due to intensity of illness or inability to take PO  Dispo: The patient is from: Group home              Anticipated d/c is to: Group home              Patient currently is not medically stable to d/c.   Difficult to place patient No       Consultants:  None  Procedures: None Antimicrobials Unasyn Subjective:  He is resting in bed family by the bedside.  He had bowel movements yesterday abdominal pain is better cough is better Objective: Vitals:   06/23/21 1608 06/23/21 2026 06/24/21 0033 06/24/21 0443  BP: 107/71 120/73 (!) 100/54 101/62  Pulse: 98 94 98 86  Resp: $Remo'20 16 16 16  'CrBdI$ Temp: 99.2 F (37.3 C) 98.8 F (37.1 C) 98.1 F (36.7 C) 98.4 F (36.9 C)  TempSrc: Oral Oral Oral Oral  SpO2: 98% 91% 94% 95%  Weight:      Height:        Intake/Output  Summary (Last 24 hours) at 06/24/2021 1428 Last data filed at 06/24/2021 1316 Gross per 24 hour  Intake 1793.66 ml  Output 300 ml  Net 1493.66 ml    Filed Weights   06/22/21 1409  Weight: 68 kg    Examination:  General exam: Appears calm and comfortable  Respiratory system: Few rhonchi on the left side to auscultation. Respiratory effort normal. Cardiovascular system: S1 & S2 heard, RRR. No JVD, murmurs, rubs, gallops or  clicks. No pedal edema. Gastrointestinal system: Abdomen is nondistended, soft and nontender. No organomegaly or masses felt. Normal bowel sounds heard. Central nervous system: Alert and oriented. No focal neurological deficits. Extremities: Symmetric 5 x 5 power. Skin: No rashes, lesions or ulcers Psychiatry: Judgement and insight appear normal. Mood & affect appropriate.     Data Reviewed: I have personally reviewed following labs and imaging studies  CBC: Recent Labs  Lab 06/22/21 1627 06/23/21 0459 06/24/21 0437  WBC 22.8* 14.1* 11.7*  NEUTROABS 18.3*  --   --   HGB 15.3 12.2* 11.3*  HCT 44.3 35.0* 33.2*  MCV 91.0 91.6 91.7  PLT 222 150 937    Basic Metabolic Panel: Recent Labs  Lab 06/22/21 1627 06/23/21 0459 06/24/21 0437  NA 135 135 138  K 3.8 3.7 3.5  CL 99 105 106  CO2 $Re'23 22 24  'wxn$ GLUCOSE 139* 96 94  BUN $Re'19 13 9  'KaO$ CREATININE 0.97 0.74 0.78  CALCIUM 9.3 8.3* 8.0*  MG  --  1.7  --     GFR: Estimated Creatinine Clearance: 116.9 mL/min (by C-G formula based on SCr of 0.78 mg/dL). Liver Function Tests: Recent Labs  Lab 06/22/21 1627 06/24/21 0437  AST 20 10*  ALT 19 12  ALKPHOS 81 54  BILITOT 1.9* 1.4*  PROT 8.1 5.5*  ALBUMIN 4.0 2.6*    Recent Labs  Lab 06/22/21 1627  LIPASE 25    No results for input(s): AMMONIA in the last 168 hours. Coagulation Profile: No results for input(s): INR, PROTIME in the last 168 hours. Cardiac Enzymes: No results for input(s): CKTOTAL, CKMB, CKMBINDEX, TROPONINI in the last 168 hours. BNP (last 3 results) No results for input(s): PROBNP in the last 8760 hours. HbA1C: No results for input(s): HGBA1C in the last 72 hours. CBG: No results for input(s): GLUCAP in the last 168 hours. Lipid Profile: No results for input(s): CHOL, HDL, LDLCALC, TRIG, CHOLHDL, LDLDIRECT in the last 72 hours. Thyroid Function Tests: No results for input(s): TSH, T4TOTAL, FREET4, T3FREE, THYROIDAB in the last 72 hours. Anemia  Panel: No results for input(s): VITAMINB12, FOLATE, FERRITIN, TIBC, IRON, RETICCTPCT in the last 72 hours. Sepsis Labs: Recent Labs  Lab 06/22/21 1657 06/23/21 0459  PROCALCITON  --  0.37  LATICACIDVEN 1.4  --      Recent Results (from the past 240 hour(s))  Culture, blood (routine x 2)     Status: None (Preliminary result)   Collection Time: 06/22/21  4:57 PM   Specimen: Left Antecubital; Blood  Result Value Ref Range Status   Specimen Description   Final    LEFT ANTECUBITAL Performed at [facility], [address] [place], [place] [address]    Special Requests   Final    BOTTLES DRAWN AEROBIC AND ANAEROBIC Blood Culture adequate volume Performed at [facility], [address] [place], [place] [address]    Culture   Final    NO GROWTH 2 DAYS Performed at [facility]  Lab, [address], [place], [place] [address]    Report Status PENDING  Incomplete  Resp Panel by RT-PCR (Flu A&B, Covid) Nasopharyngeal Swab     Status: None   Collection Time: 06/22/21  6:18 PM   Specimen: Nasopharyngeal Swab; Nasopharyngeal(NP) swabs in vial transport medium  Result Value Ref Range Status   SARS Coronavirus 2 by RT PCR NEGATIVE NEGATIVE Final    Comment: (NOTE) SARS-CoV-2 target nucleic acids are NOT DETECTED.  The SARS-CoV-2 RNA is generally detectable in upper respiratory specimens during the acute phase of infection. The lowest concentration of SARS-CoV-2 viral copies this assay can detect is 138 copies/mL. A negative result does not preclude SARS-Cov-2 infection and should not be used as the sole basis for treatment or other patient management decisions. A negative result may occur with  improper specimen collection/handling, submission of specimen other than nasopharyngeal swab, presence of viral mutation(s) within the areas targeted by this assay, and inadequate number of viral copies(<138 copies/mL). A negative result must be combined with clinical  observations, patient history, and epidemiological information. The expected result is Negative.  Fact Sheet for Patients:  EntrepreneurPulse.com.au  Fact Sheet for Healthcare Providers:  IncredibleEmployment.be  This test is no t yet approved or cleared by the [place] FDA and  has been authorized for detection and/or diagnosis of SARS-CoV-2 by FDA under an Emergency Use Authorization (EUA). This EUA will remain  in effect (meaning this test can be used) for the duration of the COVID-19 declaration under Section 564(b)(1) of the Act, 21 U.S.C.section 360bbb-3(b)(1), unless the authorization is terminated  or revoked sooner.       Influenza A by PCR NEGATIVE NEGATIVE Final   Influenza B by PCR NEGATIVE NEGATIVE Final    Comment: (NOTE) The Xpert Xpress SARS-CoV-2/FLU/RSV plus assay is intended as an aid in the diagnosis of influenza from Nasopharyngeal swab specimens and should not be used as a sole basis for treatment. Nasal washings and aspirates are unacceptable for Xpert Xpress SARS-CoV-2/FLU/RSV testing.  Fact Sheet for Patients: EntrepreneurPulse.com.au  Fact Sheet for Healthcare Providers: IncredibleEmployment.be  This test is not yet approved or cleared by the [place] FDA and has been authorized for detection and/or diagnosis of SARS-CoV-2 by FDA under an Emergency Use Authorization (EUA). This EUA will remain in effect (meaning this test can be used) for the duration of the COVID-19 declaration under Section 564(b)(1) of the Act, 21 U.S.C. section 360bbb-3(b)(1), unless the authorization is terminated or revoked.  Performed at [facility], [address] [place], [place] [address]   MRSA Next Gen by PCR, Nasal     Status: None   Collection Time: 06/23/21  6:48 AM   Specimen: Nasal Mucosa; Nasal Swab  Result Value Ref Range Status   MRSA by PCR Next Gen NOT DETECTED  NOT DETECTED Final    Comment: (NOTE) The GeneXpert MRSA Assay (FDA approved for NASAL specimens only), is one component of a comprehensive MRSA colonization surveillance program. It is not intended to diagnose MRSA infection nor to guide or monitor treatment for MRSA infections. Test performance is not FDA approved in patients less than 37 years old. Performed at [facility], [address] [place], [place] [address]           Radiology Studies: CT CHEST W CONTRAST  Result Date: 06/23/2021 CLINICAL DATA:  Lung abscess EXAM: CT CHEST WITH CONTRAST TECHNIQUE: Multidetector CT imaging of the chest was performed during intravenous contrast  administration. CONTRAST:  94mL OMNIPAQUE IOHEXOL 350 MG/ML SOLN COMPARISON:  Partial comparison with CT abdomen/pelvis dated 06/22/2021 FINDINGS: Motion degraded images. Cardiovascular: The heart is normal in size. No pericardial effusion. No evidence of thoracic aortic aneurysm. Mediastinum/Nodes: No suspicious mediastinal lymphadenopathy. Visualized thyroid is unremarkable. Lungs/Pleura: Evaluation of the lung parenchyma is constrained by respiratory motion. 4.0 x 5.5 cm thick-walled cavitary lesion with internal fluid/gas in the left lower lobe (series 5/image 97). Surrounding ground-glass opacity in the left lower lobe. Additional mild patchy opacity in the superior segment left lower lobe (series 5/image 68). This overall appearance favors left lobe pneumonia with lung abscess. Cavitary/necrotic neoplasm such as squamous cell carcinoma is considered unlikely, although follow-up is warranted to document resolution. Associated trace left pleural effusion.  No pneumothorax. Upper Abdomen: Visualized upper abdomen is grossly unremarkable. Musculoskeletal: Visualized osseous structures are within normal limits. IMPRESSION: 5.5 cm thick-walled cavitary lesion with fluid/gas in the left lower lobe, surrounding ground-glass opacity, and trace  left pleural effusion. Overall appearance favors left lower lobe pneumonia with lung abscess. Follow-up CT chest is suggested in 6-12 weeks after appropriate antimicrobial therapy. Electronically Signed   By: Julian Hy M.D.   On: 06/23/2021 23:39   CT Abdomen Pelvis W Contrast  Result Date: 06/22/2021 CLINICAL DATA:  Fever and abdominal pain. EXAM: CT ABDOMEN AND PELVIS WITH CONTRAST TECHNIQUE: Multidetector CT imaging of the abdomen and pelvis was performed using the standard protocol following bolus administration of intravenous contrast. CONTRAST:  124mL OMNIPAQUE IOHEXOL 300 MG/ML  SOLN COMPARISON:  Noncontrast CT 09/19/2012 FINDINGS: Lower chest: Small left pleural effusion. There is a left lower lobe consolidation measuring approximately 4.8 cm transverse. Peripherally enhancing fluid component measuring 3.4 cm suspicious for lung abscess, with tiny bubbles of air. There is adjacent ground-glass and ill-defined opacity. No visualized pulmonary emboli in the lung bases. Hepatobiliary: No focal liver abnormality is seen. No gallstones, gallbladder wall thickening, or biliary dilatation. Pancreas: No ductal dilatation or inflammation. Spleen: Normal in size without focal abnormality.  Splenule medially Adrenals/Urinary Tract: No adrenal nodule. There is a 5 x 10 mm stone in the left proximal ureter (at the level of L3). Minimal proximal hydroureter but no hydronephrosis or obstructive changes. There is homogeneous enhancement and symmetric excretion on delayed phase imaging. Suspected nonobstructing stones in the lower right kidney are obscured by motion. Bilateral low-density lesions in both kidneys, larger lesions representing cysts, smaller lesions not well characterized due to motion and small size. Suspicious solid lesion. Minimally distended urinary bladder. No bladder wall thickening or stone. Stomach/Bowel: Lack of enteric contrast and motion limits detailed bowel assessment. Fluid within  physiologically distended stomach. No small bowel obstruction or inflammation. Normal appendix. Air and small volume of stool in the colon. Sigmoid colon is redundant. No colonic inflammation. There is stool distending the rectum. Vascular/Lymphatic: Normal caliber abdominal aorta. Circumaortic left renal vein. Patent portal vein. No abdominopelvic adenopathy. Reproductive: Prostate is unremarkable. Other: Tiny fat containing umbilical hernia. No free air, free fluid, or intra-abdominal fluid collection. Musculoskeletal: There are no acute or suspicious osseous abnormalities. Dysplastic appearance of the right acetabulum. Postsurgical change in the right proximal femur. IMPRESSION: 1. There is a 5 x 10 mm stone in the left proximal ureter with minimal proximal hydroureter but no hydronephrosis or obstructive changes. Nonobstructing stones in the lower right kidney. 2. Left lower lobe consolidation in the included lung bases with peripherally enhancing fluid component measuring 3.4 cm with foci of internal air suspicious for lung abscess. Small  left pleural effusion. Possibility of necrotic neoplasm is also raised, but felt less likely. Recommend close radiographic follow-up after course of treatment to ensure resolution. 3. Bilateral renal cysts. Electronically Signed   By: Keith Rake M.D.   On: 06/22/2021 18:22   DG Chest Port 1 View  Result Date: 06/22/2021 CLINICAL DATA:  Abnormality seen on CT abdomen. Patient reports left-sided abdominal pain. EXAM: PORTABLE CHEST 1 VIEW COMPARISON:  Lung bases from abdominopelvic CT earlier today. Chest radiograph 02/03/2021 FINDINGS: Left basilar airspace disease corresponds to consolidation with fluid collection on CT. Trace left pleural effusion. There is no other consolidation or focal airspace disease. No pneumothorax. Heart is normal in size with normal mediastinal contours. No pulmonary edema. No acute osseous abnormalities are seen. IMPRESSION: Left basilar  airspace consolidation corresponds to airspace disease on CT. Trace left pleural effusion. Lungs are otherwise clear. Electronically Signed   By: Keith Rake M.D.   On: 06/22/2021 19:11        Scheduled Meds:  bisacodyl  10 mg Oral Daily   brimonidine-timolol  1 drop Both Eyes BID   enoxaparin (LOVENOX) injection  40 mg Subcutaneous Q24H   feeding supplement  237 mL Oral TID BM   multivitamin  15 mL Oral Daily   pantoprazole (PROTONIX) IV  40 mg Intravenous Q12H   polyethylene glycol  17 g Oral Daily   sodium chloride flush  3 mL Intravenous Q12H   sorbitol, milk of mag, mineral oil, glycerin (SMOG) enema  200 mL Rectal Once   Continuous Infusions:  ampicillin-sulbactam (UNASYN) IV 3 g (06/24/21 1315)   lactated ringers 100 mL/hr at 06/24/21 0231     LOS: 2 days    Time spent: 59 min    Georgette Shell, MD 06/24/2021, 2:28 PM

## 2021-06-24 NOTE — Consult Note (Signed)
Reason for Consult:Dysphagia, cough, aspiration pnumonia Referring Physician: [facility]  Renard HamperEdward W Crotteau HPI: This is a 40 year old male with a PMH of cerebral palsy, who is wheelchair bound, deafness, glaucoma, scoliosis, nephrolithiasis, and dysphagia admitted for a LLL lung abscess.  The patient initially complained about having left sided abdominal pain.  He was brought to the ER and a CT scan noted that he had a 5 cm LLL lung abscess with an air-fluid level.  At the time of admission he was documented to have a Tmax of 102.3 F.  His parents report that he tends to have coughing spells after PO intake of late.  It is to the point that he has hypersalivation and his cough paroxysm can cause him to have a near syncopal episode.  There was concern for aspiration pneumonia and Speech Pathology was consulted.  SLP felt that may be a chronic secretion aspiration.  A GI consultation was requested to evaluate for any esophageal source for his dysphagia.  Past Medical History:  Diagnosis Date   Allergic rhinitis    Allergy    Atrophy, cortical    Lower left ventrical   Cerebral palsy (HCC)    W/C BOUND-ABLE TO TRANSFER W/C TO BED -DOES REQUIRE SOME HELP IN BATHROOM WITH CLOTHES--LIVES IN GROUP HOME CALLED [name]-HIS PARENTS ARE HIS LEGAL GUARDIANS   Complication of anesthesia    JAN 2012 EYE SURGERY AT [facility]--LMA WAS USED AND PT EXPERIENCED AIRWAY / OXYGENATION PROBLEMS.  PT HAD SUBSEQUENT SURGERIES  AFTER JAN 2012 AT [facility] AND PARENTS TOLD ET USED AND PT DID FINE.  PT HAD NO ANESTHESIA PROBLEMS WITH SURGERIES PRIOR TO THE JAN 2012 SURGERY.   Deafness    hearing aids-NOT WEARING AT PRESENT TIME; ABLE TO DO SIGN LANGUAGE WITH HIS PARENTS   Glaucoma    Hyperlipidemia    Kidney stone    Platelets decreased (HCC)    Prepatellar bursitis    Hospitalized in 2012 for sepsis secondary to bursisiitis   Scoliosis     Past Surgical History:  Procedure Laterality Date   ADENOIDECTOMY      CYSTOSCOPY/RETROGRADE/URETEROSCOPY  10/01/2012   Procedure: CYSTOSCOPY/RETROGRADE/URETEROSCOPY;  Surgeon: Crecencio McLes Borden, MD;  Location: [facility];  Service: Urology;  Laterality: Left;  laser lithotripsy stone extraction on left   eye surgery  2012   [facility]   EYE SURGERY     4 RIGHT EYE SURGERIES FOR GLAUCOMA AND ONE  RT EYE CATARACT EXTRACTION; LEFT EYE CATARACT EXTRACTION WITH  I STENTPROCEDURE FOR GLAUCOMA   HOLMIUM LASER APPLICATION  10/01/2012   Procedure: HOLMIUM LASER APPLICATION;  Surgeon: Crecencio McLes Borden, MD;  Location: [facility];  Service: Urology;  Laterality: Left;   multiple orthopedic procedures     MYRINGOTOMY     SELECTIVE RHIZOTOMY PROCEDURE TO HELP REDUCE SPASTICITY AND CONTRACTURES      Family History  Problem Relation Age of Onset   Asthma Mother    Kidney Stones Mother    Osteoporosis Mother    Hypertension Mother    Hyperlipidemia Mother    Hyperlipidemia Father    Hodgkin's lymphoma Paternal Uncle    Alzheimer's disease Paternal Grandmother    Stroke Paternal Grandmother    Asthma Maternal Grandmother    Stroke Paternal Grandfather     Social History:  reports that he has never smoked. He has never used smokeless tobacco. He reports that he does not drink alcohol and does not use drugs.  Allergies:  Allergies  Allergen Reactions  Iodine Anaphylaxis    REACTION: sensitivity, Patient has never had a reaction. Patient's mother is concerned that because she is allergic to iodine that the patient may be allergic to iodine. Family hx   Afrin [Oxymetazoline] Other (See Comments)    Has Glaucoma.  Cannot take this medication.   Betadine [Povidone Iodine]     Unsure     Medications: Scheduled:  bisacodyl  10 mg Oral Daily   brimonidine-timolol  1 drop Both Eyes BID   enoxaparin (LOVENOX) injection  40 mg Subcutaneous Q24H   feeding supplement  237 mL Oral TID BM   multivitamin  15 mL Oral Daily   pantoprazole (PROTONIX) IV  40 mg Intravenous Q12H   polyethylene  glycol  17 g Oral Daily   sodium chloride flush  3 mL Intravenous Q12H   sorbitol, milk of mag, mineral oil, glycerin (SMOG) enema  200 mL Rectal Once   Continuous:  ampicillin-sulbactam (UNASYN) IV 3 g (06/24/21 0939)   lactated ringers 100 mL/hr at 06/24/21 0231    Results for orders placed or performed during the hospital encounter of 06/22/21 (from the past 24 hour(s))  CBC     Status: Abnormal   Collection Time: 06/24/21  4:37 AM  Result Value Ref Range   WBC 11.7 (H) 4.0 - 10.5 K/uL   RBC 3.62 (L) 4.22 - 5.81 MIL/uL   Hemoglobin 11.3 (L) 13.0 - 17.0 g/dL   HCT 73.2 (L) 20.2 - 54.2 %   MCV 91.7 80.0 - 100.0 fL   MCH 31.2 26.0 - 34.0 pg   MCHC 34.0 30.0 - 36.0 g/dL   RDW 70.6 23.7 - 62.8 %   Platelets 154 150 - 400 K/uL   nRBC 0.0 0.0 - 0.2 %  Comprehensive metabolic panel     Status: Abnormal   Collection Time: 06/24/21  4:37 AM  Result Value Ref Range   Sodium 138 135 - 145 mmol/L   Potassium 3.5 3.5 - 5.1 mmol/L   Chloride 106 98 - 111 mmol/L   CO2 24 22 - 32 mmol/L   Glucose, Bld 94 70 - 99 mg/dL   BUN 9 6 - 20 mg/dL   Creatinine, Ser 3.15 0.61 - 1.24 mg/dL   Calcium 8.0 (L) 8.9 - 10.3 mg/dL   Total Protein 5.5 (L) 6.5 - 8.1 g/dL   Albumin 2.6 (L) 3.5 - 5.0 g/dL   AST 10 (L) 15 - 41 U/L   ALT 12 0 - 44 U/L   Alkaline Phosphatase 54 38 - 126 U/L   Total Bilirubin 1.4 (H) 0.3 - 1.2 mg/dL   GFR, Estimated >17 >61 mL/min   Anion gap 8 5 - 15     CT CHEST W CONTRAST  Result Date: 06/23/2021 CLINICAL DATA:  Lung abscess EXAM: CT CHEST WITH CONTRAST TECHNIQUE: Multidetector CT imaging of the chest was performed during intravenous contrast administration. CONTRAST:  68mL OMNIPAQUE IOHEXOL 350 MG/ML SOLN COMPARISON:  Partial comparison with CT abdomen/pelvis dated 06/22/2021 FINDINGS: Motion degraded images. Cardiovascular: The heart is normal in size. No pericardial effusion. No evidence of thoracic aortic aneurysm. Mediastinum/Nodes: No suspicious mediastinal  lymphadenopathy. Visualized thyroid is unremarkable. Lungs/Pleura: Evaluation of the lung parenchyma is constrained by respiratory motion. 4.0 x 5.5 cm thick-walled cavitary lesion with internal fluid/gas in the left lower lobe (series 5/image 97). Surrounding ground-glass opacity in the left lower lobe. Additional mild patchy opacity in the superior segment left lower lobe (series 5/image 68). This overall appearance favors left  lobe pneumonia with lung abscess. Cavitary/necrotic neoplasm such as squamous cell carcinoma is considered unlikely, although follow-up is warranted to document resolution. Associated trace left pleural effusion.  No pneumothorax. Upper Abdomen: Visualized upper abdomen is grossly unremarkable. Musculoskeletal: Visualized osseous structures are within normal limits. IMPRESSION: 5.5 cm thick-walled cavitary lesion with fluid/gas in the left lower lobe, surrounding ground-glass opacity, and trace left pleural effusion. Overall appearance favors left lower lobe pneumonia with lung abscess. Follow-up CT chest is suggested in 6-12 weeks after appropriate antimicrobial therapy. Electronically Signed   By: Charline Bills M.D.   On: 06/23/2021 23:39   CT Abdomen Pelvis W Contrast  Result Date: 06/22/2021 CLINICAL DATA:  Fever and abdominal pain. EXAM: CT ABDOMEN AND PELVIS WITH CONTRAST TECHNIQUE: Multidetector CT imaging of the abdomen and pelvis was performed using the standard protocol following bolus administration of intravenous contrast. CONTRAST:  OMNIPAQUE IOHEXOL 300 MG/ML  SOLN COMPARISON:  Noncontrast CT 09/19/2012 FINDINGS: Lower chest: Small left pleural effusion. There is a left lower lobe consolidation measuring approximately 4.8 cm transverse. Peripherally enhancing fluid component measuring 3.4 cm suspicious for lung abscess, with tiny bubbles of air. There is adjacent ground-glass and ill-defined opacity. No visualized pulmonary emboli in the lung bases. Hepatobiliary:  No focal liver abnormality is seen. No gallstones, gallbladder wall thickening, or biliary dilatation. Pancreas: No ductal dilatation or inflammation. Spleen: Normal in size without focal abnormality.  Splenule medially Adrenals/Urinary Tract: No adrenal nodule. There is a 5 x 10 mm stone in the left proximal ureter (at the level of L3). Minimal proximal hydroureter but no hydronephrosis or obstructive changes. There is homogeneous enhancement and symmetric excretion on delayed phase imaging. Suspected nonobstructing stones in the lower right kidney are obscured by motion. Bilateral low-density lesions in both kidneys, larger lesions representing cysts, smaller lesions not well characterized due to motion and small size. Suspicious solid lesion. Minimally distended urinary bladder. No bladder wall thickening or stone. Stomach/Bowel: Lack of enteric contrast and motion limits detailed bowel assessment. Fluid within physiologically distended stomach. No small bowel obstruction or inflammation. Normal appendix. Air and small volume of stool in the colon. Sigmoid colon is redundant. No colonic inflammation. There is stool distending the rectum. Vascular/Lymphatic: Normal caliber abdominal aorta. Circumaortic left renal vein. Patent portal vein. No abdominopelvic adenopathy. Reproductive: Prostate is unremarkable. Other: Tiny fat containing umbilical hernia. No free air, free fluid, or intra-abdominal fluid collection. Musculoskeletal: There are no acute or suspicious osseous abnormalities. Dysplastic appearance of the right acetabulum. Postsurgical change in the right proximal femur. IMPRESSION: 1. There is a 5 x 10 mm stone in the left proximal ureter with minimal proximal hydroureter but no hydronephrosis or obstructive changes. Nonobstructing stones in the lower right kidney. 2. Left lower lobe consolidation in the included lung bases with peripherally enhancing fluid component measuring 3.4 cm with foci of internal  air suspicious for lung abscess. Small left pleural effusion. Possibility of necrotic neoplasm is also raised, but felt less likely. Recommend close radiographic follow-up after course of treatment to ensure resolution. 3. Bilateral renal cysts. Electronically Signed   By: Narda Rutherford M.D.   On: 06/22/2021 18:22   DG Chest Port 1 View  Result Date: 06/22/2021 CLINICAL DATA:  Abnormality seen on CT abdomen. Patient reports left-sided abdominal pain. EXAM: PORTABLE CHEST 1 VIEW COMPARISON:  Lung bases from abdominopelvic CT earlier today. Chest radiograph 02/03/2021 FINDINGS: Left basilar airspace disease corresponds to consolidation with fluid collection on CT. Trace left pleural effusion. There is  no other consolidation or focal airspace disease. No pneumothorax. Heart is normal in size with normal mediastinal contours. No pulmonary edema. No acute osseous abnormalities are seen. IMPRESSION: Left basilar airspace consolidation corresponds to airspace disease on CT. Trace left pleural effusion. Lungs are otherwise clear. Electronically Signed   By: Narda Rutherford M.D.   On: 06/22/2021 19:11    ROS:  As stated above in the HPI otherwise negative.  Blood pressure 101/62, pulse 86, temperature 98.4 F (36.9 C), temperature source Oral, resp. rate 16, height 5\' 9"  (1.753 m), weight 68 kg, SpO2 95 %.    PE: Gen: NAD, Alert and Oriented HEENT:  [place]/AT, EOMI Neck: Supple, no LAD Lungs: CTA Bilaterally CV: RRR without M/G/R ABD: Soft, NTND, +BS Ext: No C/C/E  Assessment/Plan: 1) Dysphagia - oropharyngeal versus esophageal. 2) LLL abscess. 3) Aspiration pneumonia.   His symptoms appear to be from an oropharyngeal source, but it is reasonable to perform an EGD to exclude any esophageal source.  The nature, goals, and potential complications of the EGD were discussed in detail with the patient's parents.  Plan: 1) EGD with dilation tomorrow. 2) Hold Lovenox.  Denetria Luevanos D 06/24/2021, 12:06  PM

## 2021-06-25 ENCOUNTER — Encounter (HOSPITAL_COMMUNITY)
Admission: EM | Disposition: A | Discharge status: Home / Self Care | Payer: Self-pay | Source: Home / Self Care | Attending: Internal Medicine

## 2021-06-25 ENCOUNTER — Encounter (HOSPITAL_COMMUNITY): Payer: Self-pay | Admitting: Family Medicine

## 2021-06-25 ENCOUNTER — Inpatient Hospital Stay (HOSPITAL_COMMUNITY): Payer: Medicare Other | Admitting: Anesthesiology

## 2021-06-25 HISTORY — PX: ESOPHAGOGASTRODUODENOSCOPY (EGD) WITH PROPOFOL: SHX5813

## 2021-06-25 HISTORY — PX: SAVORY DILATION: SHX5439

## 2021-06-25 SURGERY — ESOPHAGOGASTRODUODENOSCOPY (EGD) WITH PROPOFOL
Anesthesia: Monitor Anesthesia Care

## 2021-06-25 MED ORDER — PNEUMOCOCCAL 13-VAL CONJ VACC IM SUSP
0.5000 mL | INTRAMUSCULAR | Status: DC
  Filled 2021-06-25: qty 0.5

## 2021-06-25 MED ORDER — LIDOCAINE 2% (20 MG/ML) 5 ML SYRINGE
INTRAMUSCULAR | Status: DC | PRN
  Administered 2021-06-25: 60 mg via INTRAVENOUS

## 2021-06-25 MED ORDER — PANTOPRAZOLE SODIUM 40 MG PO TBEC: 40.0000 mg | DELAYED_RELEASE_TABLET | Freq: Two times a day (BID) | ORAL | Status: DC

## 2021-06-25 MED ORDER — PROPOFOL 10 MG/ML IV BOLUS
INTRAVENOUS | Status: DC | PRN
  Administered 2021-06-25: 20 mg via INTRAVENOUS

## 2021-06-25 MED ORDER — SODIUM CHLORIDE 0.9 % IV SOLN: INTRAVENOUS | Status: DC

## 2021-06-25 MED ORDER — PROPOFOL 500 MG/50ML IV EMUL
INTRAVENOUS | Status: DC | PRN
  Administered 2021-06-25: 100 ug/kg/min via INTRAVENOUS

## 2021-06-25 SURGICAL SUPPLY — 15 items

## 2021-06-25 NOTE — Transfer of Care (Signed)
Immediate Anesthesia Transfer of Care Note  Patient: Edward Alvarado  Procedure(s) Performed: ESOPHAGOGASTRODUODENOSCOPY (EGD) WITH PROPOFOL SAVORY DILATION  Patient Location: PACU and Endoscopy Unit  Anesthesia Type:MAC  Level of Consciousness: awake, drowsy and patient cooperative  Airway & Oxygen Therapy: Patient Spontanous Breathing and Patient connected to face mask oxygen  Post-op Assessment: Report given to RN and Post -op Vital signs reviewed and stable  Post vital signs: Reviewed and stable  Last Vitals:  Vitals Value Taken Time  BP    Temp    Pulse 86 06/25/21 1305  Resp 30 06/25/21 1305  SpO2 96 % 06/25/21 1305  Vitals shown include unvalidated device data.  Last Pain:  Vitals:   06/25/21 1212  TempSrc: Oral  PainSc:          Complications: No notable events documented.

## 2021-06-25 NOTE — Op Note (Signed)
[facility] Patient Name: Edward Alvarado Procedure Date: 06/25/2021 MRN: [id number] Attending MD: Jeani Hawking , MD Date of Birth: December 30, 1980 CSN: [id number] Age: 40 Admit Type: Inpatient Procedure:                Upper GI endoscopy Indications:              Dysphagia Providers:                Jeani Hawking, MD, Rogue Jury, RN, Harrington Challenger,                            Technician, Mickle Plumb, CRNA Referring MD:              Medicines:                Monitored Anesthesia Care Complications:            No immediate complications. Estimated Blood Loss:     Estimated blood loss: none. Procedure:                Pre-Anesthesia Assessment:                           - Prior to the procedure, a History and Physical                            was performed, and patient medications and                            allergies were reviewed. The patient's tolerance of                            previous anesthesia was also reviewed. The risks                            and benefits of the procedure and the sedation                            options and risks were discussed with the patient.                            All questions were answered, and informed consent                            was obtained. Prior Anticoagulants: The patient has                            taken no previous anticoagulant or antiplatelet                            agents. ASA Grade Assessment: III - A patient with                            severe systemic disease. After reviewing the risks  and benefits, the patient was deemed in                            satisfactory condition to undergo the procedure.                           - Sedation was administered by an anesthesia                            professional. Deep sedation was attained.                           After obtaining informed consent, the endoscope was                            passed under direct  vision. Throughout the                            procedure, the patient's blood pressure, pulse, and                            oxygen saturations were monitored continuously. The                            [id number]-[id number] ([id number] Olympus endoscope was introduced                            through the mouth, and advanced to the second part                            of duodenum. The upper GI endoscopy was                            accomplished without difficulty. The patient                            tolerated the procedure well. Findings:      No endoscopic abnormality was evident in the esophagus to explain the       patient's complaint of dysphagia. It was decided, however, to proceed       with dilation of the entire esophagus. A guidewire was placed and the       scope was withdrawn. Dilation was performed with a Savary dilator with       no resistance at 18 mm. The dilation site was examined following       endoscope reinsertion and showed no change.      The stomach was normal.      The examined duodenum was normal. Impression:               - No endoscopic esophageal abnormality to explain                            patient's dysphagia. Esophagus dilated. Dilated.                           -  Normal stomach.                           - Normal examined duodenum.                           - No specimens collected. Moderate Sedation:      Not Applicable - Patient had care per Anesthesia. Recommendation:           - Patient has a contact number available for                            emergencies. The signs and symptoms of potential                            delayed complications were discussed with the                            patient. Return to normal activities tomorrow.                            Written discharge instructions were provided to the                            patient.                           - Resume previous diet.                           - Continue present  medications.                           - Management of oropharyngeal dysphagia per Speech                            Pathology.                           - Signing off. Procedure Code(s):        --- Professional ---                           [id number], Esophagogastroduodenoscopy, flexible,                            transoral; with insertion of guide wire followed by                            passage of dilator(s) through esophagus over guide                            wire Diagnosis Code(s):        --- Professional ---                           R13.10, Dysphagia, unspecified CPT copyright 2019 [employer]. All rights reserved. The codes documented  in this report are preliminary and upon coder review may  be revised to meet current compliance requirements. Jeani Hawking, MD Jeani Hawking, MD 06/25/2021 12:59:40 PM This report has been signed electronically. Number of Addenda: 0

## 2021-06-25 NOTE — Anesthesia Procedure Notes (Signed)
Procedure Name: MAC Date/Time: 06/25/2021 12:38 PM Performed by: West Pugh, CRNA Pre-anesthesia Checklist: Patient identified, Emergency Drugs available, Suction available, Patient being monitored and Timeout performed Patient Re-evaluated:Patient Re-evaluated prior to induction Oxygen Delivery Method: Simple face mask Preoxygenation: Pre-oxygenation with 100% oxygen Placement Confirmation: positive ETCO2 Dental Injury: Teeth and Oropharynx as per pre-operative assessment

## 2021-06-25 NOTE — Care Management Important Message (Signed)
Important Message  Patient Details IM Letter given to the Patient. Name: Edward Alvarado MRN: [id number] Date of Birth: [birth year]   Medicare Important Message Given:  Yes     Caren Macadam 06/25/2021, 9:19 AM

## 2021-06-25 NOTE — Progress Notes (Signed)
PROGRESS NOTE    JACQUELYN ANTONY  LAG:[id number] DOB: 1981-07-02 DOA: 06/22/2021 PCP: Gladys Damme, MD  Brief Narrative: 40 yo male with a history of cerebral palsy wheelchair-bound complicated by deafness glaucoma scoliosis admitted with [facility] 102.[facility] found to have left lower lobe infiltrates concerning for abscess, leukocytosis, dehydration UA with ketonuria.  COVID-negative influenza negative. CT abdomen and pelvis-left lower lobe consolidation with peripherally enhancing fluid component measuring 3.4 cm with foci of internal air suspicious for lung abscess.  A 5 x 10 mm stone in the left proximal ureter with minimal proximal hydroureter but no hydronephrosis or obstructive changes also noted.  Nonobstructing stones in the lower right kidney also seen.   Portable chest x-ray showed left basilar airspace consolidation corresponding to airspace disease on CT. Assessment & Plan:   Principal Problem:   Sepsis ([place]) Active Problems:   Lung abscess ([place])   Left ureteral stone  #1 sepsis patient met criteria for sepsis on admission with fever 102.3 tachycardia and leukocytosis.  Sepsis likely secondary to left lower lung consolidation concern for silent aspiration/lung abscess. CT chest shows a 5.5 cm thick-walled cavitary lesion with fluid and gas in the left lower lobe surrounding groundglass opacity and trace left pleural effusion.  Follow-up chest CT suggested in 6 to 12 weeks after appropriate antimicrobial therapy. Continue Unasyn while he is in the hospital will DC on Augmentin for a total of 4 weeks.  Discussed with PCCM. Appreciate speech therapy input concern for silent aspiration. Leukocytosis improving he is on room air afebrile. Pneumonia vaccine  #2 left ureteral stone with no obstruction.  Follow-up with urology as outpatient.  No evidence of hydronephrosis.  #3 constipation stool softeners ordered.  Resolved.  #4 history of cerebral palsy wheelchair-bound  lives in group home.  #5 glaucoma continue eyedrops     Nutrition Problem: Inadequate oral intake Etiology: decreased appetite, acute illness (sepsis)     Signs/Symptoms: per patient/family report, meal completion < 25%    Interventions: Ensure Enlive (each supplement provides 350kcal and 20 grams of protein), Hormel Shake, MVI  Estimated body mass index is 22.15 kg/m as calculated from the following:   Height as of this encounter: $RemoveBeforeD'5\' 9"'[id number]$  (1.753 m).   Weight as of this encounter: 68 kg.  DVT prophylaxis: Lovenox Code Status: Full code Family Communication: Discussed with father in the room Disposition Plan:  Status is: Inpatient  Remains inpatient appropriate because:IV treatments appropriate due to intensity of illness or inability to take PO  Dispo: The patient is from: Group home              Anticipated d/c is to: Group home              Patient currently is not medically stable to d/c.   Difficult to place patient No       Consultants:  None  Procedures: None Antimicrobials Unasyn Subjective:  He is resting in bed family by the bedside.  Denies sob or abdominal pain Egd today   Objective: Vitals:   06/25/21 1212 06/25/21 1304 06/25/21 1310 06/25/21 1320  BP: (!) 141/94 101/60 116/70 119/80  Pulse: 95 85 86 81  Resp: 15 (!) 28 (!) 23 (!) 23  Temp: 99.6 F (37.6 C) 98.7 F (37.1 C)    TempSrc: Oral Oral    SpO2: 97% 96% 95% 97%  Weight:      Height:        Intake/Output Summary (Last 24 hours)  at 06/25/2021 1430 Last data filed at 06/25/2021 1306 Gross per 24 hour  Intake 450 ml  Output 250 ml  Net 200 ml    Filed Weights   06/22/21 1409  Weight: 68 kg    Examination:  General exam: Appears calm and comfortable  Respiratory system: Few rhonchi on the left side to auscultation. Respiratory effort normal. Cardiovascular system: S1 & S2 heard, RRR. No JVD, murmurs, rubs, gallops or clicks. No pedal edema. Gastrointestinal system:  Abdomen is nondistended, soft and nontender. No organomegaly or masses felt. Normal bowel sounds heard. Central nervous system: Alert and oriented. No focal neurological deficits. Extremities: Symmetric 5 x 5 power. Skin: No rashes, lesions or ulcers Psychiatry: Judgement and insight appear normal. Mood & affect appropriate.     Data Reviewed: I have personally reviewed following labs and imaging studies  CBC: Recent Labs  Lab 06/22/21 1627 06/23/21 0459 06/24/21 0437  WBC 22.8* 14.1* 11.7*  NEUTROABS 18.3*  --   --   HGB 15.3 12.2* 11.3*  HCT 44.3 35.0* 33.2*  MCV 91.0 91.6 91.7  PLT 222 150 568    Basic Metabolic Panel: Recent Labs  Lab 06/22/21 1627 06/23/21 0459 06/24/21 0437  NA 135 135 138  K 3.8 3.7 3.5  CL 99 105 106  CO2 $Re'23 22 24  'OqM$ GLUCOSE 139* 96 94  BUN $Re'19 13 9  'XmW$ CREATININE 0.97 0.74 0.78  CALCIUM 9.3 8.3* 8.0*  MG  --  1.7  --     GFR: Estimated Creatinine Clearance: 116.9 mL/min (by C-G formula based on SCr of 0.78 mg/dL). Liver Function Tests: Recent Labs  Lab 06/22/21 1627 06/24/21 0437  AST 20 10*  ALT 19 12  ALKPHOS 81 54  BILITOT 1.9* 1.4*  PROT 8.1 5.5*  ALBUMIN 4.0 2.6*    Recent Labs  Lab 06/22/21 1627  LIPASE 25    No results for input(s): AMMONIA in the last 168 hours. Coagulation Profile: No results for input(s): INR, PROTIME in the last 168 hours. Cardiac Enzymes: No results for input(s): CKTOTAL, CKMB, CKMBINDEX, TROPONINI in the last 168 hours. BNP (last 3 results) No results for input(s): PROBNP in the last 8760 hours. HbA1C: No results for input(s): HGBA1C in the last 72 hours. CBG: No results for input(s): GLUCAP in the last 168 hours. Lipid Profile: No results for input(s): CHOL, HDL, LDLCALC, TRIG, CHOLHDL, LDLDIRECT in the last 72 hours. Thyroid Function Tests: No results for input(s): TSH, T4TOTAL, FREET4, T3FREE, THYROIDAB in the last 72 hours. Anemia Panel: No results for input(s): VITAMINB12, FOLATE,  FERRITIN, TIBC, IRON, RETICCTPCT in the last 72 hours. Sepsis Labs: Recent Labs  Lab 06/22/21 1657 06/23/21 0459  PROCALCITON  --  0.37  LATICACIDVEN 1.4  --      Recent Results (from the past 240 hour(s))  Culture, blood (routine x 2)     Status: None (Preliminary result)   Collection Time: 06/22/21  4:57 PM   Specimen: Left Antecubital; Blood  Result Value Ref Range Status   Specimen Description   Final    LEFT ANTECUBITAL Performed at [facility], [address] [place], [place] [address]    Special Requests   Final    BOTTLES DRAWN AEROBIC AND ANAEROBIC Blood Culture adequate volume Performed at [facility], [address] [place], [place] [address]    Culture   Final    NO GROWTH 2 DAYS Performed at [facility], [place]  [address] [place], [place] [address]    Report Status PENDING  Incomplete  Resp Panel by RT-PCR (Flu A&B, Covid) Nasopharyngeal Swab     Status: None   Collection Time: 06/22/21  6:18 PM   Specimen: Nasopharyngeal Swab; Nasopharyngeal(NP) swabs in vial transport medium  Result Value Ref Range Status   SARS Coronavirus 2 by RT PCR NEGATIVE NEGATIVE Final    Comment: (NOTE) SARS-CoV-2 target nucleic acids are NOT DETECTED.  The SARS-CoV-2 RNA is generally detectable in upper respiratory specimens during the acute phase of infection. The lowest concentration of SARS-CoV-2 viral copies this assay can detect is 138 copies/mL. A negative result does not preclude SARS-Cov-2 infection and should not be used as the sole basis for treatment or other patient management decisions. A negative result may occur with  improper specimen collection/handling, submission of specimen other than nasopharyngeal swab, presence of viral mutation(s) within the areas targeted by this assay, and inadequate number of viral copies(<138 copies/mL). A negative result must be combined with clinical observations, patient history, and  epidemiological information. The expected result is Negative.  Fact Sheet for Patients:  EntrepreneurPulse.com.au  Fact Sheet for Healthcare Providers:  IncredibleEmployment.be  This test is no t yet approved or cleared by the [place] FDA and  has been authorized for detection and/or diagnosis of SARS-CoV-2 by FDA under an Emergency Use Authorization (EUA). This EUA will remain  in effect (meaning this test can be used) for the duration of the COVID-19 declaration under Section 564(b)(1) of the Act, 21 U.S.C.section 360bbb-3(b)(1), unless the authorization is terminated  or revoked sooner.       Influenza A by PCR NEGATIVE NEGATIVE Final   Influenza B by PCR NEGATIVE NEGATIVE Final    Comment: (NOTE) The Xpert Xpress SARS-CoV-2/FLU/RSV plus assay is intended as an aid in the diagnosis of influenza from Nasopharyngeal swab specimens and should not be used as a sole basis for treatment. Nasal washings and aspirates are unacceptable for Xpert Xpress SARS-CoV-2/FLU/RSV testing.  Fact Sheet for Patients: EntrepreneurPulse.com.au  Fact Sheet for Healthcare Providers: IncredibleEmployment.be  This test is not yet approved or cleared by the [place] FDA and has been authorized for detection and/or diagnosis of SARS-CoV-2 by FDA under an Emergency Use Authorization (EUA). This EUA will remain in effect (meaning this test can be used) for the duration of the COVID-19 declaration under Section 564(b)(1) of the Act, 21 U.S.C. section 360bbb-3(b)(1), unless the authorization is terminated or revoked.  Performed at [facility], [address] [place], [place] [address]   MRSA Next Gen by PCR, Nasal     Status: None   Collection Time: 06/23/21  6:48 AM   Specimen: Nasal Mucosa; Nasal Swab  Result Value Ref Range Status   MRSA by PCR Next Gen NOT DETECTED NOT DETECTED Final    Comment:  (NOTE) The GeneXpert MRSA Assay (FDA approved for NASAL specimens only), is one component of a comprehensive MRSA colonization surveillance program. It is not intended to diagnose MRSA infection nor to guide or monitor treatment for MRSA infections. Test performance is not FDA approved in patients less than 45 years old. Performed at [facility], [place] [address] [place], [place] [address]           Radiology Studies: CT CHEST W CONTRAST  Result Date: 06/23/2021 CLINICAL DATA:  Lung abscess EXAM: CT CHEST WITH CONTRAST TECHNIQUE: Multidetector CT imaging of the chest was performed during intravenous contrast administration. CONTRAST:  48mL  OMNIPAQUE IOHEXOL 350 MG/ML SOLN COMPARISON:  Partial comparison with CT abdomen/pelvis dated 06/22/2021 FINDINGS: Motion degraded images. Cardiovascular: The heart is normal in size. No pericardial effusion. No evidence of thoracic aortic aneurysm. Mediastinum/Nodes: No suspicious mediastinal lymphadenopathy. Visualized thyroid is unremarkable. Lungs/Pleura: Evaluation of the lung parenchyma is constrained by respiratory motion. 4.0 x 5.5 cm thick-walled cavitary lesion with internal fluid/gas in the left lower lobe (series 5/image 97). Surrounding ground-glass opacity in the left lower lobe. Additional mild patchy opacity in the superior segment left lower lobe (series 5/image 68). This overall appearance favors left lobe pneumonia with lung abscess. Cavitary/necrotic neoplasm such as squamous cell carcinoma is considered unlikely, although follow-up is warranted to document resolution. Associated trace left pleural effusion.  No pneumothorax. Upper Abdomen: Visualized upper abdomen is grossly unremarkable. Musculoskeletal: Visualized osseous structures are within normal limits. IMPRESSION: 5.5 cm thick-walled cavitary lesion with fluid/gas in the left lower lobe, surrounding ground-glass opacity, and trace left pleural effusion. Overall  appearance favors left lower lobe pneumonia with lung abscess. Follow-up CT chest is suggested in 6-12 weeks after appropriate antimicrobial therapy. Electronically Signed   By: Julian Hy M.D.   On: 06/23/2021 23:39        Scheduled Meds:  bisacodyl  10 mg Oral Daily   brimonidine-timolol  1 drop Both Eyes BID   feeding supplement  237 mL Oral TID BM   multivitamin  15 mL Oral Daily   pantoprazole (PROTONIX) IV  40 mg Intravenous Q12H   polyethylene glycol  17 g Oral Daily   sodium chloride flush  3 mL Intravenous Q12H   sorbitol, milk of mag, mineral oil, glycerin (SMOG) enema  200 mL Rectal Once   Continuous Infusions:  ampicillin-sulbactam (UNASYN) IV 3 g (06/25/21 1408)   lactated ringers 100 mL/hr at 06/25/21 1237     LOS: 3 days    Time spent: 61 min    Georgette Shell, MD 06/25/2021, 2:30 PM

## 2021-06-25 NOTE — Progress Notes (Signed)
  Speech Language Pathology Treatment: Dysphagia  Patient Details Name: Edward Alvarado MRN: [id number] DOB: July 09, 1981 Today's Date: 06/25/2021 Time: 2951-8841 SLP Time Calculation (min) (ACUTE ONLY): 16 min  Assessment / Plan / Recommendation Clinical Impression  Followed up with parents after EDG today with following result:  "no endoscopic abnormality was evident in the esophagus to explain the patient's complaint of dysphagia. It was decided, however, to proceed with dilation of the entire esophagus".  Discussed recommendations from ST standpoint with parents as pt sleeping soundly after procedure. Continue regular texture (meats may need to be chopped), thin liquids, pills whole in puree, slow rate, thorough mastication and no straws. If pt develops pneumonia an MBS can be performed as outpatient.    HPI HPI: 40 yo male with a PMH of cerebral palsy who is wheelchair-bound, deafness, scoliosis, glaucoma, and nephrolithiasis who presents with sepsis 2/2 L lower lung abscess and L proximal ureteral stone.   Swallow evaluation ordered. Pt has h/o dysphagia per MBS 2008.  Pt's mother reported pt did not like the thickened liquids recommended post"MBS 2008 and pt stopped using it.  She also reports he has started using straws more but was advised against it previously.  Mother and father report pt's po intake varies - at times he will not eat. Per notes, family report - pt has been coughing and vomiting after intake today. At times prior to admit, he would hyper salivate, cough and nearly pass out or become excesively fatigued.  in the past. Delayed coughing can also be presents after meals/po per family - causing SLP to suspect multifactorial dysphagia *kyphosis/scoliosis potentialy contributing to esophageal* and CP contributing to oropharyngeal deficits.      SLP Plan  Other (Comment) (no f/u)       Recommendations  Diet recommendations: Regular;Thin liquid Liquids provided via: Cup;No  straw Medication Administration: Whole meds with puree Supervision: Full supervision/cueing for compensatory strategies Compensations: Slow rate;Small sips/bites Postural Changes and/or Swallow Maneuvers: Seated upright 90 degrees;Upright 30-60 min after meal                Oral Care Recommendations: Oral care BID Follow up Recommendations: None SLP Visit Diagnosis: Dysphagia, oropharyngeal phase (R13.12) Plan: Other (Comment) (no f/u)       GO                Royce Macadamia 06/25/2021, 5:53 PM

## 2021-06-25 NOTE — Anesthesia Preprocedure Evaluation (Signed)
Anesthesia Evaluation  Patient identified by MRN, date of birth, ID band Patient awake    Reviewed: Allergy & Precautions, H&P , NPO status , Patient's Chart, lab work & pertinent test results  History of Anesthesia Complications Negative for: history of anesthetic complications  Airway Mallampati: III  TM Distance: >3 FB Neck ROM: Full    Dental  (+) Teeth Intact, Dental Advisory Given   Pulmonary neg pulmonary ROS,    Pulmonary exam normal breath sounds clear to auscultation       Cardiovascular (-) CAD and (-) Past MI Normal cardiovascular exam Rhythm:Regular Rate:Normal     Neuro/Psych Cerebral palsy. Sensation intact in LE but no motor.   Neuromuscular disease negative psych ROS   GI/Hepatic negative GI ROS, Neg liver ROS,   Endo/Other  negative endocrine ROS  Renal/GU negative Renal ROS     Musculoskeletal  (+) Arthritis , Osteoarthritis,    Abdominal   Peds  Hematology negative hematology ROS (+)   Anesthesia Other Findings Complication of anesthesia    JAN 2012 EYE SURGERY AT [facility] / OXYGENATION PROBLEMS.  PT HAD SUBSEQUENT SURGERIES  AFTER JAN 2012 AT [facility] AND PARENTS TOLD ET USED AND PT DID FINE.  PT HAD NO ANESTHESIA PROBLEMS WITH SURGERIES PRIOR TO THE JAN 2012 SURGERY.    Reproductive/Obstetrics                            Anesthesia Physical  Anesthesia Plan  ASA: 3  Anesthesia Plan: MAC   Post-op Pain Management:    Induction: Intravenous  PONV Risk Score and Plan: 1 and Propofol infusion  Airway Management Planned:   Additional Equipment:   Intra-op Plan:   Post-operative Plan: Extubation in OR  Informed Consent: I have reviewed the patients History and Physical, chart, labs and discussed the procedure including the risks, benefits and alternatives for the proposed anesthesia with the patient or authorized  representative who has indicated his/her understanding and acceptance.     Dental advisory given  Plan Discussed with: CRNA  Anesthesia Plan Comments:        Anesthesia Quick Evaluation

## 2021-06-25 NOTE — TOC Progression Note (Signed)
Transition of Care [facility]) - Progression Note    Patient Details  Name: Edward Alvarado MRN: [id number] Date of Birth: 09/17/1981  [facility] CM/SW Contact  Rilee Knoll, Olegario Messier, RN Phone Number: 06/25/2021, 12:07 PM  Clinical Narrative: Per patient's mother [name]'s request I contacted Letithia([facility]-director)[phone] about d/c plans-patient will d/c to parents home-address on demographic sheet-family has own transportation. Then may go to [facility] when appropriate. Parents has elected Letithia to be contact person when they are out of town. No further CM needs.     Expected Discharge Plan: Home/Self Care Barriers to Discharge: Continued Medical Work up  Expected Discharge Plan and Services Expected Discharge Plan: Home/Self Care                                               Social Determinants of Health (SDOH) Interventions    Readmission Risk Interventions No flowsheet data found.

## 2021-06-25 NOTE — Progress Notes (Signed)
The patient is receiving Protonix by the intravenous route.  Based on criteria approved by the Pharmacy and Therapeutics Committee and the Medical Executive Committee, the medication is being converted to the equivalent oral dose form.  These criteria include: -No active GI bleeding -Able to tolerate diet of full liquids (or better) or tube feeding -Able to tolerate other medications by the oral or enteral route  If you have any questions about this conversion, please contact the Pharmacy Department (phone [phone]).  Thank you.   Consider discontinuing protonix  Herby Abraham, Pharm.D [phone]/10/2021 2:46 PM

## 2021-06-26 LAB — CBC
HCT: 33.1 % — ABNORMAL LOW (ref 39.0–52.0)
Hemoglobin: 11.1 g/dL — ABNORMAL LOW (ref 13.0–17.0)
MCH: 30.5 pg (ref 26.0–34.0)
MCHC: 33.5 g/dL (ref 30.0–36.0)
MCV: 90.9 fL (ref 80.0–100.0)
Platelets: 195 10*3/uL (ref 150–400)
RBC: 3.64 MIL/uL — ABNORMAL LOW (ref 4.22–5.81)
RDW: 11.9 % (ref 11.5–15.5)
WBC: 9.3 10*3/uL (ref 4.0–10.5)
nRBC: 0 % (ref 0.0–0.2)

## 2021-06-26 LAB — COMPREHENSIVE METABOLIC PANEL
ALT: 21 U/L (ref 0–44)
AST: 19 U/L (ref 15–41)
Albumin: 2.4 g/dL — ABNORMAL LOW (ref 3.5–5.0)
Alkaline Phosphatase: 52 U/L (ref 38–126)
Anion gap: 8 (ref 5–15)
BUN: 8 mg/dL (ref 6–20)
CO2: 22 mmol/L (ref 22–32)
Calcium: 8.1 mg/dL — ABNORMAL LOW (ref 8.9–10.3)
Chloride: 107 mmol/L (ref 98–111)
Creatinine, Ser: 0.76 mg/dL (ref 0.61–1.24)
GFR, Estimated: >60 mL/min (ref >60)
Glucose, Bld: 96 mg/dL (ref 70–99)
Potassium: 3.5 mmol/L (ref 3.5–5.1)
Sodium: 137 mmol/L (ref 135–145)
Total Bilirubin: 0.7 mg/dL (ref 0.3–1.2)
Total Protein: 5.5 g/dL — ABNORMAL LOW (ref 6.5–8.1)

## 2021-06-26 MED ORDER — HYDROCODONE BIT-HOMATROP MBR 5-1.5 MG/5ML PO SOLN: 5.0000 mL | Freq: Four times a day (QID) | ORAL | 0 refills | Status: DC | PRN

## 2021-06-26 MED ORDER — PNEUMOCOCCAL VAC POLYVALENT 25 MCG/0.5ML IJ INJ
0.5000 mL | INJECTION | Freq: Once | INTRAMUSCULAR | Status: AC
  Administered 2021-06-26: 0.5 mL via INTRAMUSCULAR
  Filled 2021-06-26: qty 0.5

## 2021-06-26 MED ORDER — SACCHAROMYCES BOULARDII 250 MG PO CAPS: 250.0000 mg | ORAL_CAPSULE | Freq: Two times a day (BID) | ORAL | 1 refills | Status: DC

## 2021-06-26 MED ORDER — AMOXICILLIN-POT CLAVULANATE 875-125 MG PO TABS: 1.0000 | ORAL_TABLET | Freq: Two times a day (BID) | ORAL | 0 refills | Status: AC

## 2021-06-26 MED ORDER — ACETAMINOPHEN 325 MG PO TABS: 650.0000 mg | ORAL_TABLET | ORAL | 2 refills | Status: AC | PRN

## 2021-06-26 MED ORDER — POLYETHYLENE GLYCOL 3350 17 G PO PACK: 17.0000 g | PACK | Freq: Every day | ORAL | 0 refills | Status: AC | PRN

## 2021-06-26 MED ORDER — GUAIFENESIN ER 600 MG PO TB12: 600.0000 mg | ORAL_TABLET | Freq: Two times a day (BID) | ORAL | Status: DC | PRN

## 2021-06-26 MED ORDER — SACCHAROMYCES BOULARDII 250 MG PO CAPS: 250.0000 mg | ORAL_CAPSULE | Freq: Two times a day (BID) | ORAL | Status: DC

## 2021-06-26 NOTE — Progress Notes (Signed)
Patient and his parents given discharge, follow up, and medication instructions, verbalized understanding, IV removed, personal belongings with patient, parents to transport home

## 2021-06-26 NOTE — Plan of Care (Signed)
  Problem: Clinical Measurements: Goal: Will remain free from infection Outcome: Progressing Goal: Respiratory complications will improve Outcome: Progressing   Problem: Fluid Volume: Goal: Hemodynamic stability will improve Outcome: Progressing

## 2021-06-26 NOTE — Discharge Summary (Signed)
Physician Discharge Summary  Edward Alvarado EMV:[id number] DOB: [birth year] DOA: 06/22/2021  PCP: Gladys Damme, MD  Admit date: 06/22/2021 Discharge date: 06/26/2021  Admitted From: Group group disposition: Group home  Recommendations for Outpatient Follow-up:  Follow up with PCP in 1-2 weeks Please obtain BMP/CBC in one week Please note he needs CT chest with contrast repeated in 6 weeks to follow-up on the lung abscess and resolution of the abscess and pneumonia.   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  COVID-negative influenza negative. CT abdomen and pelvis-left lower lobe consolidation with peripherally enhancing fluid component measuring 3.4 cm with foci of internal air suspicious for lung abscess.  A 5 x 10 mm stone in the left proximal ureter with minimal proximal hydroureter but no hydronephrosis or obstructive changes also noted.  Nonobstructing stones in the lower right kidney also seen.   Portable chest x-ray showed left basilar airspace consolidation corresponding to airspace disease on CT.  Discharge Diagnoses:  Principal Problem:   Sepsis ([place]) Active Problems:   Lung abscess ([place])   Left ureteral stone  #1 sepsis patient met criteria for sepsis on admission with fever 102.3 tachycardia and leukocytosis.  Sepsis likely secondary to left lower lung consolidation concern for silent aspiration/lung abscess. CT chest shows a 5.5 cm thick-walled cavitary lesion with fluid and gas in the left lower lobe surrounding groundglass opacity and trace left pleural effusion.  Follow-up chest CT suggested in 6 to  12 weeks after appropriate antimicrobial therapy. Was treated with Unasyn during the hospital stay.  He was discharged on Augmentin for 24 days to finish a course of 4 weeks.  Patient was seen by speech therapy concern for silent aspiration.  He was afebrile leukocytosis had resolved prior to discharge.  He also received Pneumovax prior to DC.  Discussed with PCCM.  He was seen by GI Dr. Benson Norway had EGD and had a esophagus stretched his esophagus stomach and duodenum was normal.  #2 left ureteral stone with no obstruction.  No evidence of hydronephrosis.   #3 constipation  Resolved.   #4 history of cerebral palsy wheelchair-bound lives in group home.   #5 glaucoma continue eyedrops        Nutrition Problem: Inadequate oral intake Etiology: decreased appetite, acute illness (sepsis)    Signs/Symptoms: per patient/family report, meal completion < 25%     Interventions: Ensure Enlive (each supplement provides 350kcal and 20 grams of protein), Hormel Shake, MVI  Estimated body mass index is 22.15 kg/m as calculated from the following:   Height as of this encounter: _0  (1.753 m).   Weight as of this encounter: 68 kg.  Discharge Instructions  Discharge Instructions     Diet - low sodium heart healthy   Complete by: As directed    Increase activity slowly   Complete by: As directed       Allergies as of 06/26/2021       Reactions   Iodine Anaphylaxis   REACTION: sensitivity, Patient has never had a reaction. Patient's mother is concerned that because she is allergic to iodine that the patient may be allergic to iodine. Family hx   Afrin [oxymetazoline] Other (See Comments)  Has Glaucoma.  Cannot take this medication.   Betadine [povidone Iodine]    Unsure        Medication List     TAKE these medications    acetaminophen 325 MG tablet Commonly known as: Tylenol Take 2 tablets (650 mg total) by mouth every 4 (four) hours as needed.   amoxicillin-clavulanate  875-125 MG tablet Commonly known as: Augmentin Take 1 tablet by mouth 2 (two) times daily for 24 days.   benzonatate 100 MG capsule Commonly known as: TESSALON Take 1 capsule (100 mg total) by mouth 2 (two) times daily as needed for cough.   cholecalciferol 25 MCG (1000 UNIT) tablet Commonly known as: VITAMIN D TAKE 1 TABLET BY MOUTH ONCE DAILY What changed:  how much to take how to take this when to take this additional instructions   Combigan 0.2-0.5 % ophthalmic solution Generic drug: brimonidine-timolol INSTILL ONE DROP INTO EACH EYE 2 TIMES DAILY What changed: See the new instructions.   Debrox 6.5 % OTIC solution Generic drug: carbamide peroxide Place 5 drops into both ears once a week. What changed:  when to take this additional instructions   eucerin cream Apply topically as needed (dry skin). Generic okay What changed: how much to take   fluticasone 50 MCG/ACT nasal spray Commonly known as: FLONASE USE 2 SPRAYS in EACH NOSTRIL ONCE DAILY   guaiFENesin 600 MG 12 hr tablet Commonly known as: MUCINEX Take 1 tablet (600 mg total) by mouth 2 (two) times daily as needed for cough or to loosen phlegm.   HYDROcodone bit-homatropine 5-1.5 MG/5ML syrup Commonly known as: HYCODAN Take 5 mLs by mouth every 6 (six) hours as needed for cough.   Metamucil Premium Blend 52.63 % Powd Generic drug: Psyllium Take 1 Scoop by mouth daily. Take 1 tsp of metamucil in 4 oz of water daily to titrate to 1 bowel movement daily. If more than one BM daily, can increase to 2 tsp in 8 oz of water. If patient is sick, do not give. What changed:  when to take this reasons to take this   montelukast 10 MG tablet Commonly known as: SINGULAIR TAKE 1 TABLET BY MOUTH AT BEDTIME   multivitamin Tabs tablet Take 1 tablet by mouth daily.   ondansetron 4 MG tablet Commonly known as: Zofran Take 1 tablet (4 mg total) by mouth every 8 (eight) hours as needed for nausea or vomiting.    polyethylene glycol 17 g packet Commonly known as: MiraLax Take 17 g by mouth daily as needed.   saccharomyces boulardii 250 MG capsule Commonly known as: FLORASTOR Take 1 capsule (250 mg total) by mouth 2 (two) times daily.   sodium chloride 0.65 % Soln nasal spray Commonly known as: OCEAN Place 1 spray into both nostrils as needed for congestion. As needed for allergy symptoms What changed:  when to take this reasons to take this additional instructions        Follow-up Information     Gladys Damme, MD Follow up.   Specialty: Family Medicine Contact information: [address] [phone]                Allergies  Allergen Reactions   Iodine Anaphylaxis    REACTION: sensitivity, Patient has never had a reaction. Patient's mother is concerned that because she is allergic to iodine that the patient may be allergic to iodine. Family hx   Afrin [Oxymetazoline] Other (See Comments)    Has Glaucoma.  Cannot take this medication.   Betadine [Povidone Iodine]     Unsure     Consultations: Discussed with PCCM over the phone Seen by GI Dr. Benson Norway for EGD   Procedures/Studies: CT CHEST W CONTRAST  Result Date: 06/23/2021 CLINICAL DATA:  Lung abscess EXAM: CT CHEST WITH CONTRAST TECHNIQUE: Multidetector CT imaging of the chest was performed during intravenous contrast administration. CONTRAST:  84m OMNIPAQUE IOHEXOL 350 MG/ML SOLN COMPARISON:  Partial comparison with CT abdomen/pelvis dated 06/22/2021 FINDINGS: Motion degraded images. Cardiovascular: The heart is normal in size. No pericardial effusion. No evidence of thoracic aortic aneurysm. Mediastinum/Nodes: No suspicious mediastinal lymphadenopathy. Visualized thyroid is unremarkable. Lungs/Pleura: Evaluation of the lung parenchyma is constrained by respiratory motion. 4.0 x 5.5 cm thick-walled cavitary lesion with internal fluid/gas in the left lower lobe (series 5/image 97).  Surrounding ground-glass opacity in the left lower lobe. Additional mild patchy opacity in the superior segment left lower lobe (series 5/image 68). This overall appearance favors left lobe pneumonia with lung abscess. Cavitary/necrotic neoplasm such as squamous cell carcinoma is considered unlikely, although follow-up is warranted to document resolution. Associated trace left pleural effusion.  No pneumothorax. Upper Abdomen: Visualized upper abdomen is grossly unremarkable. Musculoskeletal: Visualized osseous structures are within normal limits. IMPRESSION: 5.5 cm thick-walled cavitary lesion with fluid/gas in the left lower lobe, surrounding ground-glass opacity, and trace left pleural effusion. Overall appearance favors left lower lobe pneumonia with lung abscess. Follow-up CT chest is suggested in 6-12 weeks after appropriate antimicrobial therapy. Electronically Signed   By: SJulian HyM.D.   On: 06/23/2021 23:39   CT Abdomen Pelvis W Contrast  Result Date: 06/22/2021 CLINICAL DATA:  Fever and abdominal pain. EXAM: CT ABDOMEN AND PELVIS WITH CONTRAST TECHNIQUE: Multidetector CT imaging of the abdomen and pelvis was performed using the standard protocol following bolus administration of intravenous contrast. CONTRAST:  1020mOMNIPAQUE IOHEXOL 300 MG/ML  SOLN COMPARISON:  Noncontrast CT 09/19/2012 FINDINGS: Lower chest: Small left pleural effusion. There is a left lower lobe consolidation measuring approximately 4.8 cm transverse. Peripherally enhancing fluid component measuring 3.4 cm suspicious for lung abscess, with tiny bubbles of air. There is adjacent ground-glass and ill-defined opacity. No visualized pulmonary emboli in the lung bases. Hepatobiliary: No focal liver abnormality is seen. No gallstones, gallbladder wall thickening, or biliary dilatation. Pancreas: No ductal dilatation or inflammation. Spleen: Normal in size without focal abnormality.  Splenule medially Adrenals/Urinary Tract: No  adrenal nodule. There is a 5 x 10 mm stone in the left proximal ureter (at the level of L3). Minimal proximal hydroureter but no hydronephrosis or obstructive changes. There is homogeneous enhancement and symmetric excretion on delayed phase imaging. Suspected nonobstructing stones in the lower right kidney are obscured by motion. Bilateral low-density lesions in both kidneys, larger lesions representing cysts, smaller lesions not well characterized due to motion and small size. Suspicious solid lesion. Minimally distended urinary bladder. No bladder wall thickening or stone. Stomach/Bowel: Lack of enteric contrast and motion limits detailed bowel assessment. Fluid within physiologically distended stomach. No small bowel obstruction or inflammation. Normal appendix. Air and small volume of stool in the colon. Sigmoid colon is redundant. No colonic inflammation. There is stool distending the rectum. Vascular/Lymphatic: Normal caliber abdominal aorta. Circumaortic left renal vein. Patent portal vein. No abdominopelvic adenopathy. Reproductive: Prostate is unremarkable. Other: Tiny fat containing umbilical hernia. No free air, free fluid, or intra-abdominal fluid collection. Musculoskeletal: There are no acute or suspicious osseous abnormalities. Dysplastic appearance of the right acetabulum. Postsurgical change  in the right proximal femur. IMPRESSION: 1. There is a 5 x 10 mm stone in the left proximal ureter with minimal proximal hydroureter but no hydronephrosis or obstructive changes. Nonobstructing stones in the lower right kidney. 2. Left lower lobe consolidation in the included lung bases with peripherally enhancing fluid component measuring 3.4 cm with foci of internal air suspicious for lung abscess. Small left pleural effusion. Possibility of necrotic neoplasm is also raised, but felt less likely. Recommend close radiographic follow-up after course of treatment to ensure resolution. 3. Bilateral renal cysts.  Electronically Signed   By: Keith Rake M.D.   On: 06/22/2021 18:22   Cardiac event monitor  Result Date: 06/18/2021 Normal sinus rhythm Sinus bradycardia Brief sinus tachycardia No sustained arrhythmias No events reported  DG Chest Port 1 View  Result Date: 06/22/2021 CLINICAL DATA:  Abnormality seen on CT abdomen. Patient reports left-sided abdominal pain. EXAM: PORTABLE CHEST 1 VIEW COMPARISON:  Lung bases from abdominopelvic CT earlier today. Chest radiograph 02/03/2021 FINDINGS: Left basilar airspace disease corresponds to consolidation with fluid collection on CT. Trace left pleural effusion. There is no other consolidation or focal airspace disease. No pneumothorax. Heart is normal in size with normal mediastinal contours. No pulmonary edema. No acute osseous abnormalities are seen. IMPRESSION: Left basilar airspace consolidation corresponds to airspace disease on CT. Trace left pleural effusion. Lungs are otherwise clear. Electronically Signed   By: Keith Rake M.D.   On: 06/22/2021 19:11   ECHOCARDIOGRAM COMPLETE  Result Date: 05/28/2021    ECHOCARDIOGRAM REPORT   Patient Name:   DARREN CALDRON [facility] Date of Exam: 05/28/2021 Medical Rec #:  [id number]        Height:       69.0 in Accession #:    [id number]       Weight:       156.0 lb Date of Birth:  [birth year]        BSA:          1.859 m Patient Age:    41 years         BP:           129/84 mmHg Patient Gender: M                HR:           77 bpm. Exam Location:  [place] Procedure: 2D Echo, 3D Echo, Cardiac Doppler, Color Doppler and Strain Analysis Indications:    R55 Syncope and collapse  History:        Patient has no prior history of Echocardiogram examinations.                 Risk Factors:Dyslipidemia. Scoliosis.  Sonographer:    Wilford Sports Rodgers-Jones RDCS Referring Phys: [place]  1. Left ventricular ejection fraction, by estimation, is 60 to 65%. The left ventricle has normal function. The left  ventricle has no regional wall motion abnormalities. Left ventricular diastolic parameters were normal. The average left ventricular global longitudinal strain is -22.3 %. The global longitudinal strain is normal.  2. Right ventricular systolic function is mildly reduced. The right ventricular size is normal.  3. The mitral valve is normal in structure. Trivial mitral valve regurgitation. No evidence of mitral stenosis.  4. The aortic valve is normal in structure. Aortic valve regurgitation is not visualized. FINDINGS  Left Ventricle: Left ventricular ejection fraction, by estimation, is 60 to 65%. The left ventricle has normal function. The left ventricle has no  regional wall motion abnormalities. The average left ventricular global longitudinal strain is -22.3 %. The global longitudinal strain is normal. The left ventricular internal cavity size was normal in size. There is no left ventricular hypertrophy. Left ventricular diastolic parameters were normal. Right Ventricle: The right ventricular size is normal. Right vetricular wall thickness was not well visualized. Right ventricular systolic function is mildly reduced. Left Atrium: Left atrial size was normal in size. Right Atrium: Right atrial size was normal in size. Pericardium: There is no evidence of pericardial effusion. Mitral Valve: The mitral valve is normal in structure. Trivial mitral valve regurgitation. No evidence of mitral valve stenosis. Tricuspid Valve: The tricuspid valve is normal in structure. Tricuspid valve regurgitation is trivial. Aortic Valve: The aortic valve is normal in structure. Aortic valve regurgitation is not visualized. Pulmonic Valve: The pulmonic valve was grossly normal. Pulmonic valve regurgitation is not visualized. Aorta: The aortic root and ascending aorta are structurally normal, with no evidence of dilitation. IAS/Shunts: The atrial septum is grossly normal.  LEFT VENTRICLE PLAX 2D LVIDd:         3.90 cm  Diastology  LVIDs:         2.50 cm  LV e' medial:    7.40 cm/s LV PW:         0.90 cm  LV E/e' medial:  9.1 LV IVS:        0.90 cm  LV e' lateral:   8.81 cm/s LVOT diam:     2.30 cm  LV E/e' lateral: 7.7 LV SV:         60 LV SV Index:   32       2D Longitudinal Strain LVOT Area:     4.15 cm 2D Strain GLS (A2C):   -20.0 %                         2D Strain GLS (A3C):   -22.7 %                         2D Strain GLS (A4C):   -24.2 %                         2D Strain GLS Avg:     -22.3 %                          3D Volume EF:                         3D EF:        60 %                         LV EDV:       67 ml                         LV ESV:       27 ml                         LV SV:        40 ml RIGHT VENTRICLE             IVC RV Basal diam:  3.60 cm     IVC diam: 1.40 cm RV  S prime:     12.10 cm/s TAPSE (M-mode): 1.6 cm LEFT ATRIUM             Index       RIGHT ATRIUM          Index LA diam:        3.40 cm 1.83 cm/m  RA Area:     9.37 cm LA Vol (A2C):   23.0 ml 12.37 ml/m RA Volume:   21.50 ml 11.57 ml/m LA Vol (A4C):   24.2 ml 13.02 ml/m LA Biplane Vol: 24.2 ml 13.02 ml/m  AORTIC VALVE LVOT Vmax:   75.90 cm/s LVOT Vmean:  48.050 cm/s LVOT VTI:    0.144 m  AORTA Ao Root diam: 3.80 cm Ao Asc diam:  2.90 cm MITRAL VALVE MV Area (PHT): 4.06 cm    SHUNTS MV Decel Time: 187 msec    Systemic VTI:  0.14 m MV E velocity: 67.70 cm/s  Systemic Diam: 2.30 cm MV A velocity: 69.40 cm/s MV E/A ratio:  0.98 Mertie Moores MD Electronically signed by Mertie Moores MD Signature Date/Time: 05/28/2021/1:40:32 PM    Final    (Echo, Carotid, EGD, Colonoscopy, ERCP)    Subjective:   Discharge Exam: Vitals:   06/26/21 0450 06/26/21 1300  BP: 107/72 110/76  Pulse: 62 61  Resp: 18 18  Temp: 98.9 F (37.2 C) 98.7 F (37.1 C)  SpO2: 96% 96%   Vitals:   06/25/21 1440 06/25/21 2020 06/26/21 0450 06/26/21 1300  BP: 121/87 122/76 107/72 110/76  Pulse: 84 89 62 61  Resp: _0 Temp: 98.1 F (36.7 C) 98.8 F (37.1 C) 98.9 F  (37.2 C) 98.7 F (37.1 C)  TempSrc:  Oral Oral Oral  SpO2: 97% 96% 96% 96%  Weight:      Height:        General: Pt is alert, awake, not in acute distress Cardiovascular: RRR, S1/S2 +, no rubs, no gallops Respiratory: CTA bilaterally, no wheezing, no rhonchi Abdominal: Soft, NT, ND, bowel sounds + Extremities: no edema, no cyanosis    The results of significant diagnostics from this hospitalization (including imaging, microbiology, ancillary and laboratory) are listed below for reference.     Microbiology: Recent Results (from the past 240 hour(s))  Culture, blood (routine x 2)     Status: None (Preliminary result)   Collection Time: 06/22/21  4:57 PM   Specimen: Left Antecubital; Blood  Result Value Ref Range Status   Specimen Description   Final    LEFT ANTECUBITAL Performed at [facility], [address] [place], [place] [address]    Special Requests   Final    BOTTLES DRAWN AEROBIC AND ANAEROBIC Blood Culture adequate volume Performed at [facility], [address] [place], [place] [address]    Culture   Final    NO GROWTH 4 DAYS Performed at [facility], [place] [address] [place], [place] [address]    Report Status PENDING  Incomplete  Resp Panel by RT-PCR (Flu A&B, Covid) Nasopharyngeal Swab     Status: None   Collection Time: 06/22/21  6:18 PM   Specimen: Nasopharyngeal Swab; Nasopharyngeal(NP) swabs in vial transport medium  Result Value Ref Range Status   SARS Coronavirus 2 by RT PCR NEGATIVE NEGATIVE Final    Comment: (NOTE) SARS-CoV-2 target nucleic acids are NOT DETECTED.  The SARS-CoV-2 RNA is generally detectable in upper respiratory specimens during the acute phase of  infection. The lowest concentration of SARS-CoV-2 viral copies this assay can detect is 138 copies/mL. A negative result does not preclude SARS-Cov-2 infection and should not be used as the sole basis for treatment or other patient management decisions. A  negative result may occur with  improper specimen collection/handling, submission of specimen other than nasopharyngeal swab, presence of viral mutation(s) within the areas targeted by this assay, and inadequate number of viral copies(<138 copies/mL). A negative result must be combined with clinical observations, patient history, and epidemiological information. The expected result is Negative.  Fact Sheet for Patients:  EntrepreneurPulse.com.au  Fact Sheet for Healthcare Providers:  IncredibleEmployment.be  This test is no t yet approved or cleared by the [place] FDA and  has been authorized for detection and/or diagnosis of SARS-CoV-2 by FDA under an Emergency Use Authorization (EUA). This EUA will remain  in effect (meaning this test can be used) for the duration of the COVID-19 declaration under Section 564(b)(1) of the Act, 21 U.S.C.section 360bbb-3(b)(1), unless the authorization is terminated  or revoked sooner.       Influenza A by PCR NEGATIVE NEGATIVE Final   Influenza B by PCR NEGATIVE NEGATIVE Final    Comment: (NOTE) The Xpert Xpress SARS-CoV-2/FLU/RSV plus assay is intended as an aid in the diagnosis of influenza from Nasopharyngeal swab specimens and should not be used as a sole basis for treatment. Nasal washings and aspirates are unacceptable for Xpert Xpress SARS-CoV-2/FLU/RSV testing.  Fact Sheet for Patients: EntrepreneurPulse.com.au  Fact Sheet for Healthcare Providers: IncredibleEmployment.be  This test is not yet approved or cleared by the [place] FDA and has been authorized for detection and/or diagnosis of SARS-CoV-2 by FDA under an Emergency Use Authorization (EUA). This EUA will remain in effect (meaning this test can be used) for the duration of the COVID-19 declaration under Section 564(b)(1) of the Act, 21 U.S.C. section 360bbb-3(b)(1), unless the authorization  is terminated or revoked.  Performed at [facility], [address] [place], [place] [address]   MRSA Next Gen by PCR, Nasal     Status: None   Collection Time: 06/23/21  6:48 AM   Specimen: Nasal Mucosa; Nasal Swab  Result Value Ref Range Status   MRSA by PCR Next Gen NOT DETECTED NOT DETECTED Final    Comment: (NOTE) The GeneXpert MRSA Assay (FDA approved for NASAL specimens only), is one component of a comprehensive MRSA colonization surveillance program. It is not intended to diagnose MRSA infection nor to guide or monitor treatment for MRSA infections. Test performance is not FDA approved in patients less than 73 years old. Performed at [facility], [place] [address] [place], [place] [address]      Labs: BNP (last 3 results) No results for input(s): BNP in the last 8760 hours. Basic Metabolic Panel: Recent Labs  Lab 06/22/21 1627 06/23/21 0459 06/24/21 0437 06/26/21 0548  NA 135 135 138 137  K 3.8 3.7 3.5 3.5  CL 99 105 106 107  CO2 _0 GLUCOSE 139* 96 94 96  BUN _1 CREATININE 0.97 0.74 0.78 0.76  CALCIUM 9.3 8.3* 8.0* 8.1*  MG  --  1.7  --   --    Liver Function Tests: Recent Labs  Lab 06/22/21 1627 06/24/21 0437 06/26/21 0548  AST 20 10* 19  ALT _2 ALKPHOS 81 54 52  BILITOT 1.9* 1.4* 0.7  PROT 8.1 5.5* 5.5*  ALBUMIN 4.0  2.6* 2.4*   Recent Labs  Lab 06/22/21 1627  LIPASE 25   No results for input(s): AMMONIA in the last 168 hours. CBC: Recent Labs  Lab 06/22/21 1627 06/23/21 0459 06/24/21 0437 06/26/21 0548  WBC 22.8* 14.1* 11.7* 9.3  NEUTROABS 18.3*  --   --   --   HGB 15.3 12.2* 11.3* 11.1*  HCT 44.3 35.0* 33.2* 33.1*  MCV 91.0 91.6 91.7 90.9  PLT 222 150 154 195   Cardiac Enzymes: No results for input(s): CKTOTAL, CKMB, CKMBINDEX, TROPONINI in the last 168 hours. BNP: Invalid input(s): POCBNP CBG: No results for input(s): GLUCAP in the last 168 hours. D-Dimer No results  for input(s): DDIMER in the last 72 hours. Hgb A1c No results for input(s): HGBA1C in the last 72 hours. Lipid Profile No results for input(s): CHOL, HDL, LDLCALC, TRIG, CHOLHDL, LDLDIRECT in the last 72 hours. Thyroid function studies No results for input(s): TSH, T4TOTAL, T3FREE, THYROIDAB in the last 72 hours.  Invalid input(s): FREET3 Anemia work up No results for input(s): VITAMINB12, FOLATE, FERRITIN, TIBC, IRON, RETICCTPCT in the last 72 hours. Urinalysis    Component Value Date/Time   COLORURINE BROWN (A) 06/22/2021 1627   APPEARANCEUR CLEAR 06/22/2021 1627   LABSPEC >1.030 (H) 06/22/2021 1627   PHURINE 5.5 06/22/2021 [place] 06/22/2021 1627   GLUCOSEU NEGATIVE 01/27/2011 0823   HGBUR LARGE (A) 06/22/2021 1627   HGBUR negative 06/28/2007 0851   BILIRUBINUR SMALL (A) 06/22/2021 1627   BILIRUBINUR NEG 05/20/2014 1142   KETONESUR >80 (A) 06/22/2021 1627   PROTEINUR 100 (A) 06/22/2021 1627   UROBILINOGEN 0.2 05/20/2014 1142   UROBILINOGEN 0.2 09/18/2012 2334   NITRITE NEGATIVE 06/22/2021 1627   LEUKOCYTESUR NEGATIVE 06/22/2021 1627   Sepsis Labs Invalid input(s): PROCALCITONIN,  WBC,  [facility] Recent Results (from the past 240 hour(s))  Culture, blood (routine x 2)     Status: None (Preliminary result)   Collection Time: 06/22/21  4:57 PM   Specimen: Left Antecubital; Blood  Result Value Ref Range Status   Specimen Description   Final    LEFT ANTECUBITAL Performed at [facility], [address] [place], [place] [address]    Special Requests   Final    BOTTLES DRAWN AEROBIC AND ANAEROBIC Blood Culture adequate volume Performed at [facility], [address] [place], [place] [address]    Culture   Final    NO GROWTH 4 DAYS Performed at [facility], [place] [address], [place], [place] [address]    Report Status PENDING  Incomplete  Resp Panel by RT-PCR (Flu A&B, Covid) Nasopharyngeal Swab     Status:  None   Collection Time: 06/22/21  6:18 PM   Specimen: Nasopharyngeal Swab; Nasopharyngeal(NP) swabs in vial transport medium  Result Value Ref Range Status   SARS Coronavirus 2 by RT PCR NEGATIVE NEGATIVE Final    Comment: (NOTE) SARS-CoV-2 target nucleic acids are NOT DETECTED.  The SARS-CoV-2 RNA is generally detectable in upper respiratory specimens during the acute phase of infection. The lowest concentration of SARS-CoV-2 viral copies this assay can detect is 138 copies/mL. A negative result does not preclude SARS-Cov-2 infection and should not be used as the sole basis for treatment or other patient management decisions. A negative result may occur with  improper specimen collection/handling, submission of specimen other than nasopharyngeal swab, presence of viral mutation(s) within the areas targeted by this assay, and inadequate number of  viral copies(<138 copies/mL). A negative result must be combined with clinical observations, patient history, and epidemiological information. The expected result is Negative.  Fact Sheet for Patients:  EntrepreneurPulse.com.au  Fact Sheet for Healthcare Providers:  IncredibleEmployment.be  This test is no t yet approved or cleared by the [place] FDA and  has been authorized for detection and/or diagnosis of SARS-CoV-2 by FDA under an Emergency Use Authorization (EUA). This EUA will remain  in effect (meaning this test can be used) for the duration of the COVID-19 declaration under Section 564(b)(1) of the Act, 21 U.S.C.section 360bbb-3(b)(1), unless the authorization is terminated  or revoked sooner.       Influenza A by PCR NEGATIVE NEGATIVE Final   Influenza B by PCR NEGATIVE NEGATIVE Final    Comment: (NOTE) The Xpert Xpress SARS-CoV-2/FLU/RSV plus assay is intended as an aid in the diagnosis of influenza from Nasopharyngeal swab specimens and should not be used as a sole basis for  treatment. Nasal washings and aspirates are unacceptable for Xpert Xpress SARS-CoV-2/FLU/RSV testing.  Fact Sheet for Patients: EntrepreneurPulse.com.au  Fact Sheet for Healthcare Providers: IncredibleEmployment.be  This test is not yet approved or cleared by the [place] FDA and has been authorized for detection and/or diagnosis of SARS-CoV-2 by FDA under an Emergency Use Authorization (EUA). This EUA will remain in effect (meaning this test can be used) for the duration of the COVID-19 declaration under Section 564(b)(1) of the Act, 21 U.S.C. section 360bbb-3(b)(1), unless the authorization is terminated or revoked.  Performed at [facility], [address] [place], [place] [address]   MRSA Next Gen by PCR, Nasal     Status: None   Collection Time: 06/23/21  6:48 AM   Specimen: Nasal Mucosa; Nasal Swab  Result Value Ref Range Status   MRSA by PCR Next Gen NOT DETECTED NOT DETECTED Final    Comment: (NOTE) The GeneXpert MRSA Assay (FDA approved for NASAL specimens only), is one component of a comprehensive MRSA colonization surveillance program. It is not intended to diagnose MRSA infection nor to guide or monitor treatment for MRSA infections. Test performance is not FDA approved in patients less than 74 years old. Performed at [facility], [place] [address] [place], [place] [address]      Time coordinating discharge: 38 minutes  SIGNED:   Georgette Shell, MD  [facility] 06/26/2021, 2:37 PM

## 2021-06-26 NOTE — Plan of Care (Signed)
  Problem: Clinical Measurements: Goal: Will remain free from infection 06/26/2021 1522 by Epimenio Foot, RN Outcome: Adequate for Discharge 06/26/2021 1522 by Epimenio Foot, RN Outcome: Progressing Goal: Diagnostic test results will improve 06/26/2021 1522 by Epimenio Foot, RN Outcome: Adequate for Discharge 06/26/2021 1522 by Epimenio Foot, RN Outcome: Progressing Goal: Respiratory complications will improve 06/26/2021 1522 by Epimenio Foot, RN Outcome: Adequate for Discharge 06/26/2021 1522 by Epimenio Foot, RN Outcome: Progressing Goal: Cardiovascular complication will be avoided 06/26/2021 1522 by Epimenio Foot, RN Outcome: Adequate for Discharge 06/26/2021 1522 by Epimenio Foot, RN Outcome: Progressing   Problem: Activity: Goal: Risk for activity intolerance will decrease 06/26/2021 1522 by Epimenio Foot, RN Outcome: Adequate for Discharge 06/26/2021 1522 by Epimenio Foot, RN Outcome: Progressing   Problem: Nutrition: Goal: Adequate nutrition will be maintained 06/26/2021 1522 by Epimenio Foot, RN Outcome: Adequate for Discharge 06/26/2021 1522 by Epimenio Foot, RN Outcome: Progressing   Problem: Coping: Goal: Level of anxiety will decrease 06/26/2021 1522 by Epimenio Foot, RN Outcome: Adequate for Discharge 06/26/2021 1522 by Epimenio Foot, RN Outcome: Progressing   Problem: Elimination: Goal: Will not experience complications related to bowel motility 06/26/2021 1522 by Epimenio Foot, RN Outcome: Adequate for Discharge 06/26/2021 1522 by Epimenio Foot, RN Outcome: Progressing Goal: Will not experience complications related to urinary retention 06/26/2021 1522 by Epimenio Foot, RN Outcome: Adequate for Discharge 06/26/2021 1522 by Epimenio Foot, RN Outcome: Progressing   Problem: Pain Managment: Goal: General experience of comfort will improve 06/26/2021 1522 by Epimenio Foot,  RN Outcome: Adequate for Discharge 06/26/2021 1522 by Epimenio Foot, RN Outcome: Progressing   Problem: Safety: Goal: Ability to remain free from injury will improve 06/26/2021 1522 by Epimenio Foot, RN Outcome: Adequate for Discharge 06/26/2021 1522 by Epimenio Foot, RN Outcome: Progressing   Problem: Skin Integrity: Goal: Risk for impaired skin integrity will decrease 06/26/2021 1522 by Epimenio Foot, RN Outcome: Adequate for Discharge 06/26/2021 1522 by Epimenio Foot, RN Outcome: Progressing   Problem: Fluid Volume: Goal: Hemodynamic stability will improve 06/26/2021 1522 by Epimenio Foot, RN Outcome: Adequate for Discharge 06/26/2021 1522 by Epimenio Foot, RN Outcome: Progressing   Problem: Clinical Measurements: Goal: Diagnostic test results will improve 06/26/2021 1522 by Epimenio Foot, RN Outcome: Adequate for Discharge 06/26/2021 1522 by Epimenio Foot, RN Outcome: Progressing Goal: Signs and symptoms of infection will decrease 06/26/2021 1522 by Epimenio Foot, RN Outcome: Adequate for Discharge 06/26/2021 1522 by Epimenio Foot, RN Outcome: Progressing   Problem: Respiratory: Goal: Ability to maintain adequate ventilation will improve 06/26/2021 1522 by Epimenio Foot, RN Outcome: Adequate for Discharge 06/26/2021 1522 by Epimenio Foot, RN Outcome: Progressing

## 2021-06-26 NOTE — Discharge Instructions (Signed)
Patient needs CT chest with contrast in 6 weeks to ensure resolution of abscess and pneumonia

## 2021-06-27 LAB — CULTURE, BLOOD (ROUTINE X 2)
Culture: NO GROWTH
Special Requests: ADEQUATE

## 2021-06-27 NOTE — Anesthesia Postprocedure Evaluation (Signed)
Anesthesia Post Note  Patient: Edward Alvarado  Procedure(s) Performed: ESOPHAGOGASTRODUODENOSCOPY (EGD) WITH PROPOFOL SAVORY DILATION     Patient location during evaluation: PACU Anesthesia Type: MAC Level of consciousness: awake and alert Pain management: pain level controlled Vital Signs Assessment: post-procedure vital signs reviewed and stable Respiratory status: spontaneous breathing, nonlabored ventilation, respiratory function stable and patient connected to nasal cannula oxygen Cardiovascular status: stable and blood pressure returned to baseline Postop Assessment: no apparent nausea or vomiting Anesthetic complications: no   No notable events documented.  Last Vitals:  Vitals:   06/26/21 0450 06/26/21 1300  BP: 107/72 110/76  Pulse: 62 61  Resp: 18 18  Temp: 37.2 C 37.1 C  SpO2: 96% 96%    Last Pain:  Vitals:   06/26/21 1300  TempSrc: Oral  PainSc:    Pain Goal:                   [name]

## 2021-06-29 ENCOUNTER — Telehealth (INDEPENDENT_AMBULATORY_CARE_PROVIDER_SITE_OTHER): Payer: Medicare Other | Admitting: Family Medicine

## 2021-06-29 ENCOUNTER — Encounter (HOSPITAL_COMMUNITY): Payer: Self-pay | Admitting: Gastroenterology

## 2021-06-29 ENCOUNTER — Other Ambulatory Visit: Payer: Self-pay

## 2021-06-29 DIAGNOSIS — R55 Syncope and collapse: Secondary | ICD-10-CM

## 2021-06-29 DIAGNOSIS — I517 Cardiomegaly: Secondary | ICD-10-CM | POA: Diagnosis not present

## 2021-06-29 NOTE — Assessment & Plan Note (Signed)
Only suspicious that previous episodes are due to aspiration.  Which is backed up by SLP evaluation.  No need for formal study at this time recommend following treatment plan from SLP and assessing better or worse performance.  When sitting upright for 30 minutes at least after meals and medicine.

## 2021-06-29 NOTE — Assessment & Plan Note (Signed)
Most likely due to underlying aspiration problem.  Patient was seen by SLP at [facility] and they recommended a regimen that include shorter move more frequent small sips and small bites, taking medication whole in pure.  Patient and mother agree that he has improved symptoms and decreased cough since discharge.  Continue Augmentin treatment.  Will order CT chest in 6 to 10 weeks for follow-up.

## 2021-06-29 NOTE — Progress Notes (Signed)
[facility] Telemedicine Visit  Patient consented to have virtual visit and was identified by name and date of birth. Method of visit: Video  Encounter participants: Patient: Edward Alvarado - located at home Provider: [name] - located at [facility] Others (if applicable): Parents  Chief Complaint: hospital f/u  HPI:  PNA: 40 yo male patient recently admitted to [facility] for aspiration pna with left lung abscess, likely due to esophageal aspiration. He is currently finishing 4 week Augmentin regimen. Will order follow up chest CT in 6-12 weeks as recommended by radiology to ensure clearance of left lung abscess. GI did EGD which found no esophageal cause of aspiration, he did receive dilation. SLP evaluated patient for aspiration. He received pneumovax prior to DC. Today his parents report that he he is doing much better.  He has had decreased cough, no fevers, feeling acting like his normal self.  He is continuing the Augmentin regimen, mother notes that he has had several instances of diarrhea since starting Augmentin.  Currently using yogurt and probiotics for support.  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  Syncope: Patient had several episodes of syncope in the spring and winter, he had full work-up with cardiology and neurology who were concerned for aspiration vaso vagal sx. and history of pneumonia and abscess in left lung, which definitely indicates aspiration, highly suspicious as aspiration for an underlying cause of vasovagal syncope.  Patient was examined by SLP at [facility] and treatment plan in place.  ROS: per HPI  Pertinent PMHx: CP with spastic quadriplegia, wheelchair bound, deaf  Exam:  There were no vitals taken for this visit.  Respiratory: Speaking in full sentences, no increased work of breathing  Assessment/Plan:  Lung abscess (HCC) Most likely due to  underlying aspiration problem.  Patient was seen by SLP at [facility] and they recommended a regimen that include shorter move more frequent small sips and small bites, taking medication whole in pure.  Patient and mother agree that he has improved symptoms and decreased cough since discharge.  Continue Augmentin treatment.  Will order CT chest in 6 to 10 weeks for follow-up.  Syncope and collapse Only suspicious that previous episodes are due to aspiration.  Which is backed up by SLP evaluation.  No need for formal study at this time recommend following treatment plan from SLP and assessing better or worse performance.  When sitting upright for 30 minutes at least after meals and medicine.    Time spent during visit with patient: 19 minutes

## 2021-07-29 ENCOUNTER — Encounter: Payer: Self-pay | Admitting: Family Medicine

## 2021-08-07 DIAGNOSIS — Z23 Encounter for immunization: Secondary | ICD-10-CM | POA: Diagnosis not present

## 2021-08-17 DIAGNOSIS — H401332 Pigmentary glaucoma, bilateral, moderate stage: Secondary | ICD-10-CM | POA: Diagnosis not present

## 2021-08-23 ENCOUNTER — Encounter: Payer: Self-pay | Admitting: Family Medicine

## 2021-08-26 ENCOUNTER — Other Ambulatory Visit: Payer: Self-pay | Admitting: Family Medicine

## 2021-09-02 ENCOUNTER — Other Ambulatory Visit: Payer: Self-pay | Admitting: Family Medicine

## 2021-09-02 ENCOUNTER — Encounter: Payer: Self-pay | Admitting: Family Medicine

## 2021-09-02 DIAGNOSIS — J851 Abscess of lung with pneumonia: Secondary | ICD-10-CM

## 2021-09-05 ENCOUNTER — Encounter: Payer: Self-pay | Admitting: Family Medicine

## 2021-09-06 ENCOUNTER — Telehealth: Payer: Self-pay

## 2021-09-06 NOTE — Telephone Encounter (Signed)
Wheel Chair Repair order needs to be signed by an attending. The company os refaxing today.

## 2021-09-09 NOTE — Telephone Encounter (Signed)
Father calls nurse line stating [employer] received order, however an attending needs to sign the order per insurance.   I called [employer] and asked to have fax resent to [place]. I could not find in outgoing faxes. I asked the attention be put to me.   Will await fax.

## 2021-09-13 ENCOUNTER — Other Ambulatory Visit: Payer: Self-pay

## 2021-09-13 ENCOUNTER — Ambulatory Visit (HOSPITAL_COMMUNITY)
Admission: RE | Admit: 2021-09-13 | Discharge: 2021-09-13 | Disposition: A | Discharge status: Home / Self Care | Payer: Medicare Other | Source: Ambulatory Visit | Attending: Family Medicine | Admitting: Family Medicine

## 2021-09-13 DIAGNOSIS — J851 Abscess of lung with pneumonia: Secondary | ICD-10-CM | POA: Insufficient documentation

## 2021-09-13 DIAGNOSIS — J189 Pneumonia, unspecified organism: Secondary | ICD-10-CM | POA: Diagnosis not present

## 2021-09-13 MED ORDER — IOHEXOL 350 MG/ML SOLN
60.0000 mL | Freq: Once | INTRAVENOUS | Status: AC | PRN
  Administered 2021-09-13: 60 mL via INTRAVENOUS

## 2021-09-14 ENCOUNTER — Encounter: Payer: Self-pay | Admitting: Family Medicine

## 2021-09-14 NOTE — Telephone Encounter (Signed)
Updated prescription faxed. The father has been updated. Hard copy is at my desk.

## 2021-09-14 NOTE — Telephone Encounter (Signed)
I did but it was a day I think you weren't here or a Friday afternoon, I was confused as to why it came back- must have come back before this documentation was in because I looked and didn't see any. It was impossible to read the fax back, I asked hannah and we thought maybe it was because they couldn't read the fax so sent a different one. Can ask any attending. I had signed a paper rx and sent it, which is in my box.  Thanks,  [employer]

## 2021-09-14 NOTE — Telephone Encounter (Signed)
Did you happen to see this come back through in your box recently? I just checked and I didn't see it. They refaxed it on 10/27 because an attending needs to sign.... If you havent seen it ill request another copy.

## 2021-09-24 ENCOUNTER — Telehealth: Payer: Self-pay

## 2021-09-24 NOTE — Telephone Encounter (Signed)
Patients father calls nurse line stating [employer] has faxed over additional information for wheelchair repairs. Father reports this was faxed over on 11/7. I do not see anything in PCP box or outgoing faxes. I contacted [employer] to them refax to our office incase PCP does not have copy.   Of note, an attending must sign behind PCP.

## 2021-09-27 NOTE — Telephone Encounter (Signed)
All forms from [facility] have been completed and signed by an attending. If [employer] needs other documentation, they need to send it.  [name], MD [facility], PGY-3

## 2021-09-28 DIAGNOSIS — H401332 Pigmentary glaucoma, bilateral, moderate stage: Secondary | ICD-10-CM | POA: Diagnosis not present

## 2021-11-23 ENCOUNTER — Other Ambulatory Visit: Payer: Self-pay | Admitting: Family Medicine

## 2022-01-12 IMAGING — CT CT CHEST W/ CM
2 of 4 series · 15 of 36 positions shown, 18 images · IV contrast (omnipaque)
Comparison: Chest CT dated 06/23/2021.

CLINICAL DATA: Pneumonia, effusion, or abscess suspected.

EXAM:
CT CHEST WITH CONTRAST
TECHNIQUE: Multidetector CT imaging of the chest was performed during
intravenous contrast administration.
CONTRAST:  60mL OMNIPAQUE IOHEXOL 350 MG/ML SOLN

[Series 2: axial st · axial · 0.77mm/px · z∈[-264,-22]mm · 12 of 143 slices shown, 15 images]
[im 11/143  mediastinal]
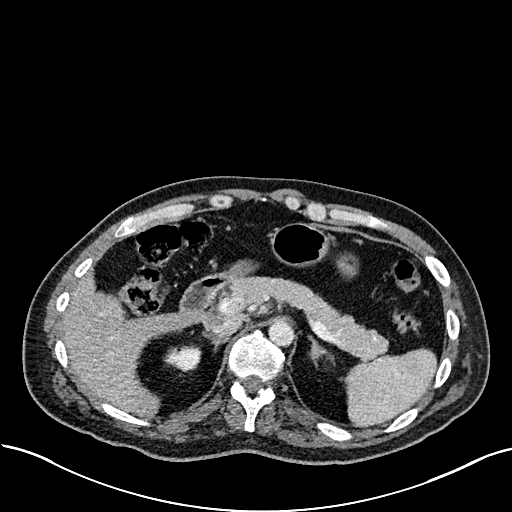
[im 11/143  lung]
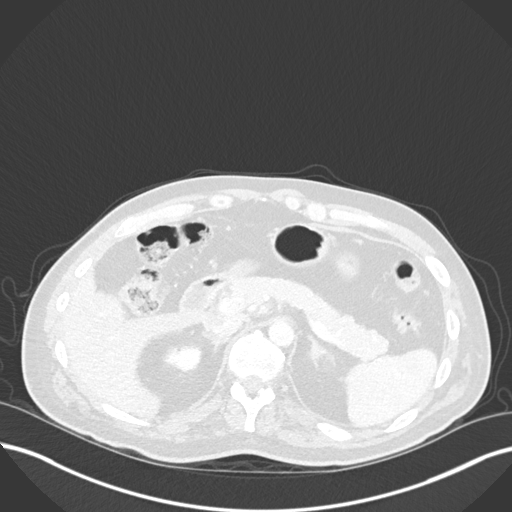
[im 22/143  lung]
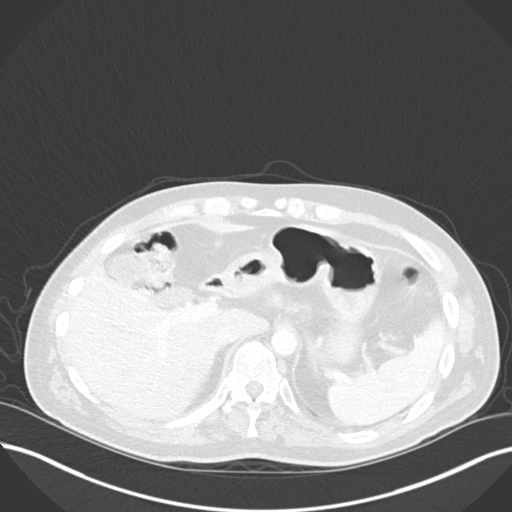
[im 33/143  lung]
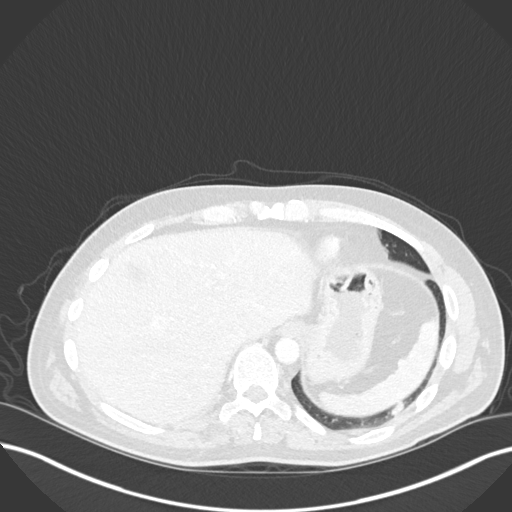
[im 44/143  lung]
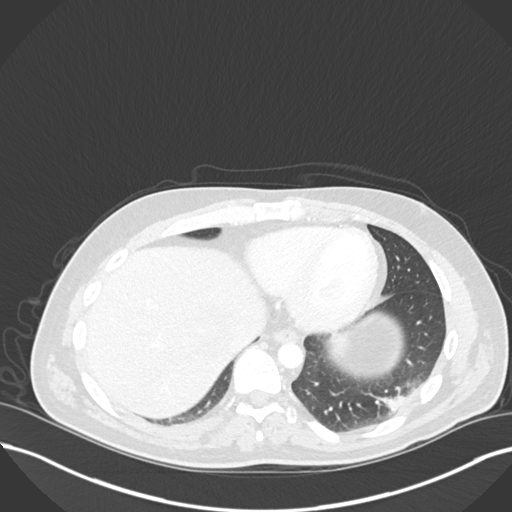
[im 55/143  mediastinal]
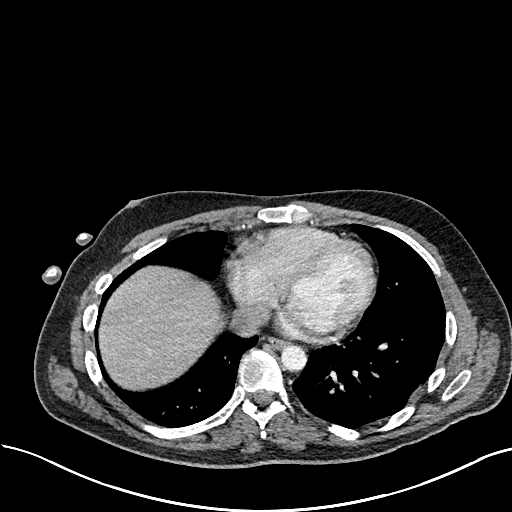
[im 55/143  lung]
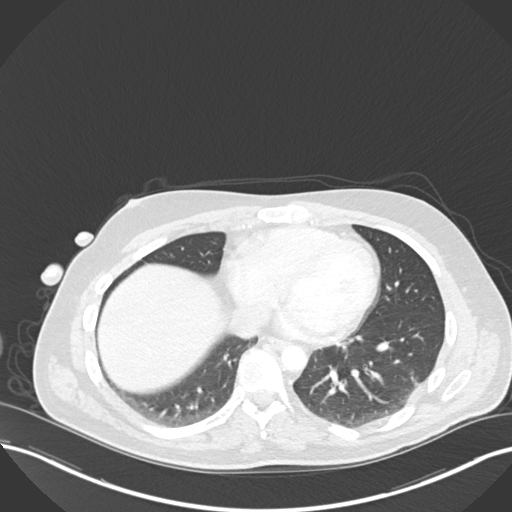
[im 66/143  lung]
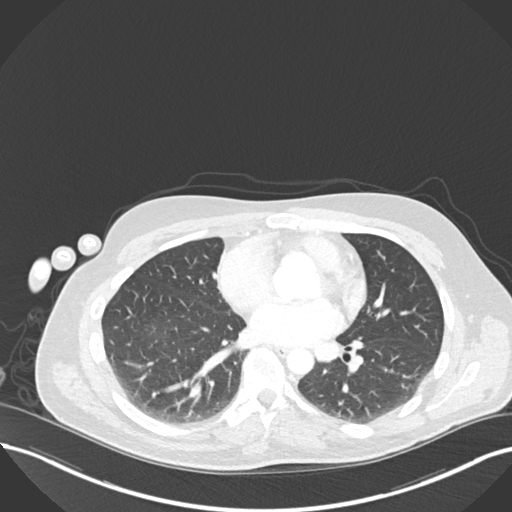
[im 77/143  lung]
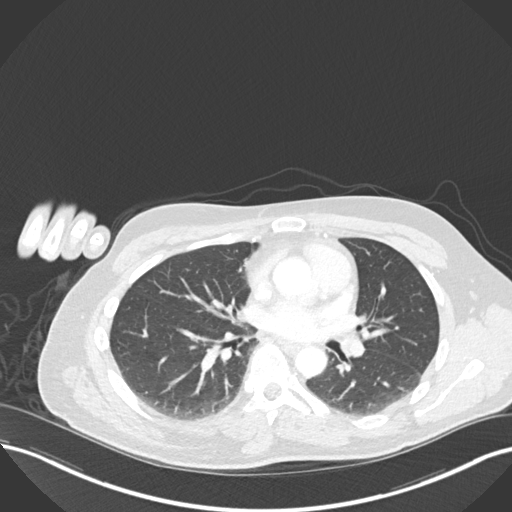
[im 88/143  lung]
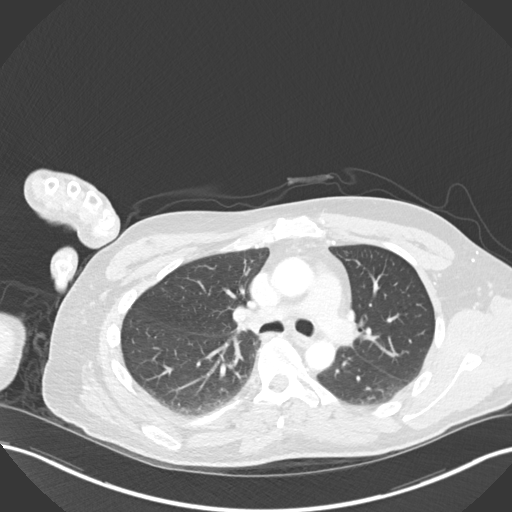
[im 99/143  mediastinal]
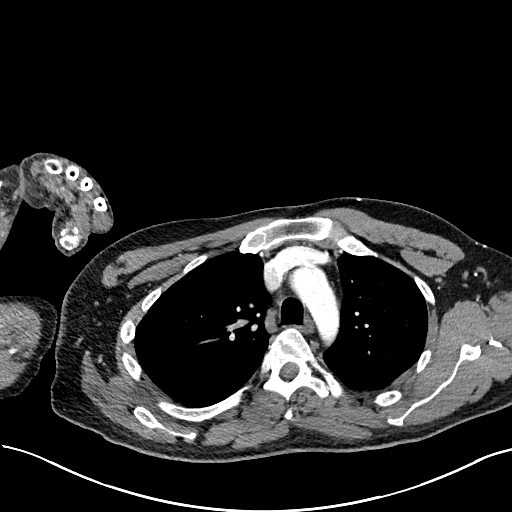
[im 99/143  lung]
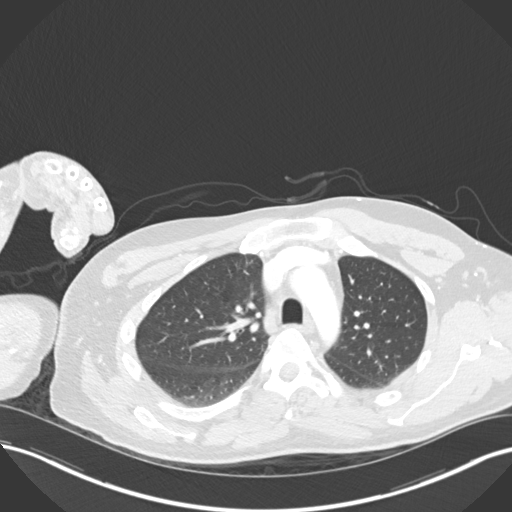
[im 110/143  lung]
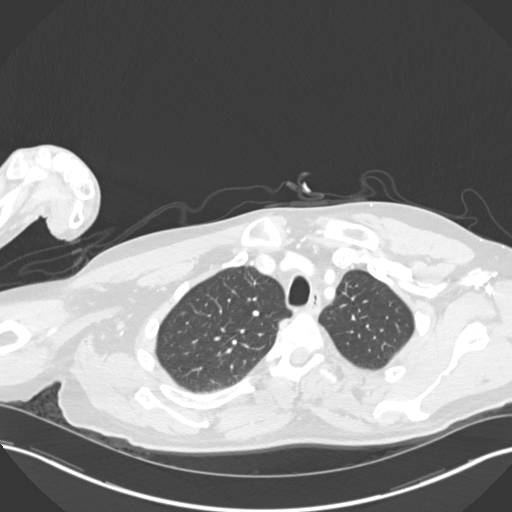
[im 121/143  lung]
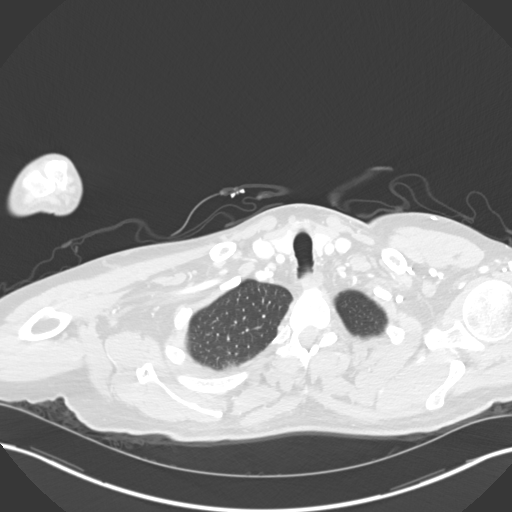
[im 132/143  lung]
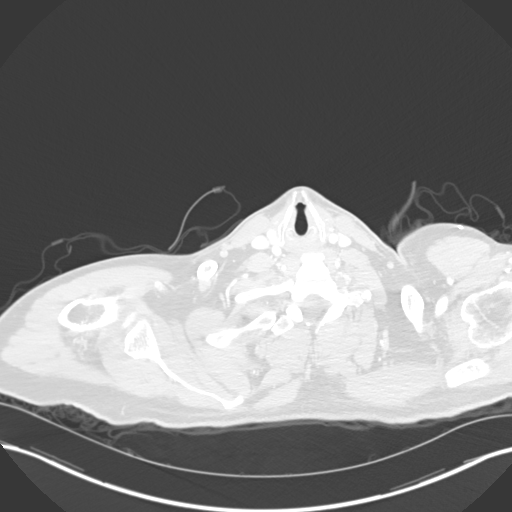

[Series 5: coronal · coronal · 0.57mm/px · 3 of 126 slices shown]
[im 26/126  lung]
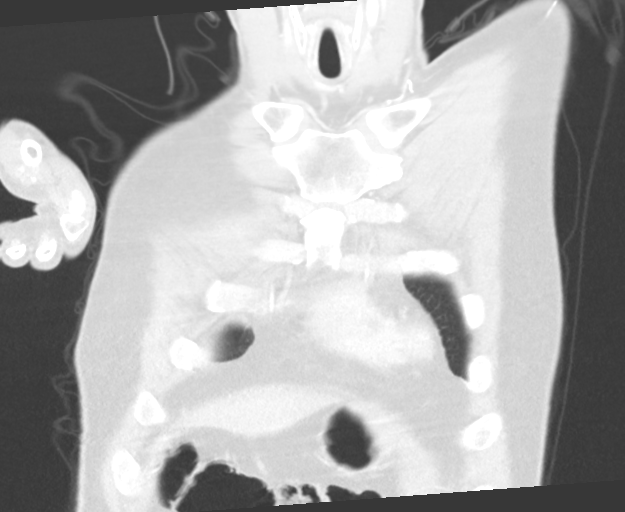
[im 51/126  lung]
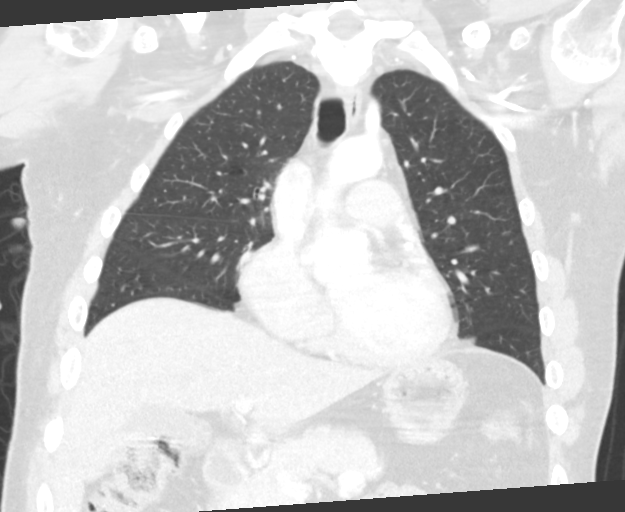
[im 76/126  lung]
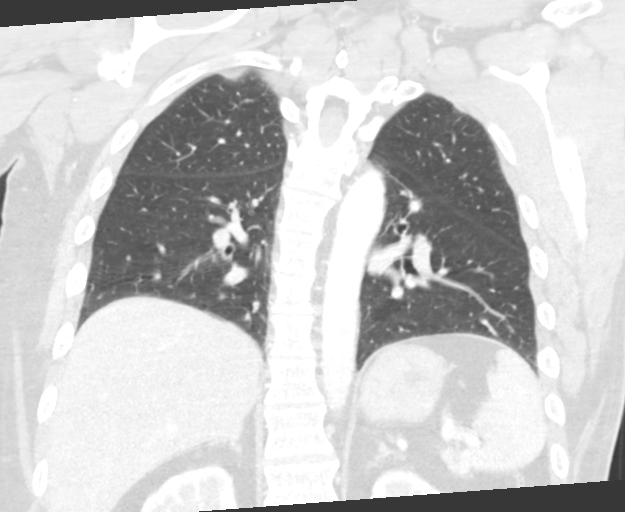

[15 of 36 positions shown; findings below may reference images not displayed]

FINDINGS: Cardiovascular: There is no cardiomegaly or pericardial effusion.
The thoracic aorta is unremarkable. The origins of the great vessels
of the aortic arch appear patent as visualized. The central
pulmonary arteries are unremarkable for the degree of opacification.

Mediastinum/Nodes: No hilar or mediastinal adenopathy. The esophagus
is grossly unremarkable. No mediastinal fluid collection.

Lungs/Pleura: Interval resolution of the previously seen left lower
lobe cavitary lesion with bandlike residual atelectasis/scarring.
The right lung is clear. There is no pleural effusion or
pneumothorax. The central airways are patent.

Upper Abdomen: Scattered colonic diverticula.

Musculoskeletal: No chest wall abnormality. No acute or significant
osseous findings.
IMPRESSION: 1. No acute intrathoracic pathology.
2. Interval resolution of the previously seen left lower lobe
cavitary lesion with residual bandlike atelectasis/scarring.

## 2022-02-22 DIAGNOSIS — H401332 Pigmentary glaucoma, bilateral, moderate stage: Secondary | ICD-10-CM | POA: Diagnosis not present

## 2022-03-10 ENCOUNTER — Other Ambulatory Visit: Payer: Self-pay | Admitting: Family Medicine

## 2022-03-31 ENCOUNTER — Other Ambulatory Visit: Payer: Self-pay | Admitting: Family Medicine

## 2022-03-31 DIAGNOSIS — R0982 Postnasal drip: Secondary | ICD-10-CM

## 2022-05-24 DIAGNOSIS — H401332 Pigmentary glaucoma, bilateral, moderate stage: Secondary | ICD-10-CM | POA: Diagnosis not present

## 2022-06-07 DIAGNOSIS — H401330 Pigmentary glaucoma, bilateral, stage unspecified: Secondary | ICD-10-CM | POA: Diagnosis not present

## 2022-06-07 DIAGNOSIS — H02401 Unspecified ptosis of right eyelid: Secondary | ICD-10-CM | POA: Diagnosis not present

## 2022-06-07 DIAGNOSIS — Z961 Presence of intraocular lens: Secondary | ICD-10-CM | POA: Diagnosis not present

## 2022-06-19 ENCOUNTER — Encounter (HOSPITAL_BASED_OUTPATIENT_CLINIC_OR_DEPARTMENT_OTHER): Payer: Self-pay | Admitting: Emergency Medicine

## 2022-06-19 ENCOUNTER — Other Ambulatory Visit: Payer: Self-pay

## 2022-06-19 ENCOUNTER — Emergency Department (HOSPITAL_BASED_OUTPATIENT_CLINIC_OR_DEPARTMENT_OTHER): Payer: Medicare Other

## 2022-06-19 ENCOUNTER — Emergency Department (HOSPITAL_BASED_OUTPATIENT_CLINIC_OR_DEPARTMENT_OTHER)
Admission: EM | Admit: 2022-06-19 | Discharge: 2022-06-19 | Disposition: A | Discharge status: Home / Self Care | Payer: Medicare Other | Attending: Emergency Medicine | Admitting: Emergency Medicine

## 2022-06-19 DIAGNOSIS — S8992XA Unspecified injury of left lower leg, initial encounter: Secondary | ICD-10-CM | POA: Diagnosis present

## 2022-06-19 DIAGNOSIS — M7989 Other specified soft tissue disorders: Secondary | ICD-10-CM | POA: Diagnosis not present

## 2022-06-19 DIAGNOSIS — S8012XA Contusion of left lower leg, initial encounter: Secondary | ICD-10-CM | POA: Diagnosis not present

## 2022-06-19 DIAGNOSIS — S8002XA Contusion of left knee, initial encounter: Secondary | ICD-10-CM | POA: Diagnosis not present

## 2022-06-19 DIAGNOSIS — X58XXXA Exposure to other specified factors, initial encounter: Secondary | ICD-10-CM | POA: Insufficient documentation

## 2022-06-19 DIAGNOSIS — M79662 Pain in left lower leg: Secondary | ICD-10-CM | POA: Diagnosis not present

## 2022-06-19 LAB — BASIC METABOLIC PANEL
Anion gap: 4 — ABNORMAL LOW (ref 5–15)
BUN: 16 mg/dL (ref 6–20)
CO2: 24 mmol/L (ref 22–32)
Calcium: 8.9 mg/dL (ref 8.9–10.3)
Chloride: 106 mmol/L (ref 98–111)
Creatinine, Ser: 0.79 mg/dL (ref 0.61–1.24)
GFR, Estimated: >60 mL/min (ref >60)
Glucose, Bld: 100 mg/dL — ABNORMAL HIGH (ref 70–99)
Potassium: 4.3 mmol/L (ref 3.5–5.1)
Sodium: 134 mmol/L — ABNORMAL LOW (ref 135–145)

## 2022-06-19 LAB — CBC WITH DIFFERENTIAL/PLATELET
Abs Immature Granulocytes: 0.04 10*3/uL (ref 0.00–0.07)
Basophils Absolute: 0 10*3/uL (ref 0.0–0.1)
Basophils Relative: 0 %
Eosinophils Absolute: 0.6 10*3/uL — ABNORMAL HIGH (ref 0.0–0.5)
Eosinophils Relative: 7 %
HCT: 40.2 % (ref 39.0–52.0)
Hemoglobin: 14.1 g/dL (ref 13.0–17.0)
Immature Granulocytes: 1 %
Lymphocytes Relative: 24 %
Lymphs Abs: 2.2 10*3/uL (ref 0.7–4.0)
MCH: 31.3 pg (ref 26.0–34.0)
MCHC: 35.1 g/dL (ref 30.0–36.0)
MCV: 89.3 fL (ref 80.0–100.0)
Monocytes Absolute: 0.8 10*3/uL (ref 0.1–1.0)
Monocytes Relative: 9 %
Neutro Abs: 5.2 10*3/uL (ref 1.7–7.7)
Neutrophils Relative %: 59 %
Platelets: 164 10*3/uL (ref 150–400)
RBC: 4.5 MIL/uL (ref 4.22–5.81)
RDW: 12.2 % (ref 11.5–15.5)
WBC: 8.8 10*3/uL (ref 4.0–10.5)
nRBC: 0 % (ref 0.0–0.2)

## 2022-06-19 LAB — C-REACTIVE PROTEIN: CRP: 0.6 mg/dL (ref <1.0)

## 2022-06-19 MED ORDER — NAPROXEN 250 MG PO TABS
500.0000 mg | ORAL_TABLET | Freq: Once | ORAL | Status: AC
  Administered 2022-06-19: 500 mg via ORAL
  Filled 2022-06-19: qty 2

## 2022-06-19 MED ORDER — NAPROXEN 500 MG PO TABS: 500.0000 mg | ORAL_TABLET | Freq: Two times a day (BID) | ORAL | 0 refills | Status: DC
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
## 2022-06-19 NOTE — ED Notes (Signed)
US at bedside

## 2022-06-19 NOTE — ED Triage Notes (Signed)
Pt is deaf, parents accompany pt.  Pt arrives pov, to triage in personal wheelchair, c/o LT leg swelling and bruising. Grp home notified parents today

## 2022-06-22 ENCOUNTER — Emergency Department (HOSPITAL_BASED_OUTPATIENT_CLINIC_OR_DEPARTMENT_OTHER): Payer: Medicare Other

## 2022-06-22 ENCOUNTER — Emergency Department (HOSPITAL_BASED_OUTPATIENT_CLINIC_OR_DEPARTMENT_OTHER)
Admission: EM | Admit: 2022-06-22 | Discharge: 2022-06-22 | Disposition: A | Discharge status: Home / Self Care | Payer: Medicare Other | Attending: Emergency Medicine | Admitting: Emergency Medicine

## 2022-06-22 ENCOUNTER — Encounter (HOSPITAL_BASED_OUTPATIENT_CLINIC_OR_DEPARTMENT_OTHER): Payer: Self-pay | Admitting: Pediatrics

## 2022-06-22 ENCOUNTER — Other Ambulatory Visit: Payer: Self-pay

## 2022-06-22 DIAGNOSIS — W228XXA Striking against or struck by other objects, initial encounter: Secondary | ICD-10-CM | POA: Diagnosis not present

## 2022-06-22 DIAGNOSIS — M7042 Prepatellar bursitis, left knee: Secondary | ICD-10-CM | POA: Diagnosis not present

## 2022-06-22 DIAGNOSIS — Y9389 Activity, other specified: Secondary | ICD-10-CM | POA: Diagnosis not present

## 2022-06-22 DIAGNOSIS — S8012XA Contusion of left lower leg, initial encounter: Secondary | ICD-10-CM | POA: Insufficient documentation

## 2022-06-22 DIAGNOSIS — S8992XA Unspecified injury of left lower leg, initial encounter: Secondary | ICD-10-CM | POA: Diagnosis present

## 2022-06-22 DIAGNOSIS — M7989 Other specified soft tissue disorders: Secondary | ICD-10-CM | POA: Diagnosis not present

## 2022-06-22 LAB — CBC WITH DIFFERENTIAL/PLATELET
Abs Immature Granulocytes: 0.04 10*3/uL (ref 0.00–0.07)
Basophils Absolute: 0 10*3/uL (ref 0.0–0.1)
Basophils Relative: 0 %
Eosinophils Absolute: 0.4 10*3/uL (ref 0.0–0.5)
Eosinophils Relative: 4 %
HCT: 41.9 % (ref 39.0–52.0)
Hemoglobin: 14.6 g/dL (ref 13.0–17.0)
Immature Granulocytes: 0 %
Lymphocytes Relative: 21 %
Lymphs Abs: 2.2 10*3/uL (ref 0.7–4.0)
MCH: 31.6 pg (ref 26.0–34.0)
MCHC: 34.8 g/dL (ref 30.0–36.0)
MCV: 90.7 fL (ref 80.0–100.0)
Monocytes Absolute: 1.1 10*3/uL — ABNORMAL HIGH (ref 0.1–1.0)
Monocytes Relative: 10 %
Neutro Abs: 6.6 10*3/uL (ref 1.7–7.7)
Neutrophils Relative %: 65 %
Platelets: 165 10*3/uL (ref 150–400)
RBC: 4.62 MIL/uL (ref 4.22–5.81)
RDW: 12.4 % (ref 11.5–15.5)
WBC: 10.3 10*3/uL (ref 4.0–10.5)
nRBC: 0 % (ref 0.0–0.2)

## 2022-06-22 LAB — COMPREHENSIVE METABOLIC PANEL
ALT: 19 U/L (ref 0–44)
AST: 21 U/L (ref 15–41)
Albumin: 4.3 g/dL (ref 3.5–5.0)
Alkaline Phosphatase: 82 U/L (ref 38–126)
Anion gap: 7 (ref 5–15)
BUN: 16 mg/dL (ref 6–20)
CO2: 28 mmol/L (ref 22–32)
Calcium: 9.7 mg/dL (ref 8.9–10.3)
Chloride: 105 mmol/L (ref 98–111)
Creatinine, Ser: 0.84 mg/dL (ref 0.61–1.24)
GFR, Estimated: >60 mL/min (ref >60)
Glucose, Bld: 88 mg/dL (ref 70–99)
Potassium: 4.2 mmol/L (ref 3.5–5.1)
Sodium: 140 mmol/L (ref 135–145)
Total Bilirubin: 1.3 mg/dL — ABNORMAL HIGH (ref 0.3–1.2)
Total Protein: 7.8 g/dL (ref 6.5–8.1)

## 2022-06-22 LAB — PROTIME-INR
INR: 1 (ref 0.8–1.2)
Prothrombin Time: 12.9 seconds (ref 11.4–15.2)

## 2022-06-22 NOTE — Discharge Instructions (Signed)
Return to the ER or call 911 if he develops trouble breathing, complains of chest pain, develops a fever, has new or worsening bruising or swelling, or if he has any other new/concerning symptoms.

## 2022-06-22 NOTE — ED Notes (Signed)
Pt transported to US
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
## 2022-06-22 NOTE — ED Triage Notes (Signed)
Mother at bedside; stated they were seen last Sunday for similar issue on left knee; here today for findings of new bruises.

## 2022-06-24 ENCOUNTER — Ambulatory Visit (INDEPENDENT_AMBULATORY_CARE_PROVIDER_SITE_OTHER): Payer: Medicare Other | Admitting: Student

## 2022-06-24 VITALS — BP 150/102 | HR 95 | Temp 98.7°F | Ht 69.0 in | Wt 157.2 lb

## 2022-06-24 DIAGNOSIS — S8992XA Unspecified injury of left lower leg, initial encounter: Secondary | ICD-10-CM | POA: Diagnosis not present

## 2022-06-24 DIAGNOSIS — R030 Elevated blood-pressure reading, without diagnosis of hypertension: Secondary | ICD-10-CM | POA: Diagnosis not present

## 2022-06-24 DIAGNOSIS — I10 Essential (primary) hypertension: Secondary | ICD-10-CM | POA: Insufficient documentation

## 2022-06-24 MED ORDER — IBUPROFEN 600 MG PO TABS: 600.0000 mg | ORAL_TABLET | Freq: Three times a day (TID) | ORAL | 0 refills | Status: DC | PRN

## 2022-06-24 NOTE — Assessment & Plan Note (Signed)
Concern for septic arthritis given findings on physical exam and history provided. Reassuringly, patient has no signs of systemic symptoms or reported pain Afebrile on exam today with hemodynamic stability DVT ultrasounds and x-rays performed in the emergency room were negative and reassuring Would like to have further ultrasound versus drainage of the joint for further assessment, referral to sports medicine placed. Can continue with ibuprofen as needed as patient is not having pain and can ice to assist with swelling at the group home.  Strict return precautions provided.

## 2022-06-24 NOTE — Assessment & Plan Note (Signed)
No documentation of hypertension without previous antihypertensive Patient had elevated blood pressure in the emergency room as well as here in clinic today Family has home blood pressure cuff and they will check his pressures over the next week or 2 If consistently elevated, we will have him come in for nursing visit for blood pressure and possibly prescribe an antihypertensive if persistently high.

## 2022-06-24 NOTE — Progress Notes (Signed)
SUBJECTIVE:   CHIEF COMPLAINT / HPI:   Leg contusions: Patient was seen in the emergency room for worsening leg bruising with a history of cerebral palsy and is wheelchair-bound.  He received x-rays at the beginning of that were unremarkable, has had DVT U/S that was negative.  Patient lives in group home although there is not suspected abuse.   Contusions have been present since last week.  Per parents, patient was at a grocery store and was trying to get around a fellow patron, hitting his left knee on a machine.  At that time, he did not complain of pain but had swelling and warmth in the knee which prompted a visit to the emergency room.  Patient had no fevers or chills has not had this since the incident.  No other bruising present on the body.  Labs obtained in the emergency room were fairly unremarkable, CBC CMP prothrombin time/INR all were normal.   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 Patient was in the hospital for cellulitis and prepatellar bursitis in 2012.  Used to knee walk around the house at that time but has since been in the wheelchair.  Use of ASL interpreter throughout visit.   PERTINENT  PMH / PSH:   Past Medical History:  Diagnosis Date   Allergic rhinitis    Allergy    Atrophy, cortical    Lower left ventrical   Cerebral palsy (HCC)    W/C BOUND-ABLE TO TRANSFER W/C TO BED -DOES REQUIRE SOME HELP IN BATHROOM WITH CLOTHES--LIVES IN GROUP HOME CALLED [name]-HIS PARENTS ARE HIS LEGAL GUARDIANS   Complication of anesthesia    JAN 2012 EYE SURGERY AT [facility]--LMA WAS USED AND PT EXPERIENCED AIRWAY / OXYGENATION PROBLEMS.  PT HAD SUBSEQUENT SURGERIES  AFTER JAN 2012 AT [facility] AND PARENTS TOLD ET USED AND PT DID FINE.  PT HAD NO ANESTHESIA PROBLEMS WITH SURGERIES PRIOR TO THE JAN 2012 SURGERY.   Deafness    hearing aids-NOT WEARING AT PRESENT TIME; ABLE TO DO SIGN  LANGUAGE WITH HIS PARENTS   Glaucoma    Hyperlipidemia    Kidney stone    Platelets decreased (HCC)    Prepatellar bursitis    Hospitalized in 2012 for sepsis secondary to bursisiitis   Scoliosis     OBJECTIVE:  BP (!) 150/102   Pulse 95   Temp 98.7 F (37.1 C)   Ht 5\' 9"  (1.753 m)   Wt 157 lb 3.2 oz (71.3 kg)   SpO2 100%   BMI 23.21 kg/m   General: NAD, able to participate in exam, in wheelchair  Cardiac: RRR, no murmurs auscultated Respiratory: CTAB, normal WOB Extremities: Swelling of the left knee with mild erythema and warmth, no pain with range of motion  Psych: Normal affect and mood  ASSESSMENT/PLAN:  Injury of left knee Concern for septic arthritis given findings on physical exam and history provided. Reassuringly, patient has no signs of systemic symptoms or reported pain Afebrile on exam today with hemodynamic stability DVT ultrasounds and x-rays performed in the emergency room were negative and reassuring Would like to have further ultrasound versus drainage of the joint for further assessment, referral to sports medicine placed. Can continue with ibuprofen as needed as patient is not having pain and can ice to assist with swelling at the group home.  Strict return precautions provided.  Elevated BP without diagnosis of hypertension No documentation  of hypertension without previous antihypertensive Patient had elevated blood pressure in the emergency room as well as here in clinic today Family has home blood pressure cuff and they will check his pressures over the next week or 2 If consistently elevated, we will have him come in for nursing visit for blood pressure and possibly prescribe an antihypertensive if persistently high.   Orders Placed This Encounter  Procedures   Ambulatory referral to Sports Medicine    Referral Priority:   Routine    Referral Type:   Consultation    Number of Visits Requested:   1   Meds ordered this encounter  Medications    ibuprofen (ADVIL) 600 MG tablet    Sig: Take 1 tablet (600 mg total) by mouth every 8 (eight) hours as needed.    Dispense:  30 tablet    Refill:  0   Return in about 4 weeks (around 07/22/2022), or if symptoms worsen or fail to improve. [name], MD 06/24/2022, 5:06 PM PGY-2, [facility]

## 2022-06-24 NOTE — Patient Instructions (Addendum)
It was great to see you today! Thank you for choosing [facility] for your primary care. Edward Alvarado was seen for emergency room follow-up.  Today we addressed: Previous labs and x-rays were normal, which is good! Continue with ibuprofen as needed every 6-8 hours  Continue with icing once a day in the afternoon for swelling and comfort for the next 1-2 weeks  I will refer you to sports medicine to have an ultrasound of the knee specifically, they will call you  [phone] call for appt if they do not contact you   If you haven't already, sign up for My Chart to have easy access to your labs results, and communication with your primary care physician.   You should return to our clinic Return in about 4 weeks (around 07/22/2022), or if symptoms worsen or fail to improve.  I recommend that you always bring your medications to each appointment as this makes it easy to ensure you are on the correct medications and helps [place] not miss refills when you need them.  Please arrive 15 minutes before your appointment to ensure smooth check in process.  We appreciate your efforts in making this happen.  Please call the clinic at [phone] if your symptoms worsen or you have any concerns.  Thank you for allowing me to participate in your care, [name], MD 06/24/2022, 4:38 PM PGY-2, [facility]

## 2022-06-25 ENCOUNTER — Encounter: Payer: Self-pay | Admitting: Student

## 2022-06-29 ENCOUNTER — Ambulatory Visit: Payer: Self-pay

## 2022-06-29 ENCOUNTER — Ambulatory Visit (INDEPENDENT_AMBULATORY_CARE_PROVIDER_SITE_OTHER): Payer: Medicare Other | Admitting: Family Medicine

## 2022-06-29 ENCOUNTER — Encounter: Payer: Self-pay | Admitting: Family Medicine

## 2022-06-29 VITALS — BP 139/81 | Ht 70.0 in | Wt 157.0 lb

## 2022-06-29 DIAGNOSIS — M25562 Pain in left knee: Secondary | ICD-10-CM | POA: Diagnosis not present

## 2022-06-29 DIAGNOSIS — M7052 Other bursitis of knee, left knee: Secondary | ICD-10-CM | POA: Diagnosis not present

## 2022-06-29 NOTE — Assessment & Plan Note (Signed)
Seen on ultrasound today.  This can explain some of the patient's swelling.  Did discuss drainage with mother and how this would give him some relief however swelling and discomfort would likely return.  She opted for conservative management.  Compression stocking and Tylenol as needed for pain.  Follow-up if no improvement in symptoms over the next 4 to 6 weeks.

## 2022-06-29 NOTE — Patient Instructions (Signed)
You have torn one of your hamstring tendons which has caused a lot of swelling and bleeding.  Recommend icing multiple times a day for 15 to 20 minutes on and at least 20 minutes off.  Tylenol as needed for pain which will be better than Advil or naproxen at this time.  A left leg compression sleeve to about his mid thigh will also be helpful and elevation as often as possible if possible for the next 4 weeks.  I expect he will have some bruising and swelling for the next couple of weeks.  Follow-up if symptoms worsen or do not improve

## 2022-06-29 NOTE — Progress Notes (Unsigned)
Established Patient Office Visit  Subjective   Patient ID: Edward Alvarado, male    DOB: 27-Jun-1981  Age: 41 y.o. MRN: [id number]  Left knee swelling and bruising Ed presents today with his mother and sign language interpreter, as he is nonverbal secondary to cerebral palsy.  Uses motorized wheelchair.  Mother reports he does transfer himself, weightbearing on bilateral legs.  His mother was concerned about his left knee as she noticed couple of weeks ago some swelling and bruising along the posterior aspect of his left leg.  Of note patient did attend camp where he fell while trying to transfer from his chair onto the train.  He had a large area of ecchymosis on his back that resolved without any further intervention.  At denies any pain to his back today.  When asked where he is having pain he points to the anterior surface of his left knee.  Denies any injury to his left knee however it was reported by his caregiver couple weeks ago that he was maneuvering quickly around another patron at the store and hit his left knee on some type of machine.  When his mother noticed that bruising about 10 days ago he was evaluated at urgent care and Doppler ultrasound was performed which was negative for DVT.  He returned again after mother noticed increased swelling and bruising down his posterior leg and into his foot.  Another Doppler was performed which was negative at that time as well for DVT.  Mother said she was here today for evaluation if the knee would benefit from being drained.    Objective:     BP 139/81   Ht 5\' 10"  (1.778 m)   Wt 157 lb (71.2 kg)   BMI 22.53 kg/m   Physical Exam Vitals (Ambulates in motorized wheelchair. Communicates via sign language with interpreter) reviewed.  Constitutional:      General: He is not in acute distress.    Appearance: He is not ill-appearing, toxic-appearing or diaphoretic.  Neurological:     Mental Status: He is alert.  Left knee: There is obvious  chronic atrophy of bilateral lower extremities.  No obvious deformity.  There is extensive ecchymosis to the distal posterior thigh, calf and ankle.  Notable swelling around the posterior knee shin and foot.  No obvious sign of tenderness with palpation of the knee. Spastic range of motion flexion and extension.  Ultrasound of Knee-left knee  Patellar tendon -visualized, no abnormality seen Quad tendon -visualized, no abnormality seen Suprapatellar pouch - no effusion visualized, no abnormality seen Medial meniscus-visualized, no abnormality seen Lateral meniscus-visualized, no abnormality seen Popliteal fossa-visualized, no abnormality or Baker's cyst seen Infrapatellar bursa-large hypoechoic pouch  Pes anserine-large amount of anechoic fluid surrounding the area, cannot visualize insertion of semitendinosis  Summary and additional findings-concern for infrapatellar bursitis as well as rupture of semitendinosis tendon from the pes anserine insertion, with notable anechoic fluid seen surrounding these areas.  Ultrasound and interpretation by Dr and Dr Leonor Liv    Assessment & Plan:   Problem List Items Addressed This Visit       Musculoskeletal and Integument   Infrapatellar bursitis of left knee    Seen on ultrasound today.  This can explain some of the patient's swelling.  Did discuss drainage with mother and how this would give him some relief however swelling and discomfort would likely return.  She opted for conservative management.  Compression stocking and Tylenol as needed for pain.  Follow-up if  no improvement in symptoms over the next 4 to 6 weeks.        Other   Left knee pain - Primary    It appears on exam, ultrasound patient has ruptured his semitendinosis tendon at the distal insertion, pes.  There is decent amount of swelling located there and no solid insertion point was seen.  This is not a surgical issue.  It is not inhibiting patient from weightbearing to  transfer himself.  This is likely the source of his swelling and extensive bruising.  A prescription for compression stocking to left thigh was given today.  I also recommended Tylenol for pain and lieu of NSAIDs at this time.  Would recommend icing as frequently as tolerated and attempting elevation as possible.  Swelling and bruising should improve over the next 4 to 6 weeks.  Return to clinic if symptoms worsen or do not improve.      Relevant Orders   [place] LIMITED JOINT SPACE STRUCTURES LOW LEFT    Return if symptoms worsen or fail to improve.    Claudie Leach, DO

## 2022-06-29 NOTE — Assessment & Plan Note (Signed)
It appears on radiographical exam, ultrasound patient has ruptured his semitendinosis, hamstring tendon at the distal insertion, pes.  There is decent amount of swelling located there and no solid insertion point was seen.  This is not a surgical issue.  It is not inhibiting patient from weightbearing to transfer himself.  This is likely the source of his swelling and extensive bruising.  A prescription for compression stocking to left thigh was given today.  I also recommended Tylenol for pain and lieu of NSAIDs at this time.  Would recommend icing as frequently as tolerated and attempting elevation as possible.  Swelling and bruising should improve over the next 4 to 6 weeks.  Return to clinic if symptoms worsen or do not improve.

## 2022-06-30 ENCOUNTER — Encounter: Payer: Self-pay | Admitting: Student

## 2022-06-30 NOTE — Telephone Encounter (Signed)
Attempted to call patient/family about bruises messaged in Edward Alvarado and went to voicemail. Left a HIPAA compliant message to call back as needed as this patient was seen by sports medicine yesterday concerning this issue.

## 2022-07-08 ENCOUNTER — Other Ambulatory Visit: Payer: Self-pay

## 2022-07-08 MED ORDER — ONE-A-DAY MENS PO TABS: 1.0000 | ORAL_TABLET | Freq: Every day | ORAL | 11 refills | Status: DC

## 2022-07-10 ENCOUNTER — Encounter: Payer: Self-pay | Admitting: Student

## 2022-07-24 NOTE — Progress Notes (Unsigned)
SUBJECTIVE:   CHIEF COMPLAINT / HPI:   Left Knee Swelling; Infrapatellar bursitis:  Seen by me 06/24/22 after acute onset of left knee swelling from trauma to area, sent to sports medicine for further assessment. The sports medicine team determined he likely had a ruptured semitendinosus tendon at the distal insertion of the pes, not noted as surgical issue. Continued with conservative management of the knee given lack of mobility and after risk/benefit discussion. They decided against aspiration of the bursitis considering that the fluid would likely return.   Since our visit, patient has had great improvement of his symptoms and he has had no pain, swelling has minimized without heat or redness. The knee appears to be close to baseline.     ASL interpreter assisting throughout visit.   They are asking about the benefit of the RSV vaccine   Additionally, Edward Alvarado has had a slightly elevated BP recently and had an elevated BP again today.   PERTINENT  PMH / PSH:   Past Medical History:  Diagnosis Date   Allergic rhinitis    Allergy    Atrophy, cortical    Lower left ventrical   Cerebral palsy (HCC)    W/C BOUND-ABLE TO TRANSFER W/C TO BED -DOES REQUIRE SOME HELP IN BATHROOM WITH CLOTHES--LIVES IN GROUP HOME CALLED [name]-HIS PARENTS ARE HIS LEGAL GUARDIANS   Complication of anesthesia    JAN 2012 EYE SURGERY AT [facility]--LMA WAS USED AND PT EXPERIENCED AIRWAY / OXYGENATION PROBLEMS.  PT HAD SUBSEQUENT SURGERIES  AFTER JAN 2012 AT [facility] AND PARENTS TOLD ET USED AND PT DID FINE.  PT HAD NO ANESTHESIA PROBLEMS WITH SURGERIES PRIOR TO THE JAN 2012 SURGERY.   Deafness    hearing aids-NOT WEARING AT PRESENT TIME; ABLE TO DO SIGN LANGUAGE WITH HIS PARENTS   Glaucoma    Hyperlipidemia    Kidney stone    Platelets decreased (HCC)    Prepatellar bursitis    Hospitalized in 2012 for sepsis secondary to bursisiitis   Scoliosis     OBJECTIVE:  BP (!) 152/94   Pulse 86   Temp 98.1 F (36.7 C)    Ht 5\' 10"  (1.778 m)   Wt 159 lb 3.2 oz (72.2 kg)   SpO2 99%   BMI 22.84 kg/m   General: NAD, pleasant, able to participate in exam, in wheelchair with ASL interpreter in room, appropriately responsive  Cardiac: RRR, no murmurs auscultated Respiratory: CTAB, normal WOB Abdomen: soft, non-tender, non-distended, normoactive bowel sounds Extremities: warm and well perfused, no edema or cyanosis, greatly improved swelling with no redness or heat to touch, NTTP, chronically contracted in lower extremities  Skin: warm and dry, no rashes noted Neuro: alert, no obvious focal deficits, speech normal Psych: Normal affect and mood  ASSESSMENT/PLAN:  Infrapatellar bursitis of left knee Improving greatly on exam today.  Patient is asymptomatic and is hemodynamically stable in office today. Can continue with compression stockings and ice as needed for pain or swelling. Conservative treatment measures, no surgical intervention indicated.   Elevated BP without diagnosis of hypertension Would likely need an official diagnosis of hypertension at this point given several elevated pressures.  BMP updated, losartan 25 Mg ordered daily for blood pressure.  Check blood pressure daily for about 2 weeks and we will monitor from there.    RSV vaccine would likely be beneficial for patient given wheelchair bound with history of aspiration PNA and lives in facility.   No orders of the defined types were placed in  this encounter.  Meds ordered this encounter  Medications   losartan (COZAAR) 25 MG tablet    Sig: Take 1 tablet (25 mg total) by mouth daily.    Dispense:  30 tablet    Refill:  0   Return for annual . [name], MD 07/25/2022, 2:32 PM PGY-2, [facility]

## 2022-07-24 NOTE — Patient Instructions (Incomplete)
It was great to see you today! Thank you for choosing [facility] for your primary care. Edward Alvarado was seen for left knee injury.  Today we addressed: Continued conservative treatment with elevation, ice, tylenol for pain relief.   If you haven't already, sign up for My Chart to have easy access to your labs results, and communication with your primary care physician.  {AVS options:28020}  You should return to our clinic No follow-ups on file. Please arrive 15 minutes before your appointment to ensure smooth check in process.  We appreciate your efforts in making this happen.  Thank you for allowing me to participate in your care, [name], MD 07/24/2022, 6:44 PM PGY-2, [facility]

## 2022-07-25 ENCOUNTER — Ambulatory Visit (INDEPENDENT_AMBULATORY_CARE_PROVIDER_SITE_OTHER): Payer: Medicare Other | Admitting: Student

## 2022-07-25 ENCOUNTER — Encounter: Payer: Self-pay | Admitting: Student

## 2022-07-25 VITALS — BP 152/94 | HR 86 | Temp 98.1°F | Ht 70.0 in | Wt 159.2 lb

## 2022-07-25 DIAGNOSIS — M7052 Other bursitis of knee, left knee: Secondary | ICD-10-CM | POA: Diagnosis not present

## 2022-07-25 DIAGNOSIS — Z23 Encounter for immunization: Secondary | ICD-10-CM | POA: Diagnosis not present

## 2022-07-25 DIAGNOSIS — R030 Elevated blood-pressure reading, without diagnosis of hypertension: Secondary | ICD-10-CM | POA: Diagnosis not present

## 2022-07-25 MED ORDER — LOSARTAN POTASSIUM 25 MG PO TABS: 25.0000 mg | ORAL_TABLET | Freq: Every day | ORAL | 0 refills | Status: DC

## 2022-07-25 NOTE — Assessment & Plan Note (Signed)
Would likely need an official diagnosis of hypertension at this point given several elevated pressures.  BMP updated, losartan 25 Mg ordered daily for blood pressure.  Check blood pressure daily for about 2 weeks and we will monitor from there.

## 2022-07-25 NOTE — Assessment & Plan Note (Signed)
Improving greatly on exam today.  Patient is asymptomatic and is hemodynamically stable in office today. Can continue with compression stockings and ice as needed for pain or swelling. Conservative treatment measures, no surgical intervention indicated.

## 2022-07-28 ENCOUNTER — Telehealth: Payer: Self-pay | Admitting: Family Medicine

## 2022-07-28 NOTE — Telephone Encounter (Signed)
Clinical info completed on DMV form.  Placed form in PCP's box for completion.    When form is completed, please route note to "RN Team" and place in wall pocket in front office.   Rayona Sardinha, CMA  

## 2022-07-28 NOTE — Telephone Encounter (Signed)
Patient's father dropped off DMV form to be completed. Last DOS was 07/25/22. Placed Blue folder.

## 2022-07-31 NOTE — Telephone Encounter (Signed)
Dr. Zigmund Daniel will be taking over as PCP for this patient per her request. I have notified her and she will respond to this patient's paperwork.

## 2022-08-04 NOTE — Telephone Encounter (Signed)
Form placed up front for pick up.   Copy made for batch scanning.   Mother aware.  

## 2022-08-08 ENCOUNTER — Encounter: Payer: Self-pay | Admitting: Student

## 2022-08-09 ENCOUNTER — Other Ambulatory Visit: Payer: Self-pay | Admitting: Student

## 2022-08-09 DIAGNOSIS — R030 Elevated blood-pressure reading, without diagnosis of hypertension: Secondary | ICD-10-CM

## 2022-08-09 DIAGNOSIS — H401332 Pigmentary glaucoma, bilateral, moderate stage: Secondary | ICD-10-CM | POA: Diagnosis not present

## 2022-08-09 MED ORDER — LOSARTAN POTASSIUM 25 MG PO TABS: 25.0000 mg | ORAL_TABLET | Freq: Every day | ORAL | 1 refills | Status: DC

## 2022-08-09 NOTE — Progress Notes (Signed)
Refill sent.

## 2022-08-15 DIAGNOSIS — Z23 Encounter for immunization: Secondary | ICD-10-CM | POA: Diagnosis not present

## 2022-08-17 ENCOUNTER — Encounter: Payer: Self-pay | Admitting: Student

## 2022-08-29 DIAGNOSIS — H401113 Primary open-angle glaucoma, right eye, severe stage: Secondary | ICD-10-CM | POA: Diagnosis not present

## 2022-09-01 ENCOUNTER — Encounter: Payer: Self-pay | Admitting: Student

## 2022-09-06 ENCOUNTER — Other Ambulatory Visit: Payer: Self-pay | Admitting: Student

## 2022-09-06 DIAGNOSIS — Z993 Dependence on wheelchair: Secondary | ICD-10-CM

## 2022-09-14 ENCOUNTER — Telehealth: Payer: Self-pay

## 2022-09-14 NOTE — Telephone Encounter (Signed)
Received VM rom Vaughan Basta at [facility] regarding rx for wheelchair repairs.   She states that the prescription is missing required documentation. Unable to determine from VM everything that was needed.   Returned call to [place], she was on another call, left message asking for returned call to discuss issues further. Placed previously faxed rx on my desk for when she calls back with needed documentation.   Talbot Grumbling, RN

## 2022-09-19 ENCOUNTER — Ambulatory Visit (INDEPENDENT_AMBULATORY_CARE_PROVIDER_SITE_OTHER): Payer: Medicare Other | Admitting: Student

## 2022-09-19 ENCOUNTER — Encounter: Payer: Self-pay | Admitting: Student

## 2022-09-19 ENCOUNTER — Ambulatory Visit: Payer: Medicare Other | Admitting: Pharmacist

## 2022-09-19 VITALS — BP 99/70 | HR 73 | Wt 160.0 lb

## 2022-09-19 DIAGNOSIS — R030 Elevated blood-pressure reading, without diagnosis of hypertension: Secondary | ICD-10-CM | POA: Diagnosis not present

## 2022-09-19 DIAGNOSIS — E785 Hyperlipidemia, unspecified: Secondary | ICD-10-CM

## 2022-09-19 DIAGNOSIS — Z993 Dependence on wheelchair: Secondary | ICD-10-CM

## 2022-09-19 NOTE — Assessment & Plan Note (Addendum)
Repeat Lipid panel today, non-fasting and not on statin

## 2022-09-19 NOTE — Patient Instructions (Addendum)
It was great to see you today! Thank you for choosing [facility] for your primary care. Edward Alvarado was seen for blood pressure follow up.  Today we addressed: Continuing with your losartan for now and we will follow up with pharmacy team for possible 24 hours monitor  I will obtain an LDL level for bad cholesterol and let you know the results   If you haven't already, sign up for My Chart to have easy access to your labs results, and communication with your primary care physician.  I recommend that you always bring your medications to each appointment as this makes it easy to ensure you are on the correct medications and helps [place] not miss refills when you need them. Call the clinic at [phone] if your symptoms worsen or you have any concerns.  You should return to our clinic Return in about 4 weeks (around 10/17/2022) for HTN. Please arrive 15 minutes before your appointment to ensure smooth check in process.  We appreciate your efforts in making this happen.  Thank you for allowing me to participate in your care, Erskine Emery, MD 09/19/2022, 9:58 AM PGY-2, [facility] Blood Pressure Activity Diary Time Lying down/ Sleeping Walking/ Exercise Stressed/ Angry Headache/ Pain Dizzy  9 AM       10 AM       11 AM       12 PM       1 PM       2 PM       Time Lying down/ Sleeping Walking/ Exercise Stressed/ Angry Headache/ Pain Dizzy  3 PM       4 PM        5 PM       6 PM       7 PM       8 PM       Time Lying down/ Sleeping Walking/ Exercise Stressed/ Angry Headache/ Pain Dizzy  9 PM       10 PM       11 PM       12 AM       1 AM       2 AM       3 AM       Time Lying down/ Sleeping Walking/ Exercise Stressed/ Angry Headache/ Pain Dizzy  4 AM       5 AM       6 AM       7 AM       8 AM       9 AM       10 AM        Time you woke up: _________                  Time you went to sleep:__________  Come back tomorrow at 8:30 AM to have the  monitor removed Call the [facility] if you have any questions before then ([phone])  Wearing the Blood Pressure Monitor The cuff will inflate every 20 minutes during the day and every 30 minutes while you sleep. Your blood pressure readings will NOT display after cuff inflation Fill out the blood pressure-activity diary during the day, especially during activities that may affect your reading -- such as exercise, stress, walking, taking your blood pressure medications  Important things to know: Avoid taking the monitor off for the next 24 hours, unless it causes you discomfort or pain.  Do NOT get the monitor wet and do NOT dry to clean the monitor with any cleaning products. Do NOT put the monitor on anyone else's arm. When the cuff inflates, avoid excess movement. Let the cuffed arm hang loosely, slightly away from the body. Avoid flexing the muscles or moving the hand/fingers. When you go to sleep, make sure that the hose is not kinked. Remember to fill out the blood pressure activity diary. If you experience severe pain or unusual pain (not associated with getting your blood pressure checked), remove the monitor.  Troubleshooting:  Code  Troubleshooting   1  Check cuff position, tighten cuff   2, 3  Remain still during reading   4, 87  Check air hose connections and make sure cuff is tight   85, 89  Check hose connections and make tubing is not crimped   86  Push START/STOP to restart reading   88, 91  Retry by pushing START/STOP   90  Replace batteries. If problem persists, remove monitor and bring back to   clinic at follow up   97, 98, 99  Service required - Remove monitor and bring back to clinic at follow up

## 2022-09-19 NOTE — Assessment & Plan Note (Addendum)
BP: 99/70 today. Goal of <140/90. Continue to work on healthy dietary habits and exercise.  Large discrepancy between blood pressure obtained here and home blood pressures.  There is concern for lack of calibration of home monitor.  I would not increase losartan until we have a definitive accurate blood pressure.  With the assistance of pharmacy team, will likely continue with 24-hour blood pressure monitoring and have them follow-up.  Continue with the losartan 25 until follow-up.  Will discuss plan with pharmacy team, appreciate their assistance.  Provided letter to parents for group home for Ed to have sent amount of coffee and sodas a day in addition to trying to maintain Mediterranean diet as able.  Medication regimen: Losartan 25 mg

## 2022-09-19 NOTE — Progress Notes (Signed)
  SUBJECTIVE:   CHIEF COMPLAINT / HPI:   Hypertension: BP: 99/70 today. Home medications include: Losartan 25 mg daily. He endorses taking these medications as prescribed. Does check blood pressure at home, range 120s-140s/60s-90s. Diet Unchanged and varied. Most recent creatinine trend:  Lab Results  Component Value Date   CREATININE 0.84 06/22/2022   CREATININE 0.79 06/19/2022   CREATININE 0.76 06/26/2021   Patient has had a BMP in the past 1 year.  PERTINENT  PMH / PSH: cerebral palsy, bilateral conductive hearing loss, HTN   OBJECTIVE:  BP 99/70   Pulse 73   Wt 160 lb (72.6 kg)   SpO2 100%   BMI 22.96 kg/m  Physical Exam Constitutional:      General: He is not in acute distress.    Appearance: Normal appearance. He is not ill-appearing.     Comments: Wheelchair bound with chronic contractions   HENT:     Head: Normocephalic and atraumatic.     Nose: Nose normal.  Cardiovascular:     Rate and Rhythm: Normal rate and regular rhythm.  Pulmonary:     Effort: Pulmonary effort is normal.  Abdominal:     General: Abdomen is flat. Bowel sounds are normal.     Palpations: Abdomen is soft.  Musculoskeletal:        General: Normal range of motion.  Skin:    General: Skin is warm.     Capillary Refill: Capillary refill takes less than 2 seconds.  Neurological:     General: No focal deficit present.     Mental Status: He is alert.  Psychiatric:        Mood and Affect: Mood normal.        Behavior: Behavior normal.      ASSESSMENT/PLAN:  Wheelchair dependence Assessment & Plan: Provided rx to [employer] for wheelchair repairs.  Second attempt to correct rx sent today, gave direct phone call to notify if there are any changes to be made of rx.    Hyperlipidemia, unspecified hyperlipidemia type Assessment & Plan: Repeat Lipid panel today, non-fasting and not on statin   Orders: -     Lipid panel  Elevated BP without diagnosis of hypertension Assessment &  Plan: BP: 99/70 today. Goal of <140/90. Continue to work on healthy dietary habits and exercise.  Large discrepancy between blood pressure obtained here and home blood pressures.  There is concern for lack of calibration of home monitor.  I would not increase losartan until we have a definitive accurate blood pressure.  With the assistance of pharmacy team, will likely continue with 24-hour blood pressure monitoring and have them follow-up.  Continue with the losartan 25 until follow-up.  Will discuss plan with pharmacy team, appreciate their assistance.  Provided letter to parents for group home for Ed to have sent amount of coffee and sodas a day in addition to trying to maintain Mediterranean diet as able.  Medication regimen: Losartan 25 mg      Return in about 4 weeks (around 10/17/2022) for HTN. Erskine Emery, MD 09/19/2022, 12:02 PM PGY-2, [place]

## 2022-09-19 NOTE — Assessment & Plan Note (Signed)
Provided rx to [facility] for wheelchair repairs.  Second attempt to correct rx sent today, gave direct phone call to notify if there are any changes to be made of rx.

## 2022-09-20 ENCOUNTER — Ambulatory Visit (INDEPENDENT_AMBULATORY_CARE_PROVIDER_SITE_OTHER): Payer: Medicare Other | Admitting: Pharmacist

## 2022-09-20 DIAGNOSIS — R030 Elevated blood-pressure reading, without diagnosis of hypertension: Secondary | ICD-10-CM

## 2022-09-20 LAB — LIPID PANEL
Chol/HDL Ratio: 6.4 ratio — ABNORMAL HIGH (ref 0.0–5.0)
Cholesterol, Total: 237 mg/dL — ABNORMAL HIGH (ref 100–199)
HDL: 37 mg/dL — ABNORMAL LOW (ref >39)
LDL Chol Calc (NIH): 175 mg/dL — ABNORMAL HIGH (ref 0–99)
Triglycerides: 135 mg/dL (ref 0–149)
VLDL Cholesterol Cal: 25 mg/dL (ref 5–40)

## 2022-09-20 NOTE — Assessment & Plan Note (Signed)
History of elevated blood pressure, currently taking; losartan 25mg  daily with goal presssure of <130/80.     Found to have persistently mildly elevated blood pressure with 24-hour ambulatory blood pressure evaluation which demonstrates an average AWAKE blood pressure of 138/88 mmHg.  Nocturnal dipping pattern is normal.   Changes to medications - No changes were made over phone.  - Discussed possible increase from losartan 25mg  to 50mg  once daily.  I shared that I would communicate this suggested change to PCP, Dr. Zigmund Daniel.  If she agrees with adjustment, new prescription can be sent for 50mg  tablet once daily to [facility].

## 2022-09-20 NOTE — Patient Instructions (Signed)
Consideration for increase from losartan 25 mg to 50 mg by PCP, Dr. Zigmund Daniel.   Use of MyChart to communicate final decision was acceptable to patient guardian (mother).

## 2022-09-20 NOTE — Progress Notes (Signed)
   S:     Chief Complaint  Patient presents with   Medication Management    Amb BP Monitor Follow-Up   41 y.o. male who presented for hypertension evaluation, education, and management as part of PCP visit on 09/19/2022.   Ambulatory BP monitor placed and returned on 09/20/2022.  Due to schedule conflicts - results were communicated via phone to patient's legal   guardian (mother) - Hassan Rowan.    PMH is significant for hypertension, and cerebral palsy.  Patient was referred and last seen by Primary Care Provider, Dr. Zigmund Daniel, on 09/19/2022.   Medication compliance is reported to be good with losartan 25mg  daily (patient lives at a group home).  Monitor returned to clinic without any issues reported.  Reported able to wear the Ambulatory Blood Pressure Cuff for the entire 24 evaluation period.   O:   Last 3 Office BP readings: BP Readings from Last 3 Encounters:  09/19/22 99/70  07/25/22 (!) 152/94  06/29/22 235/36    Basic Metabolic Panel    Component Value Date/Time   NA 140 06/22/2022 1055   NA 139 12/11/2019 1027   K 4.2 06/22/2022 1055   CL 105 06/22/2022 1055   CO2 28 06/22/2022 1055   GLUCOSE 88 06/22/2022 1055   BUN 16 06/22/2022 1055   BUN 15 12/11/2019 1027   CREATININE 0.84 06/22/2022 1055   CREATININE 0.84 06/20/2016 0825   CALCIUM 9.7 06/22/2022 1055   GFRNONAA >60 06/22/2022 1055   GFRNONAA >89 06/20/2016 0825   GFRAA 129 12/11/2019 1027   GFRAA >89 06/20/2016 0825     ABPM Study Data: Arm Placement left arm  Overall Mean 24hr BP:   129/80 mmHg  HR: 80  Daytime Mean BP:  138/88 mmHg  HR: 86  Nighttime Mean BP:  116/68 mmHg  HR: 72  Dipping Pattern: Yes.    Sys:   15.7%   Dia: 22.2%   [normal dipping ~10-20%]   For Office Goal Goal BP of <130/80:  ABPM thresholds: Overall BP < 125/75, daytime BP <130/80 mmHg, sleeptime BP <110/65 mmHg    A/P: History of elevated blood pressure, currently taking; losartan 25mg  daily with goal presssure of <130/80.      Found to have persistently mildly elevated blood pressure with 24-hour ambulatory blood pressure evaluation which demonstrates an average AWAKE blood pressure of 138/88 mmHg.  Nocturnal dipping pattern is normal.   Changes to medications - No changes were made over phone.  - Discussed possible increase from losartan 25mg  to 50mg  once daily.  I shared that I would communicate this suggested change to PCP, Dr. Zigmund Daniel.  If she agrees with adjustment, new prescription can be sent for 50mg  tablet once daily to [facility].   Results reviewed and written information provided.    Written patient instructions provided. Patient verbalized understanding of treatment plan.  Total time in telephone encounter 12 minutes.    Follow-up:  Pharmacist None. PCP clinic visit in PRN.  Patient seen with Geraldo Docker, PharmD Candidate and Park Liter, PharmD Candidate at time of placement on 09/19/2022. Marland Kitchen

## 2022-09-25 ENCOUNTER — Other Ambulatory Visit: Payer: Self-pay | Admitting: Student

## 2022-09-25 DIAGNOSIS — R030 Elevated blood-pressure reading, without diagnosis of hypertension: Secondary | ICD-10-CM

## 2022-09-25 MED ORDER — LOSARTAN POTASSIUM 50 MG PO TABS
25.0000 mg | ORAL_TABLET | Freq: Every day | ORAL | 2 refills | Status: DC
Start: 2022-09-25 — End: 2022-09-26

## 2022-09-25 NOTE — Progress Notes (Signed)
Increased to Losartan 50 mg with assistance of Pharmacy

## 2022-09-26 ENCOUNTER — Telehealth: Payer: Self-pay

## 2022-09-26 ENCOUNTER — Other Ambulatory Visit: Payer: Self-pay | Admitting: Student

## 2022-09-26 DIAGNOSIS — R030 Elevated blood-pressure reading, without diagnosis of hypertension: Secondary | ICD-10-CM

## 2022-09-26 MED ORDER — LOSARTAN POTASSIUM 50 MG PO TABS: 50.0000 mg | ORAL_TABLET | Freq: Every day | ORAL | 2 refills | Status: DC

## 2022-09-26 NOTE — Telephone Encounter (Signed)
Pharmacy calls nurse line in regards to recent Losartan prescription.   Losartan 50mg  was called in, however the directions still state take 0.5mg .   Please resend in Losartan 50mg .

## 2022-09-30 ENCOUNTER — Telehealth: Payer: Self-pay

## 2022-09-30 NOTE — Telephone Encounter (Signed)
[facility] calls nurse line requesting a new RX for wheelchair repairs.   They report they did receive the original, however the RX needs to be signed by an attending only.   They faxed over information regarding this.   In PCP box for review.

## 2022-10-10 NOTE — Telephone Encounter (Signed)
Received call from [facility].   Was this document received and signed by an attending?  They are reporting they still have not received document back.

## 2022-10-18 ENCOUNTER — Ambulatory Visit (INDEPENDENT_AMBULATORY_CARE_PROVIDER_SITE_OTHER): Payer: Medicare Other | Admitting: Student

## 2022-10-18 ENCOUNTER — Encounter: Payer: Self-pay | Admitting: Student

## 2022-10-18 VITALS — BP 142/96 | HR 73 | Wt 161.1 lb

## 2022-10-18 DIAGNOSIS — I10 Essential (primary) hypertension: Secondary | ICD-10-CM

## 2022-10-18 MED ORDER — LOSARTAN POTASSIUM-HCTZ 50-12.5 MG PO TABS: 1.0000 | ORAL_TABLET | Freq: Every day | ORAL | 2 refills | Status: DC

## 2022-10-18 NOTE — Patient Instructions (Addendum)
It was great to see you today! Thank you for choosing [facility] for your primary care. Edward Alvarado was seen for blood pressure follow up.  Today we addressed: We will continue with a different medication called Hyzaar and will completely discontinue losartan  Please keep monitoring blood pressure daily for the next 6 weeks  I will see you again in six weeks  We will also try to keep a mediterranean diet as able    If you haven't already, sign up for My Chart to have easy access to your labs results, and communication with your primary care physician.  I recommend that you always bring your medications to each appointment as this makes it easy to ensure you are on the correct medications and helps [place] not miss refills when you need them. Call the clinic at [phone] if your symptoms worsen or you have any concerns.  You should return to our clinic Return in about 6 weeks (around 11/29/2022) for Blood pressure . Please arrive 15 minutes before your appointment to ensure smooth check in process.  We appreciate your efforts in making this happen.  Thank you for allowing me to participate in your care, [name], MD 10/18/2022, 9:40 AM PGY-2, [facility]

## 2022-10-18 NOTE — Progress Notes (Signed)
  SUBJECTIVE:   CHIEF COMPLAINT / HPI:   Hypertension: BP: (!) 142/96 today. Home medications include: Losartan 50mg  after ambulatory monitor showed elevation in BP 138/88 on average. He endorses taking these medications as prescribed. Does check blood pressure at home. Diet: still typical at group home. 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 Trend pprovided is 130s-140s/90s.  Most recent creatinine trend:  Lab Results  Component Value Date   CREATININE 0.84 06/22/2022   CREATININE 0.79 06/19/2022   CREATININE 0.76 06/26/2021   Patient has had a BMP in the past 1 year.   PERTINENT  PMH / PSH:  Allergic rhinitis, CP HLD Thrombocytopenia Prepatellar bursitis  Glaucoma Scoliosis  Deafness   OBJECTIVE:  BP (!) 142/96   Pulse 73   Wt 161 lb 2 oz (73.1 kg)   SpO2 100%   BMI 23.12 kg/m  Physical Exam  General: Alert and oriented in no apparent distress; sitting in wheelchair, chronically contracted in extremities, interpreter present  Heart: Regular rate and rhythm with no murmurs appreciated Lungs: Normal WOB  Skin: Warm and dry Extremities: No lower extremity edema  Filed Weights   10/18/22 0911  Weight: 161 lb 2 oz (73.1 kg)    ASSESSMENT/PLAN:  Primary hypertension Assessment & Plan: BP: (!) 142/96 today.  Moderately controlled . Goal of <130/80. Continue to work on healthy dietary habits and exercise. Follow up in 6 weeks.   Medication regimen: Changed to Hyzaar Losartan 50-HCTZ 12.5  Recheck BMP in 6 weeks, last K 4.2 on BMP  Monitor daily BP for trend  Mediterranean diet  Epworth of 8-Higher normal daytime sleepiness, STOP BANG 3--not quite meeting criteria for OSA although he could be at higher likelihood given contractures from CP contribution. Will recheck at next visit.    Orders: -     Losartan Potassium-HCTZ; Take 1 tablet by mouth daily.  Dispense: 30 tablet;  Refill: 2   Return in about 6 weeks (around 11/29/2022) for Blood pressure . 12/01/2022, MD 10/18/2022, 9:53 AM PGY-2, [facility]

## 2022-10-18 NOTE — Assessment & Plan Note (Signed)
BP: (!) 142/96 today.  Moderately controlled . Goal of <130/80. Continue to work on healthy dietary habits and exercise. Follow up in 6 weeks.   Medication regimen: Changed to Hyzaar Losartan 50-HCTZ 12.5  Recheck BMP in 6 weeks, last K 4.2 on BMP  Monitor daily BP for trend  Mediterranean diet  Epworth of 8-Higher normal daytime sleepiness, STOP BANG 3--not quite meeting criteria for OSA although he could be at higher likelihood given contractures from CP contribution. Will recheck at next visit.

## 2022-11-29 ENCOUNTER — Ambulatory Visit (INDEPENDENT_AMBULATORY_CARE_PROVIDER_SITE_OTHER): Payer: Medicare Other | Admitting: Student

## 2022-11-29 ENCOUNTER — Encounter: Payer: Self-pay | Admitting: Student

## 2022-11-29 VITALS — BP 145/95 | HR 81 | Wt 159.2 lb

## 2022-11-29 DIAGNOSIS — I10 Essential (primary) hypertension: Secondary | ICD-10-CM

## 2022-11-29 MED ORDER — LOSARTAN POTASSIUM-HCTZ 100-12.5 MG PO TABS: 1.0000 | ORAL_TABLET | Freq: Every day | ORAL | 3 refills | Status: DC

## 2022-11-29 NOTE — Patient Instructions (Addendum)
It was great to see you today! Thank you for choosing [facility] for your primary care. Edward Alvarado was seen for follow up.  Today we addressed: Please drink at least 80 ounces of water a DAY, a minimum of 70 ounces  I have started Hyzaar 100-12.5, prescription was sent to [facility]. Discontinue the Hyzaar 50-12.5 We will check a cholesterol level in 3 months, needs an earlier appt  I strongly recommend no more than 2,300 milligrams (mg) of salt a day and an ideal limit of no more than 1,500 mg per day   If you haven't already, sign up for My Chart to have easy access to your labs results, and communication with your primary care physician.  I recommend that you always bring your medications to each appointment as this makes it easy to ensure you are on the correct medications and helps [place] not miss refills when you need them. Call the clinic at [phone] if your symptoms worsen or you have any concerns.  You should return to our clinic Return in about 3 months (around 02/28/2023) for Needs first visit of the day for BP and cholesterol . Please arrive 15 minutes before your appointment to ensure smooth check in process.  We appreciate your efforts in making this happen.  Thank you for allowing me to participate in your care, Erskine Emery, MD 11/29/2022, 4:29 PM PGY-2, [place]

## 2022-11-29 NOTE — Progress Notes (Signed)
  SUBJECTIVE:   CHIEF COMPLAINT / HPI:   Hypertension: BP: (!) 145/95 today. Home medications include: Hyzaar 50-12.5. He endorses taking these medications as prescribed. Does check blood pressure at home.Reports that they range 110s-140s/80s-90s. Diet attempts to eat less salt as able.   Most recent creatinine trend:  Lab Results  Component Value Date   CREATININE 0.84 06/22/2022   CREATININE 0.79 06/19/2022   CREATININE 0.76 06/26/2021   Patient has had a BMP in the past 1 year.   PERTINENT  PMH / PSH: Allergic rhinitis CP HLD Thrombocytopenia Prepatellar bursitis  Glaucoma Scoliosis  Deafness    OBJECTIVE:  BP (!) 145/95   Pulse 81   Wt 159 lb 3.2 oz (72.2 kg)   SpO2 99%   BMI 22.84 kg/m  Physical Exam  General: Alert and oriented in no apparent distress; sitting in wheelchair, chronically contracted in extremities, interpreter present  Heart: Regular rate and rhythm with no murmurs appreciated Lungs: Normal WOB  Skin: Warm and dry  ASSESSMENT/PLAN:  Primary hypertension Assessment & Plan: BP: (!) 145/95 today. Goal of <130/80. Continue to work on healthy dietary habits and exercise. Follow up in 3 months.   Medication regimen: Hyzaar increased to 100-12.5 and recommended specific daily salt intake goals and water intake.    Fasting lipid panel at next visit. No statins or medications for HLD currently.   Orders: -     Losartan Potassium-HCTZ; Take 1 tablet by mouth daily.  Dispense: 30 tablet; Refill: 3    For Office Goal Goal BP of <130/80:  ABPM thresholds: Overall BP < 125/75, daytime BP <130/80 mmHg, sleeptime BP <110/65 mmHg    Return in about 3 months (around 02/28/2023) for Needs first visit of the day for BP and cholesterol . Erskine Emery, MD 11/30/2022, 3:14 PM PGY-2, [place]

## 2022-11-30 ENCOUNTER — Encounter: Payer: Self-pay | Admitting: Student

## 2022-11-30 NOTE — Assessment & Plan Note (Signed)
BP: (!) 145/95 today. Goal of <130/80. Continue to work on healthy dietary habits and exercise. Follow up in 3 months.   Medication regimen: Hyzaar increased to 100-12.5 and recommended specific daily salt intake goals and water intake.    Fasting lipid panel at next visit. No statins or medications for HLD currently.

## 2023-01-17 DIAGNOSIS — H401332 Pigmentary glaucoma, bilateral, moderate stage: Secondary | ICD-10-CM | POA: Diagnosis not present

## 2023-02-03 ENCOUNTER — Telehealth: Payer: Self-pay | Admitting: Student

## 2023-02-03 NOTE — Telephone Encounter (Signed)
Father walked in and presented a letter from [employer] for ordering wheel chair parts. Letter placed in doctor's box.

## 2023-02-07 NOTE — Telephone Encounter (Signed)
Routed message to PCP. Claryce Friel, CMA  

## 2023-02-28 ENCOUNTER — Telehealth: Payer: Self-pay | Admitting: Student

## 2023-02-28 NOTE — Telephone Encounter (Signed)
Patient came in for a lab appt this morning, however lab did not have orders. Patient has an appt on 4/26 and wanted to know if they needed to get labs before the appt. Please call to let them know.

## 2023-02-28 NOTE — Telephone Encounter (Signed)
Routed message to PCP. Edward Alvarado, CMA  

## 2023-03-06 NOTE — Telephone Encounter (Signed)
Has appt on 4/26

## 2023-03-10 ENCOUNTER — Ambulatory Visit (INDEPENDENT_AMBULATORY_CARE_PROVIDER_SITE_OTHER): Payer: Medicare Other | Admitting: Student

## 2023-03-10 ENCOUNTER — Encounter: Payer: Self-pay | Admitting: Student

## 2023-03-10 VITALS — BP 124/68 | HR 74 | Wt 159.0 lb

## 2023-03-10 DIAGNOSIS — E785 Hyperlipidemia, unspecified: Secondary | ICD-10-CM | POA: Diagnosis not present

## 2023-03-10 DIAGNOSIS — R197 Diarrhea, unspecified: Secondary | ICD-10-CM | POA: Insufficient documentation

## 2023-03-10 DIAGNOSIS — I10 Essential (primary) hypertension: Secondary | ICD-10-CM | POA: Diagnosis not present

## 2023-03-10 NOTE — Progress Notes (Signed)
  SUBJECTIVE:   CHIEF COMPLAINT / HPI:   Comes with ASL interpreter   Hypertension: BP: 124/68 today. Home medications include: Hyzaar increased to 100-12.5 on last visit. He endorses taking these medications as prescribed. Does check blood pressure at home. Most recent creatinine trend:  Lab Results  Component Value Date   CREATININE 0.84 06/22/2022   CREATININE 0.79 06/19/2022   CREATININE 0.76 06/26/2021   Patient has had a BMP in the past 1 year.    HLD: Requests UTD lipid panel   PERTINENT  PMH / PSH:  Allergic rhinitis CP HLD Thrombocytopenia Prepatellar bursitis  Glaucoma Scoliosis  Deafness   Patient Care Team: [name], MD as PCP - General (Family Medicine) OBJECTIVE:  BP 124/68   Pulse 74   Wt 159 lb (72.1 kg)   SpO2 97%   BMI 22.81 kg/m  Physical Exam  General: Alert and oriented in no apparent distress; sitting in wheelchair, chronically contracted in extremities, interpreter present  Heart: Regular rate and rhythm with no murmurs appreciated Lungs: Normal WOB, CTAB Skin: Warm and dry   ASSESSMENT/PLAN:  Hyperlipidemia, unspecified hyperlipidemia type Assessment & Plan: Fasting panel ordered  Orders: -     Lipid panel  Diarrhea, unspecified type Assessment & Plan: H/o diarrhea but doing well with MetaMucil, discussed increasing dosage given.    Primary hypertension Assessment & Plan: BP: 124/68 today. Well controlled. Goal met. Continue to work on healthy dietary habits and exercise. Follow up in 3 months.   Medication regimen: Hyzaar     Return in about 3 months (around 06/09/2023) for HTN. [name], MD 03/10/2023, 12:20 PM PGY-2, [facility]

## 2023-03-10 NOTE — Patient Instructions (Addendum)
It was great to see you today! Thank you for choosing [facility] for your primary care. Edward Alvarado was seen for follow up.  Today we addressed: Continue with same medication Hyzaar 100-12.5 daily Check blood pressures every few days  Increase Metamucil to 2 scoops in 8 ounces of water   If you haven't already, sign up for My Chart to have easy access to your labs results, and communication with your primary care physician.  I recommend that you always bring your medications to each appointment as this makes it easy to ensure you are on the correct medications and helps [place] not miss refills when you need them. Call the clinic at [phone] if your symptoms worsen or you have any concerns.  You should return to our clinic Return in about 3 months (around 06/09/2023) for HTN. Please arrive 15 minutes before your appointment to ensure smooth check in process.  We appreciate your efforts in making this happen.  Thank you for allowing me to participate in your care, [name], MD 03/10/2023, 8:37 AM PGY-2, [facility]

## 2023-03-10 NOTE — Assessment & Plan Note (Signed)
BP: 124/68 today. Well controlled. Goal met. Continue to work on healthy dietary habits and exercise. Follow up in 3 months.   Medication regimen: Hyzaar

## 2023-03-10 NOTE — Assessment & Plan Note (Signed)
Fasting panel ordered

## 2023-03-10 NOTE — Assessment & Plan Note (Addendum)
H/o diarrhea but doing well with MetaMucil, discussed increasing dosage given.

## 2023-03-11 LAB — LIPID PANEL
Chol/HDL Ratio: 6.7 ratio — ABNORMAL HIGH (ref 0.0–5.0)
Cholesterol, Total: 227 mg/dL — ABNORMAL HIGH (ref 100–199)
HDL: 34 mg/dL — ABNORMAL LOW (ref >39)
LDL Chol Calc (NIH): 161 mg/dL — ABNORMAL HIGH (ref 0–99)
Triglycerides: 174 mg/dL — ABNORMAL HIGH (ref 0–149)
VLDL Cholesterol Cal: 32 mg/dL (ref 5–40)

## 2023-03-14 ENCOUNTER — Other Ambulatory Visit: Payer: Self-pay | Admitting: Student

## 2023-03-14 DIAGNOSIS — I10 Essential (primary) hypertension: Secondary | ICD-10-CM

## 2023-03-23 ENCOUNTER — Encounter: Payer: Self-pay | Admitting: Student

## 2023-03-28 DIAGNOSIS — H401332 Pigmentary glaucoma, bilateral, moderate stage: Secondary | ICD-10-CM | POA: Diagnosis not present

## 2023-03-30 ENCOUNTER — Telehealth: Payer: Self-pay | Admitting: Student

## 2023-03-30 NOTE — Telephone Encounter (Signed)
Called patient to schedule Medicare Annual Wellness Visit (AWV). Left message for patient to call back and schedule Medicare Annual Wellness Visit (AWV).  Last date of AWV: AWVI eligible as of 06/14/2016   Please schedule an AWVI appointment at any time with [facility] VISIT.  If any questions, please contact me at [phone].    Thank you,  [facility] Support [facility] Direct dial  [phone]

## 2023-04-13 ENCOUNTER — Other Ambulatory Visit: Payer: Self-pay | Admitting: Student

## 2023-04-13 DIAGNOSIS — S8992XA Unspecified injury of left lower leg, initial encounter: Secondary | ICD-10-CM

## 2023-04-20 MED ORDER — MONTELUKAST SODIUM 10 MG PO TABS: 10.0000 mg | ORAL_TABLET | Freq: Every day | ORAL | 11 refills | Status: DC

## 2023-04-20 MED ORDER — VITAMIN D3 25 MCG (1000 UNIT) PO TABS
1000.0000 [IU] | ORAL_TABLET | Freq: Every day | ORAL | 11 refills | Status: DC
Start: 2023-04-20 — End: 2024-03-22

## 2023-06-08 ENCOUNTER — Ambulatory Visit (INDEPENDENT_AMBULATORY_CARE_PROVIDER_SITE_OTHER): Payer: Medicare Other | Admitting: Student

## 2023-06-08 ENCOUNTER — Encounter: Payer: Self-pay | Admitting: Student

## 2023-06-08 VITALS — BP 121/79 | HR 81 | Ht 70.0 in | Wt 152.0 lb

## 2023-06-08 DIAGNOSIS — I10 Essential (primary) hypertension: Secondary | ICD-10-CM | POA: Diagnosis not present

## 2023-06-08 NOTE — Progress Notes (Cosign Needed Addendum)
  SUBJECTIVE:   CHIEF COMPLAINT / HPI:   Hypertension: BP: 121/79 today. Home medications include: Hyzaar 100-12.5. He endorses taking these medications as prescribed. Does check blood pressure at home.  Most recent creatinine trend:  Lab Results  Component Value Date   CREATININE 0.84 06/22/2022   CREATININE 0.79 06/19/2022   CREATININE 0.76 06/26/2021   Patient has not had a BMP in the past 1 year.   PERTINENT  PMH / PSH:   Allergic rhinitis Cerebral palsy  HLD Thrombocytopenia Prepatellar bursitis  Glaucoma Scoliosis  Deafness   Patient Care Team: [name], MD as PCP - General (Family Medicine) OBJECTIVE:  BP 121/79   Pulse 81   Ht 5\' 10"  (1.778 m)   Wt 152 lb (68.9 kg)   SpO2 100%   BMI 21.81 kg/m  Physical Exam  General: Alert in no apparent distress; sitting in wheelchair, chronically contracted in extremities, interpreter present  HEENT: No LAD Heart: Regular rate and rhythm with no murmurs appreciated Lungs: Normal WOB  Skin: Warm and dry  ASSESSMENT/PLAN:  Primary hypertension Assessment & Plan: BP: 121/79 today. Well controlled. Follow up in 3 months.   Medication regimen: Hyzaar   Orders: -     Comprehensive metabolic panel   Return in about 3 months (around 09/08/2023) for HTN. [name], MD 06/08/2023, 3:57 PM PGY-3, [facility]

## 2023-06-08 NOTE — Assessment & Plan Note (Signed)
BP: 121/79 today. Well controlled. Follow up in 3 months.   Medication regimen: Hyzaar

## 2023-06-08 NOTE — Patient Instructions (Addendum)
It was great to see you today! Thank you for choosing [facility] for your primary care. Edward Alvarado was seen for follow up.  Today we addressed: Great job! See you in 3 months  If you haven't already, sign up for My Chart to have easy access to your labs results, and communication with your primary care physician.  We are checking some labs today. If they are abnormal, I will call you. If they are normal, I will send you a MyChart message (if it is active) or a letter in the mail. If you do not hear about your labs in the next 2 weeks, please call the office. I recommend that you always bring your medications to each appointment as this makes it easy to ensure you are on the correct medications and helps [place] not miss refills when you need them. Call the clinic at [phone] if your symptoms worsen or you have any concerns.  You should return to our clinic Return in about 3 months (around 09/08/2023) for HTN. Please arrive 15 minutes before your appointment to ensure smooth check in process.  We appreciate your efforts in making this happen.  Thank you for allowing me to participate in your care, [name], MD 06/08/2023, 3:51 PM PGY-3, [facility]

## 2023-06-09 LAB — COMPREHENSIVE METABOLIC PANEL: Calcium: 9.6 mg/dL (ref 8.7–10.2)

## 2023-06-26 ENCOUNTER — Encounter: Payer: Self-pay | Admitting: Student

## 2023-06-26 DIAGNOSIS — R0982 Postnasal drip: Secondary | ICD-10-CM

## 2023-06-26 MED ORDER — ONE-A-DAY MENS PO TABS: 1.0000 | ORAL_TABLET | Freq: Every day | ORAL | 11 refills | Status: DC

## 2023-06-26 MED ORDER — FLUTICASONE PROPIONATE 50 MCG/ACT NA SUSP: 2.0000 | Freq: Every day | NASAL | 11 refills | Status: DC

## 2023-07-06 ENCOUNTER — Other Ambulatory Visit: Payer: Self-pay | Admitting: Student

## 2023-07-06 DIAGNOSIS — I10 Essential (primary) hypertension: Secondary | ICD-10-CM

## 2023-07-13 ENCOUNTER — Telehealth: Payer: Self-pay | Admitting: Student

## 2023-07-13 NOTE — Telephone Encounter (Signed)
Father of the patient walked in and left a prescription request from [facility]  The request has been placed in the doctor's box.

## 2023-07-28 ENCOUNTER — Encounter: Payer: Self-pay | Admitting: Student

## 2023-07-28 DIAGNOSIS — Z23 Encounter for immunization: Secondary | ICD-10-CM | POA: Diagnosis not present

## 2023-08-01 DIAGNOSIS — H35353 Cystoid macular degeneration, bilateral: Secondary | ICD-10-CM | POA: Diagnosis not present

## 2023-08-01 DIAGNOSIS — H401332 Pigmentary glaucoma, bilateral, moderate stage: Secondary | ICD-10-CM | POA: Diagnosis not present

## 2023-08-28 ENCOUNTER — Encounter: Payer: Self-pay | Admitting: Student

## 2023-08-29 ENCOUNTER — Ambulatory Visit (INDEPENDENT_AMBULATORY_CARE_PROVIDER_SITE_OTHER): Payer: Medicare Other | Admitting: Student

## 2023-08-29 ENCOUNTER — Encounter: Payer: Self-pay | Admitting: Student

## 2023-08-29 VITALS — BP 128/85 | HR 78 | Ht 70.0 in | Wt 161.5 lb

## 2023-08-29 DIAGNOSIS — I10 Essential (primary) hypertension: Secondary | ICD-10-CM

## 2023-08-29 DIAGNOSIS — H900 Conductive hearing loss, bilateral: Secondary | ICD-10-CM

## 2023-08-29 DIAGNOSIS — G800 Spastic quadriplegic cerebral palsy: Secondary | ICD-10-CM

## 2023-08-29 MED ORDER — DEBROX 6.5 % OT SOLN: 5.0000 [drp] | Freq: Two times a day (BID) | OTIC | 0 refills | Status: DC

## 2023-08-29 NOTE — Progress Notes (Signed)
    SUBJECTIVE:   CHIEF COMPLAINT / HPI:   Hypertension: BP: 128/85 today. Home medications include: Hyzaar 100-12.5.  He endorses taking these medications as prescribed. Present with parents and ASL interpreter, parents providing most history. Checked history of BP 110-130s/70s-80s.   Most recent creatinine trend:  Lab Results  Component Value Date   CREATININE 0.85 06/08/2023   CREATININE 0.84 06/22/2022   CREATININE 0.79 06/19/2022   Patient has had a BMP in the past 1 year.  Hearing concern: Dad reports that he thinks it may have the ability to hear more than I thought originally.  He does have 90% hearing loss but has not had his hearing checked in many years.  Dad reports that at will sometimes turn down the TV and will respond to dad when he is just speaking to him.  Would like him to be reevaluated.  Requesting refill of Debrox.  They are also trying to move him to another group home and provided an [place] 2 form for me to fill out.  PERTINENT  PMH / PSH:  -Cerebral Palsy -Hearing loss  -Thrombocytopenia  -HLD  -Vitamin D deficiency -HTN   OBJECTIVE:   BP 128/85   Pulse 78   Ht 5\' 10"  (1.778 m)   Wt 161 lb 8 oz (73.3 kg)   SpO2 98%   BMI 23.17 kg/m   General: Alert and oriented in no apparent distress Head: /AT.   Eyes:  PERRL.   Ears:  External ears WNL, Bilateral TM's normal without retraction, redness or bulging.  Cerumen present bilaterally Nose:  Septum midline  Mouth:  MMM, tonsils non-erythematous, non-edematous.   Heart: Regular rate and rhythm with no murmurs appreciated Lungs: CTA bilaterally, no wheezing Abdomen: Bowel sounds present, no abdominal pain Skin: Warm and dry   ASSESSMENT/PLAN:   Assessment & Plan LOSS, CONDUCTIVE HEARING, BILATERAL Referring the patient to audiology for ABR, may need to see ENT ultimately.  Refilled his Debrox upon request. Primary hypertension Well-controlled on current medication regimen. Spastic quadriplegic  cerebral palsy (HCC) Will fill out [place] 2 form and have faculty sign for the patient, will have father come pick up once completed.     [name], MD [facility]

## 2023-08-29 NOTE — Patient Instructions (Addendum)
It was great to see you today! Thank you for choosing [facility] for your primary care.  Today we addressed: Continue with the blood pressure medication, message me with any refill need  2. Refer to audiology for them to performed the test and then to ENT  3. Debrox 5 drops in each ear twice a day  If you haven't already, sign up for My Chart to have easy access to your labs results, and communication with your primary care physician. I recommend that you always bring your medications to each appointment as this makes it easy to ensure you are on the correct medications and helps [place] not miss refills when you need them. Call the clinic at [phone] if your symptoms worsen or you have any concerns. Return in about 3 months (around 11/29/2023). Please arrive 15 minutes before your appointment to ensure smooth check in process.  We appreciate your efforts in making this happen.  Thank you for allowing me to participate in your care, [name], MD 08/29/2023, 10:27 AM PGY-3, [facility]

## 2023-08-30 NOTE — Assessment & Plan Note (Signed)
Will fill out FL 2 form and have faculty sign for the patient, will have father come pick up once completed.

## 2023-08-30 NOTE — Assessment & Plan Note (Signed)
Referring the patient to audiology for ABR, may need to see ENT ultimately.  Refilled his Debrox upon request.

## 2023-08-30 NOTE — Assessment & Plan Note (Signed)
Well-controlled on current medication regimen

## 2023-08-31 ENCOUNTER — Telehealth: Payer: Self-pay

## 2023-08-31 NOTE — Telephone Encounter (Signed)
-----   Message from [facility] Burton S sent at 08/31/2023  3:36 PM EDT -----  ----- Message ----- From: [name], MD Sent: 08/31/2023   7:35 AM EDT To: [facility] Rn Team  Filled out FL2 form and placed in [facility] box to be signed and placed in RN box. I took a picture in chart, and dad needs a call when complete to come pick up form from the clinic.  Allee

## 2023-09-07 NOTE — Telephone Encounter (Signed)
FL2 placed up front for pick up.   Copy made for batch scanning.   Mother aware.

## 2023-09-12 DIAGNOSIS — H401332 Pigmentary glaucoma, bilateral, moderate stage: Secondary | ICD-10-CM | POA: Diagnosis not present

## 2023-09-12 DIAGNOSIS — H35351 Cystoid macular degeneration, right eye: Secondary | ICD-10-CM | POA: Diagnosis not present

## 2023-10-02 ENCOUNTER — Ambulatory Visit: Payer: Medicare Other | Admitting: Audiologist

## 2023-10-02 ENCOUNTER — Telehealth: Payer: Self-pay | Admitting: Audiologist

## 2023-10-02 ENCOUNTER — Telehealth: Payer: Self-pay | Admitting: Student

## 2023-10-02 NOTE — Telephone Encounter (Signed)
[facility] called about referral stating they do not do ABR for adult patients. They stated to send patient somewhere that sedates, possibly [facility].   Please Advise.   Thanks!

## 2023-10-04 ENCOUNTER — Other Ambulatory Visit: Payer: Self-pay

## 2023-10-04 DIAGNOSIS — R197 Diarrhea, unspecified: Secondary | ICD-10-CM

## 2023-10-04 MED ORDER — METAMUCIL PREMIUM BLEND 52.63 % PO POWD
1.0000 | Freq: Every day | ORAL | 3 refills | Status: DC
Start: 2023-10-04 — End: 2023-10-09

## 2023-10-06 ENCOUNTER — Telehealth: Payer: Self-pay

## 2023-10-06 NOTE — Telephone Encounter (Signed)
Patient's father calls nurse line regarding change in fiber supplement.   He states that prescription from 11/20 is a different brand that they have not used before.   He is requeting that they receive a prescription for METAMUCIL SMOOTH TEXTURE 58.6 % powder , as this is what they have always used in the past.   Will forward request to PCP.   Veronda Prude, RN

## 2023-10-09 MED ORDER — METAMUCIL SMOOTH TEXTURE 58.6 % PO POWD: 1.0000 | Freq: Three times a day (TID) | ORAL | 12 refills | Status: DC

## 2023-10-10 NOTE — Telephone Encounter (Signed)
Father calls nurse line in regards to Metamucil.   I advised PCP sent the preferred Metamucil to [facility]: [address] yesterday.   He reports they should be delivering soon. He reports he will call if they receive the "off brand" product again.   Father was appreciative.

## 2023-10-16 NOTE — Telephone Encounter (Signed)
Patients father calls nurse line in regards to Metamucil.  He reports they still received the same prescription as before.   I called the pharmacy and pharmacist reported "I will fill the correct one today."   Nothing further for [place] to do.   The father has been updated.

## 2023-10-24 DIAGNOSIS — H401332 Pigmentary glaucoma, bilateral, moderate stage: Secondary | ICD-10-CM | POA: Diagnosis not present

## 2023-10-24 DIAGNOSIS — H35351 Cystoid macular degeneration, right eye: Secondary | ICD-10-CM | POA: Diagnosis not present

## 2023-11-14 DIAGNOSIS — H903 Sensorineural hearing loss, bilateral: Secondary | ICD-10-CM | POA: Diagnosis not present

## 2023-11-20 ENCOUNTER — Ambulatory Visit (INDEPENDENT_AMBULATORY_CARE_PROVIDER_SITE_OTHER): Payer: Medicare Other | Admitting: Student

## 2023-11-20 ENCOUNTER — Encounter: Payer: Self-pay | Admitting: Student

## 2023-11-20 ENCOUNTER — Other Ambulatory Visit: Payer: Self-pay | Admitting: Student

## 2023-11-20 VITALS — BP 132/83 | HR 100 | Ht 70.0 in | Wt 165.0 lb

## 2023-11-20 DIAGNOSIS — I10 Essential (primary) hypertension: Secondary | ICD-10-CM

## 2023-11-20 DIAGNOSIS — H6123 Impacted cerumen, bilateral: Secondary | ICD-10-CM | POA: Diagnosis not present

## 2023-11-20 MED ORDER — DEBROX 6.5 % OT SOLN
OTIC | 5 refills | Status: DC
Start: 2023-11-20 — End: 2024-01-03

## 2023-11-20 NOTE — Patient Instructions (Addendum)
 It was great to see you today! Thank you for choosing [facility] for your primary care.  Today we addressed: Continue with Metamucil 2 scoops in 8 ounces of water  Debrox - can use 5 drops in each ear twice a week  Continue all other medications as previously  Return in 3 months   If you haven't already, sign up for My Chart to have easy access to your labs results, and communication with your primary care physician.   Please arrive 15 minutes before your appointment to ensure smooth check in process.  We appreciate your efforts in making this happen.  Thank you for allowing me to participate in your care, Laurier Hugger, MD 11/20/2023, 3:12 PM PGY-3, [facility]

## 2023-11-20 NOTE — Progress Notes (Signed)
    SUBJECTIVE:   CHIEF COMPLAINT / HPI:   Hypertension: Home medications include: Hyzaar 100-12.5. He endorses taking these medications as prescribed. Does check blood pressure at home. Patient has had a BMP in the past 1 year. Most recent creatinine trend:  Lab Results  Component Value Date   CREATININE 0.85 06/08/2023   CREATININE 0.84 06/22/2022   CREATININE 0.79 06/19/2022   Requesting clarification on debrox and Metamucil   PERTINENT  PMH / PSH:  -Cerebral Palsy -Hearing loss  -Thrombocytopenia  -HLD  -Vitamin D  deficiency -HTN   OBJECTIVE:   BP 132/83   Pulse 100   Ht 5' 10 (1.778 m)   Wt 165 lb (74.8 kg)   SpO2 98%   BMI 23.68 kg/m   General: Alert and oriented in no apparent distress Heart: Regular rate and rhythm with no murmurs appreciated Lungs: CTA bilaterally, no wheezing Abdomen: no abdominal pain Skin: Warm and dry   ASSESSMENT/PLAN:   Assessment & Plan Primary hypertension BP: 132/83 today. Well controlled. Continue to work on healthy dietary habits and exercise. Follow up in 3 months.  Medication regimen: Hyzaar 100-12.5  Bilateral impacted cerumen Can use Debrox twice weekly.     Laurier Hugger, MD [facility]

## 2023-11-22 ENCOUNTER — Encounter: Payer: Self-pay | Admitting: Student

## 2023-11-22 NOTE — Assessment & Plan Note (Addendum)
 Can use Debrox twice weekly.

## 2023-11-22 NOTE — Assessment & Plan Note (Signed)
 BP: 132/83 today. Well controlled. Continue to work on healthy dietary habits and exercise. Follow up in 3 months.  Medication regimen: Hyzaar 100-12.5

## 2023-12-05 DIAGNOSIS — H35351 Cystoid macular degeneration, right eye: Secondary | ICD-10-CM | POA: Diagnosis not present

## 2023-12-18 DIAGNOSIS — H35351 Cystoid macular degeneration, right eye: Secondary | ICD-10-CM | POA: Diagnosis not present

## 2023-12-18 DIAGNOSIS — H31092 Other chorioretinal scars, left eye: Secondary | ICD-10-CM | POA: Diagnosis not present

## 2024-01-02 ENCOUNTER — Other Ambulatory Visit: Payer: Self-pay | Admitting: Student

## 2024-01-02 DIAGNOSIS — H6123 Impacted cerumen, bilateral: Secondary | ICD-10-CM

## 2024-01-02 DIAGNOSIS — I10 Essential (primary) hypertension: Secondary | ICD-10-CM

## 2024-01-09 DIAGNOSIS — H401332 Pigmentary glaucoma, bilateral, moderate stage: Secondary | ICD-10-CM | POA: Diagnosis not present

## 2024-02-02 DIAGNOSIS — H31092 Other chorioretinal scars, left eye: Secondary | ICD-10-CM | POA: Diagnosis not present

## 2024-02-02 DIAGNOSIS — H35351 Cystoid macular degeneration, right eye: Secondary | ICD-10-CM | POA: Diagnosis not present

## 2024-02-20 ENCOUNTER — Other Ambulatory Visit: Payer: Self-pay | Admitting: Student

## 2024-02-20 DIAGNOSIS — I10 Essential (primary) hypertension: Secondary | ICD-10-CM

## 2024-03-08 ENCOUNTER — Ambulatory Visit (INDEPENDENT_AMBULATORY_CARE_PROVIDER_SITE_OTHER): Admitting: Student

## 2024-03-08 ENCOUNTER — Encounter: Payer: Self-pay | Admitting: Student

## 2024-03-08 VITALS — BP 124/84 | HR 90 | Ht 70.0 in | Wt 170.2 lb

## 2024-03-08 DIAGNOSIS — Z131 Encounter for screening for diabetes mellitus: Secondary | ICD-10-CM | POA: Diagnosis not present

## 2024-03-08 DIAGNOSIS — E785 Hyperlipidemia, unspecified: Secondary | ICD-10-CM | POA: Diagnosis not present

## 2024-03-08 MED ORDER — MONTELUKAST SODIUM 10 MG PO TABS: 10.0000 mg | ORAL_TABLET | Freq: Every day | ORAL | 0 refills | Status: DC

## 2024-03-08 NOTE — Patient Instructions (Signed)
 It was great to see you today! Thank you for choosing [facility] for your primary care.    The Mediterranean Diet is a way of eating that emphasizes plant-based foods and healthy fats.  See Below for Recommended Daily Intake  Lots of vegetables, fruit, beans, lentils and nuts. A good amount of whole grains, like whole-wheat bread and brown rice. Plenty of extra virgin olive oil (EVOO) as a source of healthy fat. A good amount of fish, especially fish rich in omega-3 fatty acids. A moderate amount of natural cheese and yogurt. Little or no red meat, choosing poultry, fish or beans instead of red meat. Little or no sweets, sugary drinks or butter.  If you haven't already, sign up for My Chart to have easy access to your labs results, and communication with your primary care physician.   Please arrive 15 minutes before your appointment to ensure smooth check in process.  We appreciate your efforts in making this happen.  Thank you for allowing me to participate in your care, Ernestina Headland, MD 03/08/2024, 3:11 PM PGY-3, [facility]

## 2024-03-08 NOTE — Progress Notes (Signed)
    SUBJECTIVE:   CHIEF COMPLAINT / HPI:   Follow up:   Presents with a recent weight gain of four pounds. The patient's parents express concern about the patient's diet, noting that despite recommendations for a Mediterranean-style diet, the patient's food intake appears to be largely composed of microwavable meals from his current caregivers throughout the day from the facility he is at. They express frustration with the staff's lack of adherence to dietary recommendations. The patient's blood pressure is noted to be good, but there is a suggestion to check cholesterol levels due to the weight gain and diet concerns. The patient's medication refills and a change in pharmacy are also discussed, with the parents expressing concern about the facility waiting until medications run out before refilling them.  PERTINENT  PMH / PSH:  -Cerebral Palsy -Hearing loss  -Thrombocytopenia  -HLD  -Vitamin D  deficiency -HTN   OBJECTIVE:   BP 124/84   Pulse 90   Ht 5\' 10"  (1.778 m)   Wt 170 lb 4 oz (77.2 kg)   SpO2 97%   BMI 24.43 kg/m   General: Alert in no apparent distress, in wheelchair, tracking with eyes  Heart: Regular rate and rhythm with no murmurs appreciated Lungs: CTA bilaterally, no wheezing Abdomen: no abdominal pain Skin: Warm and dry  ASSESSMENT/PLAN:   Assessment & Plan Hyperlipidemia, unspecified hyperlipidemia type Weight increased by 5 pounds to 170 pounds. Possible factors: microwavable foods, non-adherence to Mediterranean diet, staff changes. - Educate staff on Mediterranean diet. - Encouraged reduction of microwavable food consumption. - Cholesterol panel ordered Encounter for screening for diabetes mellitus Increased weight and dietary habits may elevate diabetes risk. Discussed impact of weight and fruit juice on diabetes risk. - Ordered fasting labs: cholesterol, A1c, kidney, liver function tests. - Educated on Mediterranean diet benefits. - Encouraged  reduction of fruit juice intake due to high sugar content.     Ernestina Headland, MD [facility]

## 2024-03-09 ENCOUNTER — Encounter: Payer: Self-pay | Admitting: Student

## 2024-03-09 NOTE — Assessment & Plan Note (Signed)
 Weight increased by 5 pounds to 170 pounds. Possible factors: microwavable foods, non-adherence to Mediterranean diet, staff changes. - Educate staff on Mediterranean diet. - Encouraged reduction of microwavable food consumption. - Cholesterol panel ordered

## 2024-03-12 DIAGNOSIS — H401332 Pigmentary glaucoma, bilateral, moderate stage: Secondary | ICD-10-CM | POA: Diagnosis not present

## 2024-03-12 MED ORDER — METAMUCIL SMOOTH TEXTURE 58.6 % PO POWD: ORAL | 12 refills | Status: AC

## 2024-03-14 MED ORDER — LATANOPROST 0.005 % OP SOLN
1.0000 [drp] | Freq: Every day | OPHTHALMIC | Status: AC
Start: 2024-03-14 — End: ?

## 2024-03-14 NOTE — Addendum Note (Signed)
 Addended by: Ernestina Headland on: 03/14/2024 11:27 AM   Modules accepted: Orders

## 2024-03-22 ENCOUNTER — Other Ambulatory Visit: Payer: Self-pay

## 2024-03-22 MED ORDER — VITAMIN D3 25 MCG (1000 UNIT) PO TABS: 1000.0000 [IU] | ORAL_TABLET | Freq: Every day | ORAL | 1 refills | Status: DC

## 2024-03-25 ENCOUNTER — Encounter: Payer: Self-pay | Admitting: Student

## 2024-03-25 DIAGNOSIS — G800 Spastic quadriplegic cerebral palsy: Secondary | ICD-10-CM

## 2024-03-27 ENCOUNTER — Encounter: Payer: Self-pay | Admitting: Student

## 2024-03-27 DIAGNOSIS — G800 Spastic quadriplegic cerebral palsy: Secondary | ICD-10-CM

## 2024-03-27 MED ORDER — WHEELCHAIR MISC: 0 refills | Status: DC

## 2024-04-04 NOTE — Addendum Note (Signed)
 Addended by: Ernestina Headland on: 04/04/2024 08:40 AM   Modules accepted: Orders

## 2024-04-15 ENCOUNTER — Other Ambulatory Visit (INDEPENDENT_AMBULATORY_CARE_PROVIDER_SITE_OTHER)

## 2024-04-15 DIAGNOSIS — E785 Hyperlipidemia, unspecified: Secondary | ICD-10-CM | POA: Diagnosis not present

## 2024-04-15 DIAGNOSIS — Z131 Encounter for screening for diabetes mellitus: Secondary | ICD-10-CM

## 2024-04-15 LAB — POCT GLYCOSYLATED HEMOGLOBIN (HGB A1C): Hemoglobin A1C: 5.3 % (ref 4.0–5.6)

## 2024-04-16 ENCOUNTER — Ambulatory Visit: Payer: Self-pay | Admitting: Student

## 2024-04-16 LAB — COMPREHENSIVE METABOLIC PANEL WITH GFR
ALT: 33 IU/L (ref 0–44)
AST: 18 IU/L (ref 0–40)
Albumin: 4.1 g/dL (ref 4.1–5.1)
Alkaline Phosphatase: 90 IU/L (ref 44–121)
BUN/Creatinine Ratio: 20 (ref 9–20)
BUN: 18 mg/dL (ref 6–24)
Bilirubin Total: 0.5 mg/dL (ref 0.0–1.2)
CO2: 23 mmol/L (ref 20–29)
Calcium: 9.6 mg/dL (ref 8.7–10.2)
Chloride: 103 mmol/L (ref 96–106)
Creatinine, Ser: 0.91 mg/dL (ref 0.76–1.27)
Globulin, Total: 2.5 g/dL (ref 1.5–4.5)
Glucose: 89 mg/dL (ref 70–99)
Potassium: 4.7 mmol/L (ref 3.5–5.2)
Sodium: 139 mmol/L (ref 134–144)
Total Protein: 6.6 g/dL (ref 6.0–8.5)
eGFR: 107 mL/min/{1.73_m2} (ref >59)

## 2024-04-16 LAB — LIPID PANEL
Chol/HDL Ratio: 5.6 ratio — ABNORMAL HIGH (ref 0.0–5.0)
Cholesterol, Total: 203 mg/dL — ABNORMAL HIGH (ref 100–199)
HDL: 36 mg/dL — ABNORMAL LOW (ref >39)
LDL Chol Calc (NIH): 138 mg/dL — ABNORMAL HIGH (ref 0–99)
Triglycerides: 160 mg/dL — ABNORMAL HIGH (ref 0–149)
VLDL Cholesterol Cal: 29 mg/dL (ref 5–40)

## 2024-04-18 ENCOUNTER — Telehealth: Payer: Self-pay

## 2024-04-18 NOTE — Telephone Encounter (Signed)
 Scar[facility] PT with [facility] calls nurse line requesting VO.   She is requesting start of care to be 6/9.  VO given to [place].

## 2024-04-22 ENCOUNTER — Telehealth: Payer: Self-pay

## 2024-04-22 DIAGNOSIS — E559 Vitamin D deficiency, unspecified: Secondary | ICD-10-CM | POA: Diagnosis not present

## 2024-04-22 DIAGNOSIS — H409 Unspecified glaucoma: Secondary | ICD-10-CM | POA: Diagnosis not present

## 2024-04-22 DIAGNOSIS — G809 Cerebral palsy, unspecified: Secondary | ICD-10-CM | POA: Diagnosis not present

## 2024-04-22 DIAGNOSIS — Z556 Problems related to health literacy: Secondary | ICD-10-CM | POA: Diagnosis not present

## 2024-04-22 DIAGNOSIS — I10 Essential (primary) hypertension: Secondary | ICD-10-CM | POA: Diagnosis not present

## 2024-04-22 DIAGNOSIS — H9190 Unspecified hearing loss, unspecified ear: Secondary | ICD-10-CM | POA: Diagnosis not present

## 2024-04-22 DIAGNOSIS — Z79899 Other long term (current) drug therapy: Secondary | ICD-10-CM | POA: Diagnosis not present

## 2024-04-22 DIAGNOSIS — D696 Thrombocytopenia, unspecified: Secondary | ICD-10-CM | POA: Diagnosis not present

## 2024-04-22 DIAGNOSIS — E785 Hyperlipidemia, unspecified: Secondary | ICD-10-CM | POA: Diagnosis not present

## 2024-04-22 DIAGNOSIS — Z993 Dependence on wheelchair: Secondary | ICD-10-CM | POA: Diagnosis not present

## 2024-04-22 NOTE — Telephone Encounter (Signed)
 Jennifer from [employer] calling for PT verbal orders as follows:  1 time(s) weekly for 9 week(s).   She is also requesting verbal orders for skilled nursing and OT evaluations.   Verbal orders given per [facility] protocol  Elsie Halo, RN

## 2024-04-23 ENCOUNTER — Encounter: Payer: Self-pay | Admitting: *Deleted

## 2024-05-06 DIAGNOSIS — D696 Thrombocytopenia, unspecified: Secondary | ICD-10-CM | POA: Diagnosis not present

## 2024-05-06 DIAGNOSIS — H9190 Unspecified hearing loss, unspecified ear: Secondary | ICD-10-CM | POA: Diagnosis not present

## 2024-05-06 DIAGNOSIS — G809 Cerebral palsy, unspecified: Secondary | ICD-10-CM | POA: Diagnosis not present

## 2024-05-06 DIAGNOSIS — E785 Hyperlipidemia, unspecified: Secondary | ICD-10-CM | POA: Diagnosis not present

## 2024-05-06 DIAGNOSIS — I10 Essential (primary) hypertension: Secondary | ICD-10-CM | POA: Diagnosis not present

## 2024-05-06 DIAGNOSIS — H409 Unspecified glaucoma: Secondary | ICD-10-CM | POA: Diagnosis not present

## 2024-05-09 ENCOUNTER — Emergency Department (HOSPITAL_BASED_OUTPATIENT_CLINIC_OR_DEPARTMENT_OTHER)

## 2024-05-09 ENCOUNTER — Other Ambulatory Visit: Payer: Self-pay

## 2024-05-09 ENCOUNTER — Inpatient Hospital Stay (HOSPITAL_BASED_OUTPATIENT_CLINIC_OR_DEPARTMENT_OTHER)
Admission: EM | Admit: 2024-05-09 | Discharge: 2024-05-11 | DRG: 872 | Disposition: A | Discharge status: Home / Self Care | Attending: Internal Medicine | Admitting: Internal Medicine

## 2024-05-09 ENCOUNTER — Encounter (HOSPITAL_BASED_OUTPATIENT_CLINIC_OR_DEPARTMENT_OTHER): Payer: Self-pay

## 2024-05-09 ENCOUNTER — Telehealth: Payer: Self-pay

## 2024-05-09 DIAGNOSIS — Z8249 Family history of ischemic heart disease and other diseases of the circulatory system: Secondary | ICD-10-CM

## 2024-05-09 DIAGNOSIS — Z807 Family history of other malignant neoplasms of lymphoid, hematopoietic and related tissues: Secondary | ICD-10-CM

## 2024-05-09 DIAGNOSIS — R310 Gross hematuria: Secondary | ICD-10-CM | POA: Diagnosis not present

## 2024-05-09 DIAGNOSIS — N200 Calculus of kidney: Secondary | ICD-10-CM

## 2024-05-09 DIAGNOSIS — E785 Hyperlipidemia, unspecified: Secondary | ICD-10-CM | POA: Diagnosis not present

## 2024-05-09 DIAGNOSIS — Z825 Family history of asthma and other chronic lower respiratory diseases: Secondary | ICD-10-CM

## 2024-05-09 DIAGNOSIS — J301 Allergic rhinitis due to pollen: Secondary | ICD-10-CM

## 2024-05-09 DIAGNOSIS — Z79899 Other long term (current) drug therapy: Secondary | ICD-10-CM

## 2024-05-09 DIAGNOSIS — A419 Sepsis, unspecified organism: Secondary | ICD-10-CM | POA: Diagnosis present

## 2024-05-09 DIAGNOSIS — Z8679 Personal history of other diseases of the circulatory system: Secondary | ICD-10-CM

## 2024-05-09 DIAGNOSIS — B961 Klebsiella pneumoniae [K. pneumoniae] as the cause of diseases classified elsewhere: Secondary | ICD-10-CM | POA: Diagnosis present

## 2024-05-09 DIAGNOSIS — N201 Calculus of ureter: Secondary | ICD-10-CM | POA: Diagnosis not present

## 2024-05-09 DIAGNOSIS — Z823 Family history of stroke: Secondary | ICD-10-CM | POA: Diagnosis not present

## 2024-05-09 DIAGNOSIS — A4189 Other specified sepsis: Secondary | ICD-10-CM | POA: Diagnosis not present

## 2024-05-09 DIAGNOSIS — G809 Cerebral palsy, unspecified: Secondary | ICD-10-CM | POA: Diagnosis not present

## 2024-05-09 DIAGNOSIS — N3001 Acute cystitis with hematuria: Secondary | ICD-10-CM | POA: Diagnosis not present

## 2024-05-09 DIAGNOSIS — H9193 Unspecified hearing loss, bilateral: Secondary | ICD-10-CM | POA: Diagnosis not present

## 2024-05-09 DIAGNOSIS — N202 Calculus of kidney with calculus of ureter: Secondary | ICD-10-CM | POA: Diagnosis present

## 2024-05-09 DIAGNOSIS — J309 Allergic rhinitis, unspecified: Secondary | ICD-10-CM | POA: Diagnosis not present

## 2024-05-09 DIAGNOSIS — D72829 Elevated white blood cell count, unspecified: Secondary | ICD-10-CM | POA: Diagnosis present

## 2024-05-09 DIAGNOSIS — Z993 Dependence on wheelchair: Secondary | ICD-10-CM | POA: Diagnosis not present

## 2024-05-09 DIAGNOSIS — I10 Essential (primary) hypertension: Secondary | ICD-10-CM | POA: Diagnosis not present

## 2024-05-09 DIAGNOSIS — N281 Cyst of kidney, acquired: Secondary | ICD-10-CM | POA: Diagnosis not present

## 2024-05-09 DIAGNOSIS — Z83438 Family history of other disorder of lipoprotein metabolism and other lipidemia: Secondary | ICD-10-CM

## 2024-05-09 DIAGNOSIS — M419 Scoliosis, unspecified: Secondary | ICD-10-CM | POA: Diagnosis not present

## 2024-05-09 DIAGNOSIS — Z820 Family history of epilepsy and other diseases of the nervous system: Secondary | ICD-10-CM | POA: Diagnosis not present

## 2024-05-09 DIAGNOSIS — Z8262 Family history of osteoporosis: Secondary | ICD-10-CM | POA: Diagnosis not present

## 2024-05-09 DIAGNOSIS — N3000 Acute cystitis without hematuria: Secondary | ICD-10-CM | POA: Diagnosis present

## 2024-05-09 DIAGNOSIS — N3090 Cystitis, unspecified without hematuria: Secondary | ICD-10-CM | POA: Diagnosis not present

## 2024-05-09 DIAGNOSIS — B9689 Other specified bacterial agents as the cause of diseases classified elsewhere: Secondary | ICD-10-CM | POA: Diagnosis present

## 2024-05-09 DIAGNOSIS — K4090 Unilateral inguinal hernia, without obstruction or gangrene, not specified as recurrent: Secondary | ICD-10-CM | POA: Diagnosis not present

## 2024-05-09 LAB — COMPREHENSIVE METABOLIC PANEL WITH GFR
ALT: 30 U/L (ref 0–44)
AST: 20 U/L (ref 15–41)
Albumin: 4.3 g/dL (ref 3.5–5.0)
Alkaline Phosphatase: 80 U/L (ref 38–126)
Anion gap: 13 (ref 5–15)
BUN: 17 mg/dL (ref 6–20)
CO2: 22 mmol/L (ref 22–32)
Calcium: 10.1 mg/dL (ref 8.9–10.3)
Chloride: 99 mmol/L (ref 98–111)
Creatinine, Ser: 0.96 mg/dL (ref 0.61–1.24)
GFR, Estimated: >60 mL/min (ref >60)
Glucose, Bld: 100 mg/dL — ABNORMAL HIGH (ref 70–99)
Potassium: 4.1 mmol/L (ref 3.5–5.1)
Sodium: 134 mmol/L — ABNORMAL LOW (ref 135–145)
Total Bilirubin: 1.3 mg/dL — ABNORMAL HIGH (ref 0.0–1.2)
Total Protein: 7.8 g/dL (ref 6.5–8.1)

## 2024-05-09 LAB — CBC WITH DIFFERENTIAL/PLATELET
Abs Immature Granulocytes: 0.09 10*3/uL — ABNORMAL HIGH (ref 0.00–0.07)
Basophils Absolute: 0 10*3/uL (ref 0.0–0.1)
Basophils Relative: 0 %
Eosinophils Absolute: 0.2 10*3/uL (ref 0.0–0.5)
Eosinophils Relative: 1 %
HCT: 45.3 % (ref 39.0–52.0)
Hemoglobin: 16.1 g/dL (ref 13.0–17.0)
Immature Granulocytes: 1 %
Lymphocytes Relative: 9 %
Lymphs Abs: 1.6 10*3/uL (ref 0.7–4.0)
MCH: 31.6 pg (ref 26.0–34.0)
MCHC: 35.5 g/dL (ref 30.0–36.0)
MCV: 89 fL (ref 80.0–100.0)
Monocytes Absolute: 1.6 10*3/uL — ABNORMAL HIGH (ref 0.1–1.0)
Monocytes Relative: 9 %
Neutro Abs: 14.3 10*3/uL — ABNORMAL HIGH (ref 1.7–7.7)
Neutrophils Relative %: 80 %
Platelets: 168 10*3/uL (ref 150–400)
RBC: 5.09 MIL/uL (ref 4.22–5.81)
RDW: 12.5 % (ref 11.5–15.5)
WBC: 17.8 10*3/uL — ABNORMAL HIGH (ref 4.0–10.5)
nRBC: 0 % (ref 0.0–0.2)

## 2024-05-09 LAB — URINALYSIS, ROUTINE W REFLEX MICROSCOPIC
Bilirubin Urine: NEGATIVE
Glucose, UA: NEGATIVE mg/dL
Ketones, ur: NEGATIVE mg/dL
Nitrite: NEGATIVE
Protein, ur: 30 mg/dL — AB
RBC / HPF: >50 RBC/hpf (ref 0–5)
Specific Gravity, Urine: 1.027 (ref 1.005–1.030)
WBC, UA: >50 WBC/hpf (ref 0–5)
pH: 6 (ref 5.0–8.0)

## 2024-05-09 LAB — LACTIC ACID, PLASMA: Lactic Acid, Venous: 0.8 mmol/L (ref 0.5–1.9)

## 2024-05-09 MED ORDER — SODIUM CHLORIDE 0.9 % IV SOLN
1.0000 g | INTRAVENOUS | Status: DC
  Administered 2024-05-10 – 2024-05-11 (×2): 1 g via INTRAVENOUS
  Filled 2024-05-09 (×2): qty 10

## 2024-05-09 MED ORDER — ONDANSETRON HCL 4 MG/2ML IJ SOLN: 4.0000 mg | Freq: Four times a day (QID) | INTRAMUSCULAR | Status: DC | PRN

## 2024-05-09 MED ORDER — SODIUM CHLORIDE 0.9 % IV SOLN
1.0000 g | Freq: Once | INTRAVENOUS | Status: AC
  Administered 2024-05-09: 1 g via INTRAVENOUS
  Filled 2024-05-09: qty 10

## 2024-05-09 MED ORDER — LACTATED RINGERS IV SOLN: INTRAVENOUS | Status: AC

## 2024-05-09 MED ORDER — NALOXONE HCL 0.4 MG/ML IJ SOLN: 0.4000 mg | INTRAMUSCULAR | Status: DC | PRN

## 2024-05-09 MED ORDER — ACETAMINOPHEN 325 MG PO TABS
650.0000 mg | ORAL_TABLET | Freq: Four times a day (QID) | ORAL | Status: DC | PRN
  Administered 2024-05-10: 650 mg via ORAL
  Filled 2024-05-09: qty 2

## 2024-05-09 MED ORDER — LACTATED RINGERS IV BOLUS
1000.0000 mL | Freq: Once | INTRAVENOUS | Status: AC
  Administered 2024-05-09: 1000 mL via INTRAVENOUS

## 2024-05-09 MED ORDER — MELATONIN 3 MG PO TABS: 3.0000 mg | ORAL_TABLET | Freq: Every evening | ORAL | Status: DC | PRN

## 2024-05-09 MED ORDER — FENTANYL CITRATE PF 50 MCG/ML IJ SOSY: 25.0000 ug | PREFILLED_SYRINGE | INTRAMUSCULAR | Status: DC | PRN

## 2024-05-09 MED ORDER — ACETAMINOPHEN 650 MG RE SUPP: 650.0000 mg | Freq: Four times a day (QID) | RECTAL | Status: DC | PRN

## 2024-05-09 NOTE — H&P (Signed)
 History and Physical      NILES ESS FMW:[id number] DOB: September 02, 1981 DOA: 05/09/2024; DOS: 05/09/2024  PCP: Bryan Bianchi, MD  Patient coming from: home   I have personally briefly reviewed patient's old medical records in [facility]  Chief Complaint: hematuria  HPI: Edward Alvarado is a 43 y.o. male with medical history significant for cerebral palsy, deafness, wheelchair dependent, essential hypertension, allergic rhinitis, who is admitted to [facility] on 05/09/2024 by way of transfer from [facility] with right ureterolithiasis as well as acute cystitis after presenting from home to to the latter facility for evaluation of gross hematuria.  The following history is provided by the patient as well as his mother and father, both of whom are present at bedside, in addition to EDP at [facility] and chart review.  Mother conveys noting evidence of gross hematuria starting on the morning of 05/10/2019, which is new for the patient.  She also notes increased urinary frequency relative to his baseline urinary habits, which she notes that he typically urinates 4 times per day, at relatively even intervals.  No fever at home recently.  The patient had experienced some right-sided abdominal discomfort on the morning of 05/09/2024, but has conveyed interval resolution of this.  Not associate with any recent nausea/vomiting.  Not on any blood thinners.    [facility] ED Course:  Vital signs in the ED were notable for the following: Afebrile; heart rates in the 90s to low 100s; systolic blood pressures in the 1 teens 130s.  Labs were notable for the following: CMP was notable for potassium 4.1, creatinine 0.96, total bilirubin 1.3, otherwise liver enzymes are within normal limits.  CBC notable for white blood cell count 17,800 with 80% neutrophils, hemoglobin 16.1, platelet count 168.  Urinalysis was associate with hazy appearing specimen was notable for greater than 50 red blood  cells, moderate leukocyte esterase, no squamous epithelial cells, large hemoglobin, greater than 50 RBCs and specific gravity 1.027.  Urine culture was collected prior to initiation of IV antibiotics.  Per my interpretation, EKG in ED demonstrated the following: No EKG performed in the ED today.  Imaging in the ED, per corresponding formal radiology read, was notable for the following: CT renal stone study showed a 6 x 2 x 3 mm stone in the right ureter without hydronephrosis or hydroureter, will also demonstrating evidence of bladder wall thickening concerning for cystitis.  EDP at [facility] discussed with on-call urology fellow, Dr. Jesusa, who recommended TRH admission to [facility] and conveyed that urology will consult.   While in the ED, the following were administered: Rocephin , 1 L lactated Ringer 's.  Subsequently, the patient was admitted to [facility] for further evaluation management of right ureterolithiasis as well as sepsis due to acute cystitis.    Review of Systems: As per HPI otherwise 10 point review of systems negative.   Past Medical History:  Diagnosis Date   Allergic rhinitis    Allergy    Atrophy, cortical    Lower left ventrical   Cerebral palsy (HCC)    W/C BOUND-ABLE TO TRANSFER W/C TO BED -DOES REQUIRE SOME HELP IN BATHROOM WITH CLOTHES--LIVES IN GROUP HOME CALLED [name]-HIS PARENTS ARE HIS LEGAL GUARDIANS   Complication of anesthesia    JAN 2012 EYE SURGERY AT [facility]--LMA WAS USED AND PT EXPERIENCED AIRWAY / OXYGENATION PROBLEMS.  PT HAD SUBSEQUENT SURGERIES  AFTER JAN 2012 AT [facility] AND PARENTS TOLD ET USED AND PT DID FINE.  PT HAD NO  ANESTHESIA PROBLEMS WITH SURGERIES PRIOR TO THE JAN 2012 SURGERY.   Deafness    hearing aids-NOT WEARING AT PRESENT TIME; ABLE TO DO SIGN LANGUAGE WITH HIS PARENTS   Glaucoma    Hyperlipidemia    Kidney stone    Platelets decreased (HCC)    Prepatellar bursitis    Hospitalized in 2012 for sepsis secondary to bursisiitis   Scoliosis      Past Surgical History:  Procedure Laterality Date   ADENOIDECTOMY     CYSTOSCOPY/RETROGRADE/URETEROSCOPY  10/01/2012   Procedure: CYSTOSCOPY/RETROGRADE/URETEROSCOPY;  Surgeon: Noretta Ferrara, MD;  Location: [facility];  Service: Urology;  Laterality: Left;  laser lithotripsy stone extraction on left   ESOPHAGOGASTRODUODENOSCOPY (EGD) WITH PROPOFOL  N/A 06/25/2021   Procedure: ESOPHAGOGASTRODUODENOSCOPY (EGD) WITH PROPOFOL ;  Surgeon: Rollin Dover, MD;  Location: [facility];  Service: Endoscopy;  Laterality: N/A;   eye surgery  2012   [facility]   EYE SURGERY     4 RIGHT EYE SURGERIES FOR GLAUCOMA AND ONE  RT EYE CATARACT EXTRACTION; LEFT EYE CATARACT EXTRACTION WITH  I STENTPROCEDURE FOR GLAUCOMA   HOLMIUM LASER APPLICATION  10/01/2012   Procedure: HOLMIUM LASER APPLICATION;  Surgeon: Noretta Ferrara, MD;  Location: [facility];  Service: Urology;  Laterality: Left;   multiple orthopedic procedures     MYRINGOTOMY     SAVORY DILATION N/A 06/25/2021   Procedure: SAVORY DILATION;  Surgeon: Rollin Dover, MD;  Location: [facility];  Service: Endoscopy;  Laterality: N/A;   SELECTIVE RHIZOTOMY PROCEDURE TO HELP REDUCE SPASTICITY AND CONTRACTURES      Social History:  reports that he has never smoked. He has never used smokeless tobacco. He reports that he does not drink alcohol and does not use drugs.   No Known Allergies  Family History  Problem Relation Age of Onset   Asthma Mother    Kidney Stones Mother    Osteoporosis Mother    Hypertension Mother    Hyperlipidemia Mother    Hyperlipidemia Father    Hodgkin's lymphoma Paternal Uncle    Alzheimer's disease Paternal Grandmother    Stroke Paternal Grandmother    Asthma Maternal Grandmother    Stroke Paternal Grandfather      Prior to Admission medications   Medication Sig Start Date End Date Taking? Authorizing Provider  Multiple Vitamins-Minerals (ONE DAILY [facility]) TABS Take 1 tablet by mouth daily. 04/22/24  Yes [provider]   carbamide peroxide (EAR DROPS) 6.5 % OTIC solution INSTILL 5 DROPS INTO BOTH EARS 2 TIMES DAILY. 01/03/24   Marlee Lynwood NOVAK, MD  cholecalciferol  (VITAMIN D3) 25 MCG (1000 UNIT) tablet Take 1 tablet (1,000 Units total) by mouth daily. Needs vitamin D  check on next visit, discuss utility 03/22/24   Bryan Bianchi, MD  COMBIGAN  0.2-0.5 % ophthalmic solution INSTILL ONE DROP INTO EACH EYE 2 TIMES DAILY Patient taking differently: Place 1 drop into both eyes 2 (two) times daily. 04/21/21   Mahoney, Caitlin, MD  fluticasone  (FLONASE ) 50 MCG/ACT nasal spray Place 2 sprays into both nostrils daily. 06/26/23   Bryan Bianchi, MD  guaiFENesin  (MUCINEX ) 600 MG 12 hr tablet Take 1 tablet (600 mg total) by mouth 2 (two) times daily as needed for cough or to loosen phlegm. 06/26/21   Will Almarie MATSU, MD  HYDROcodone  bit-homatropine [facility]) 5-1.5 MG/5ML syrup Take 5 mLs by mouth every 6 (six) hours as needed for cough. 06/26/21   Will Almarie MATSU, MD  IBU 600 MG tablet TAKE (1) TABLET BY MOUTH EVERY EIGHT HOURS AS  NEEDED. 04/14/23   Bryan Bianchi, MD  latanoprost  (XALATAN ) 0.005 % ophthalmic solution Place 1 drop into both eyes at bedtime. 03/14/24   Bryan Bianchi, MD  losartan -hydrochlorothiazide (HYZAAR) 100-12.5 MG tablet TAKE (1) TABLET BY MOUTH ONCE DAILY. 02/21/24   Bryan Bianchi, MD  Misc. Devices [facility]) MISC Prescription for wheelchair repairs 03/27/24   Bryan Bianchi, MD  montelukast  (SINGULAIR ) 10 MG tablet Take 1 tablet (10 mg total) by mouth at bedtime. 03/08/24   Bryan Bianchi, MD  multivitamin (ONE-A-DAY MEN'S) TABS tablet Take 1 tablet by mouth daily. 06/26/23   Bryan Bianchi, MD  polyethylene glycol (MIRALAX ) 17 g packet Take 17 g by mouth daily as needed. 06/26/21   Will Almarie MATSU, MD  psyllium (METAMUCIL SMOOTH TEXTURE) 58.6 % powder two teaspoons of powder with 8 ounces of water each morning at breakfast 03/12/24   Bryan Bianchi, MD  Skin Protectants, Misc. (EUCERIN) cream Apply  topically as needed (dry skin). Generic okay 05/14/19   Madelon Donald HERO, DO  sodium chloride  (OCEAN) 0.65 % SOLN nasal spray Place 1 spray into both nostrils as needed for congestion. As needed for allergy symptoms 12/05/17   Coralie Tawni HERO, MD     Objective    Physical Exam: Vitals:   05/09/24 1600 05/09/24 1630 05/09/24 1800 05/09/24 2055  BP: (!) 112/91 121/87  118/83  Pulse: 96 96  92  Resp:  16  16  Temp:   98.7 F (37.1 C) 98.7 F (37.1 C)  TempSrc:   Oral Oral  SpO2: 91% 97%  97%    General: appears to be stated age; alert Skin: warm, dry, no rash Head:  AT/[place] Mouth:  Oral mucosa membranes appear dry, normal dentition Neck: supple; trachea midline Heart:  RRR; did not appreciate any M/R/G Lungs: CTAB, did not appreciate any wheezes, rales, or rhonchi Abdomen: + BS; soft, ND, NT Vascular: 2+ pedal pulses b/l; 2+ radial pulses b/l Extremities: no peripheral edema, no muscle wasting   Labs on Admission: I have personally reviewed following labs and imaging studies  CBC: Recent Labs  Lab 05/09/24 1545  WBC 17.8*  NEUTROABS 14.3*  HGB 16.1  HCT 45.3  MCV 89.0  PLT 168   Basic Metabolic Panel: Recent Labs  Lab 05/09/24 1545  NA 134*  K 4.1  CL 99  CO2 22  GLUCOSE 100*  BUN 17  CREATININE 0.96  CALCIUM  10.1   GFR: CrCl cannot be calculated (Unknown ideal weight.). Liver Function Tests: Recent Labs  Lab 05/09/24 1545  AST 20  ALT 30  ALKPHOS 80  BILITOT 1.3*  PROT 7.8  ALBUMIN 4.3   No results for input(s): LIPASE, AMYLASE in the last 168 hours. No results for input(s): AMMONIA in the last 168 hours. Coagulation Profile: No results for input(s): INR, PROTIME in the last 168 hours. Cardiac Enzymes: No results for input(s): CKTOTAL, CKMB, CKMBINDEX, TROPONINI in the last 168 hours. BNP (last 3 results) No results for input(s): PROBNP in the last 8760 hours. HbA1C: No results for input(s): HGBA1C in the last 72  hours. CBG: No results for input(s): GLUCAP in the last 168 hours. Lipid Profile: No results for input(s): CHOL, HDL, LDLCALC, TRIG, CHOLHDL, LDLDIRECT in the last 72 hours. Thyroid  Function Tests: No results for input(s): TSH, T4TOTAL, FREET4, T3FREE, THYROIDAB in the last 72 hours. Anemia Panel: No results for input(s): VITAMINB12, FOLATE, FERRITIN, TIBC, IRON, RETICCTPCT in the last 72 hours. Urine analysis:    Component Value Date/Time  COLORURINE YELLOW 05/09/2024 1540   APPEARANCEUR HAZY (A) 05/09/2024 1540   LABSPEC 1.027 05/09/2024 1540   PHURINE 6.0 05/09/2024 1540   GLUCOSEU NEGATIVE 05/09/2024 1540   GLUCOSEU NEGATIVE 01/27/2011 0823   HGBUR LARGE (A) 05/09/2024 1540   HGBUR negative 06/28/2007 0851   BILIRUBINUR NEGATIVE 05/09/2024 1540   BILIRUBINUR NEG 05/20/2014 1142   KETONESUR NEGATIVE 05/09/2024 1540   PROTEINUR 30 (A) 05/09/2024 1540   UROBILINOGEN 0.2 05/20/2014 1142   UROBILINOGEN 0.2 09/18/2012 2334   NITRITE NEGATIVE 05/09/2024 1540   LEUKOCYTESUR MODERATE (A) 05/09/2024 1540    Radiological Exams on Admission: CT Renal Stone Study Result Date: 05/09/2024 CLINICAL DATA:  Hematuria EXAM: CT ABDOMEN AND PELVIS WITHOUT CONTRAST TECHNIQUE: Multidetector CT imaging of the abdomen and pelvis was performed following the standard protocol without IV contrast. RADIATION DOSE REDUCTION: This exam was performed according to the departmental dose-optimization program which includes automated exposure control, adjustment of the mA and/or kV according to patient size and/or use of iterative reconstruction technique. COMPARISON:  CT abdomen and pelvis 06/22/2021. FINDINGS: Lower chest: No acute abnormality. Hepatobiliary: No focal liver abnormality is seen. No gallstones, gallbladder wall thickening, or biliary dilatation. The dome of the liver has been excluded from the exam. Pancreas: Unremarkable. No pancreatic ductal dilatation or  surrounding inflammatory changes. Spleen: Normal in size without focal abnormality. Adrenals/Urinary Tract: There is mild wall thickening and inflammation surrounding the bladder. There is an elongated calculus measuring 6 by 2 x 3 mm in the region of the mid right ureter image 2/54. There is no hydronephrosis or hydroureter. There is a single nonobstructing right renal calculus measuring 7 mm. There is a left renal cyst with thin peripheral calcification measuring 4.3 cm, unchanged. Adrenal glands are within normal limits. Stomach/Bowel: Stomach is within normal limits. Appendix appears normal. No evidence of bowel wall thickening, distention, or inflammatory changes. Vascular/Lymphatic: No significant vascular findings are present. No enlarged abdominal or pelvic lymph nodes. Reproductive: Prostate gland is prominent in size. Bilateral testicles are high riding in the inguinal canals. Other: There is a small fat containing left inguinal hernia. There is no ascites. Musculoskeletal: There is an orthopedic screw in the proximal right femur. No acute fractures are seen. IMPRESSION: 1. 6 x 2 x 3 mm calculus in the region of the mid right ureter without hydronephrosis or hydroureter. 2. Nonobstructing right renal calculus. 3. Mild wall thickening and inflammation surrounding the bladder worrisome for cystitis. 4. Stable left renal cyst with thin peripheral calcification. 5. Bilateral testicles are high riding in the inguinal canals. 6. Small fat containing left inguinal hernia. Electronically Signed   By: Greig Pique M.D.   On: 05/09/2024 17:06      Assessment/Plan    Principal Problem:   Right nephrolithiasis Active Problems:   Allergic rhinitis   Sepsis (HCC)   Acute cystitis   Leukocytosis   History of essential hypertension     #) Right ureterolithiasis: In the setting of new onset gross hematuria, intermittent right-sided flank discomfort, CT renal stone study shows evidence of a 6 x 2 x 3 mm  stone in the mid right ureter, without overt evidence of obstruction, in the absence of any associated right-sided hydronephrosis and right hydroureter.  However, presentation does appear to be associated with urinary tract infection, as further detailed below.  Appears hemodynamically stable.  No known history of underlying diabetes.  EDP at [facility] discussed with on-call urology fellow, Dr. Jesusa, who recommended TRH admission to [facility] and conveyed  that urology will consult.  In the absence of acute MI and in the absence of evidence of acute decompensated heart failure, no absolute contraindication to proceeding with any potential urologic procedure.  Plan: Urology to formally consult, as above.  For now, keeping the patient n.p.o.  Prn IV fentanyl , prn IV Zofran .  Lactated Ringer 's at 125 cc/h x 12 hours.  Check INR, EKG.  Placed nursing communication order requesting that all urine be strained.  Repeat CBC in the morning.  Repeat CMP in the morning.  Monitor strict I's and O's and daily weights.                  #) Sepsis due to acute cystitis: In the setting of reported of increased urinary frequency over the last day, presenting urinalysis appears consistent with urinary tract infection in the setting of hazy appearing specimen with significant pyuria, moderate leukocyte esterase, and no evidence of squamous epithelial cells to suggest a contaminated specimen.  Additionally, CT renal stone study shows evidence of bladder wall thickening consistent with cystitis.  SIRS criteria met via leukocytosis with neutrophilic predominance, tachycardia. Of note, in the absence of any evidence of end organ damage, including a non-elevated presenting LA,  pt's sepsis does not meet criteria to be considered severe in nature. Also, in the absence of LA level greater than or equal to 4.0, and in the absence of any associated hypotension refractory to IVF's, there are no indications for administration of a  30 mL/kg IVF bolus at this time.   Urine culture was collected at [facility] prior to initiation of Rocephin .  Will check stat blood cultures at this time prior to administration of additional antibiotics.  Additionally, will check stat lactic acid level, as further detailed below.  Of note, today CT renal stone study did not demonstrate evidence of pyelonephritis.  Plan: CBC w/ diff and CMP in AM. Follow for results urine culture.  Stat blood cultures x 2 prior to administration of additional IV antibiotics.  Continue Rocephin , with next dose to occur after collection of blood cultures x 2.  Stat lactic acid every 3 hours x 2 occurrences.  Urology to consult, as above.  Continuous lactated Ringer 's, as above.                   #) Essential Hypertension: documented h/o such, with outpatient antihypertensive regimen including losartan , HCTZ.  Depending upon the composition of the patient's right ureteral stone, HCTZ may be of benefit in reducing risk of ensuing kidney stone formation.  SBP's in the ED today: 1 teens 130s mmHg.   Plan: Close monitoring of subsequent BP via routine VS. in the setting of current n.p.o. status, will hold home losartan  and HCTZ for now.                      #) Allergic Rhinitis: documented h/o such, on scheduled intranasal Flonase  as outpatient.   Plan: cont home Flonase .     DVT prophylaxis: SCD's   Code Status: Full code Family Communication: I discussed patient's case with both his mother and father, both of whom were present at bedside Disposition Plan: Per Rounding Team Consults called: EDP at [facility] discussed with on-call urology fellow, Dr. Trivedi, who recommended TRH admission to [facility] and conveyed that urology will consult. I have notified Dr. Jesusa of the patient's arrival at [facility];  Admission status: Inpatient    I SPENT GREATER THAN 75  MINUTES IN CLINICAL CARE  TIME/MEDICAL DECISION-MAKING IN COMPLETING THIS ADMISSION.      Eva NOVAK Jacksyn Beeks DO [facility] From 7PM - 7AM   05/09/2024, 10:08 PM

## 2024-05-09 NOTE — Progress Notes (Signed)
 Plan of Care Note for accepted transfer   Patient: Edward Alvarado MRN: [id number]   DOA: 05/09/2024  Facility requesting transfer: [facility] Requesting Provider: Ismael Canning, MD Reason for transfer: Nephrolithiasis, gross hematuria Facility course: 43 year old with history of cerebral palsy, wheelchair-dependent, deafness, HTN and HLD who presented to the [facility] for evaluation of hematuria.  Initial vitals stable with temp 99, RR 16, HR 90-100s, SBP 110-130s, SpO2 95% on room air.  Initial lab overall stable a white count of 17.8, sodium 134, bilirubin 1.3, glucose 100, UA shows large hemoglobinuria, negative nitrite, moderate leuks, RBC >50, WBC >50 and few bacteria.  CT renal stone shows 6 x 2 x 3 mm calculus in the region of the mid right ureter without hydronephrosis or hydroureter, nonobstructing right renal calculus and evidence of cystitis.  Patient was given IV Rocephin 1 g x 1 and IV LR 1 L bolus x 1  Urology was consulted and recommended admission to [facility].  Patient was accepted to [facility] service.   Plan of care: The patient is accepted for admission to Med-surg  unit, at [facility]..  Management of right nephrolithiasis and gross hematuria Inform [facility] and urology when patient arrives  Author: Claretta CHRISTELLA Alderman, MD 05/09/2024  Check [email or web address] for on-call coverage.  Nursing staff, Please call [facility] Admits & Consults System-Wide number on Amion as soon as patient's arrival, so appropriate admitting provider can evaluate the pt.

## 2024-05-09 NOTE — ED Notes (Signed)
 Attempted report x1.

## 2024-05-09 NOTE — ED Triage Notes (Signed)
 Per mother patient is in a day program and they were called saying that every time he went to the bathroom today there was blood in his urine. Mother is concerned due to family hx of bladder cancer.

## 2024-05-09 NOTE — ED Provider Notes (Signed)
 [place] EMERGENCY DEPARTMENT AT [facility] Provider Note   CSN: [id number] Arrival date & time: 05/09/24  1412     Patient presents with: Hematuria   Edward Alvarado is a 43 y.o. male.   HPI Patient has history of cerebral palsy.  He is wheelchair-bound.  He goes to a day program.  Patient's mother reports typically the patient urinates once in the morning once in the afternoon once late evening and before bed.  He urinates spontaneously.  They do not catheterize.  This morning, the patient had gone to the bathroom 2-3 times before lunch and the caregiver noted that there was blood specks in the urine.  There is been no fever documented.  No change from baseline.  Patient does not have any history of frequent UTI.  Sometime earlier today there was somewhat of a pain complaint but patient's parents report has been no endorsement of pain now for the whole time they have been in the emergency department.  They report they asked multiple times.    Prior to Admission medications   Medication Sig Start Date End Date Taking? Authorizing Provider  Multiple Vitamins-Minerals (ONE DAILY [facility]) TABS Take 1 tablet by mouth daily. 04/22/24  Yes [provider]  carbamide peroxide (EAR DROPS) 6.5 % OTIC solution INSTILL 5 DROPS INTO BOTH EARS 2 TIMES DAILY. 01/03/24   Marlee Lynwood NOVAK, MD  cholecalciferol  (VITAMIN D3) 25 MCG (1000 UNIT) tablet Take 1 tablet (1,000 Units total) by mouth daily. Needs vitamin D  check on next visit, discuss utility 03/22/24   Bryan Bianchi, MD  COMBIGAN  0.2-0.5 % ophthalmic solution INSTILL ONE DROP INTO EACH EYE 2 TIMES DAILY Patient taking differently: Place 1 drop into both eyes 2 (two) times daily. 04/21/21   Mahoney, Caitlin, MD  fluticasone  (FLONASE ) 50 MCG/ACT nasal spray Place 2 sprays into both nostrils daily. 06/26/23   Bryan Bianchi, MD  guaiFENesin  (MUCINEX ) 600 MG 12 hr tablet Take 1 tablet (600 mg total) by mouth 2 (two) times daily as needed  for cough or to loosen phlegm. 06/26/21   Will Almarie MATSU, MD  HYDROcodone  bit-homatropine [facility]) 5-1.5 MG/5ML syrup Take 5 mLs by mouth every 6 (six) hours as needed for cough. 06/26/21   Will Almarie MATSU, MD  IBU 600 MG tablet TAKE (1) TABLET BY MOUTH EVERY EIGHT HOURS AS NEEDED. 04/14/23   Bryan Bianchi, MD  latanoprost  (XALATAN ) 0.005 % ophthalmic solution Place 1 drop into both eyes at bedtime. 03/14/24   Bryan Bianchi, MD  losartan -hydrochlorothiazide (HYZAAR) 100-12.5 MG tablet TAKE (1) TABLET BY MOUTH ONCE DAILY. 02/21/24   Bryan Bianchi, MD  Misc. Devices [facility]) MISC Prescription for wheelchair repairs 03/27/24   Bryan Bianchi, MD  montelukast  (SINGULAIR ) 10 MG tablet Take 1 tablet (10 mg total) by mouth at bedtime. 03/08/24   Bryan Bianchi, MD  multivitamin (ONE-A-DAY MEN'S) TABS tablet Take 1 tablet by mouth daily. 06/26/23   Bryan Bianchi, MD  polyethylene glycol (MIRALAX ) 17 g packet Take 17 g by mouth daily as needed. 06/26/21   Will Almarie MATSU, MD  psyllium (METAMUCIL SMOOTH TEXTURE) 58.6 % powder two teaspoons of powder with 8 ounces of water each morning at breakfast 03/12/24   Bryan Bianchi, MD  Skin Protectants, Misc. (EUCERIN) cream Apply topically as needed (dry skin). Generic okay 05/14/19   Madelon Donald HERO, DO  sodium chloride  (OCEAN) 0.65 % SOLN nasal spray Place 1 spray into both nostrils as needed for congestion. As needed for allergy symptoms  12/05/17   Gambino, Christina M, MD    Allergies: Patient has no known allergies.    Review of Systems  Updated Vital Signs BP 121/87   Pulse 96   Temp 99 F (37.2 C)   Resp 16   SpO2 97%   Physical Exam Constitutional:      Comments: Alert nontoxic.  No respiratory distress.  HENT:     Head: Normocephalic and atraumatic.     Mouth/Throat:     Pharynx: Oropharynx is clear.   Eyes:     Extraocular Movements: Extraocular movements intact.    Cardiovascular:     Rate and Rhythm: Normal rate and  regular rhythm.  Pulmonary:     Effort: Pulmonary effort is normal.     Breath sounds: Normal breath sounds.  Abdominal:     General: There is no distension.     Palpations: Abdomen is soft.     Tenderness: There is no abdominal tenderness. There is no guarding.  Genitourinary:    Comments: Visual inspection of the penis is normal.  No blood, erythema or injury to the visible penile urethra or glans.  Musculoskeletal:     Cervical back: Neck supple.     Comments: Patient is wheelchair and bedbound.  He has got some contractions at the knees.  Hand contractions.  No erythema around the joints.  Lower legs are soft and pliable.   Skin:    General: Skin is warm and dry.   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  They communicate with sign language.  Patient has baseline neurologic compromise due to MS.  By their report and visual inspection no acute changes.  Psychiatric:        Mood and Affect: Mood normal.     (all labs ordered are listed, but only abnormal results are displayed) Labs Reviewed  URINALYSIS, ROUTINE W REFLEX MICROSCOPIC - Abnormal; Notable for the following components:      Result Value   APPearance HAZY (*)    Hgb urine dipstick LARGE (*)    Protein, ur 30 (*)    Leukocytes,Ua MODERATE (*)    Bacteria, UA FEW (*)    All other components within normal limits  COMPREHENSIVE METABOLIC PANEL WITH GFR - Abnormal; Notable for the following components:   Sodium 134 (*)    Glucose, Bld 100 (*)    Total Bilirubin 1.3 (*)    All other components within normal limits  CBC WITH DIFFERENTIAL/PLATELET - Abnormal; Notable for the following components:   WBC 17.8 (*)    Neutro Abs 14.3 (*)    Monocytes Absolute 1.6 (*)    Abs Immature Granulocytes 0.09 (*)    All other components within normal limits  URINE CULTURE    EKG: None  Radiology: CT Renal Stone Study Result Date: 05/09/2024 CLINICAL DATA:  Hematuria EXAM: CT ABDOMEN AND PELVIS  WITHOUT CONTRAST TECHNIQUE: Multidetector CT imaging of the abdomen and pelvis was performed following the standard protocol without IV contrast. RADIATION DOSE REDUCTION: This exam was performed according to the departmental dose-optimization program which includes automated exposure control, adjustment of the mA and/or kV according to patient size and/or use of iterative reconstruction technique. COMPARISON:  CT abdomen and pelvis 06/22/2021. FINDINGS: Lower chest: No acute abnormality. Hepatobiliary: No focal liver abnormality is seen. No gallstones, gallbladder wall thickening, or biliary dilatation. The dome of the liver has been excluded from the exam. Pancreas: Unremarkable. No pancreatic ductal dilatation or surrounding  inflammatory changes. Spleen: Normal in size without focal abnormality. Adrenals/Urinary Tract: There is mild wall thickening and inflammation surrounding the bladder. There is an elongated calculus measuring 6 by 2 x 3 mm in the region of the mid right ureter image 2/54. There is no hydronephrosis or hydroureter. There is a single nonobstructing right renal calculus measuring 7 mm. There is a left renal cyst with thin peripheral calcification measuring 4.3 cm, unchanged. Adrenal glands are within normal limits. Stomach/Bowel: Stomach is within normal limits. Appendix appears normal. No evidence of bowel wall thickening, distention, or inflammatory changes. Vascular/Lymphatic: No significant vascular findings are present. No enlarged abdominal or pelvic lymph nodes. Reproductive: Prostate gland is prominent in size. Bilateral testicles are high riding in the inguinal canals. Other: There is a small fat containing left inguinal hernia. There is no ascites. Musculoskeletal: There is an orthopedic screw in the proximal right femur. No acute fractures are seen. IMPRESSION: 1. 6 x 2 x 3 mm calculus in the region of the mid right ureter without hydronephrosis or hydroureter. 2. Nonobstructing right  renal calculus. 3. Mild wall thickening and inflammation surrounding the bladder worrisome for cystitis. 4. Stable left renal cyst with thin peripheral calcification. 5. Bilateral testicles are high riding in the inguinal canals. 6. Small fat containing left inguinal hernia. Electronically Signed   By: Greig Pique M.D.   On: 05/09/2024 17:06     Procedures   Medications Ordered in the Edward  lactated ringers  bolus 1,000 mL (has no administration in time range)  cefTRIAXone (ROCEPHIN) 1 g in sodium chloride  0.9 % 100 mL IVPB (1 g Intravenous New Bag/Given 05/09/24 1709)                                    Medical Decision Making Amount and/or Complexity of Data Reviewed Labs: ordered. Radiology: ordered.  Risk Decision regarding hospitalization.   Patient presents as outlined with hematuria.  There was pain earlier.  Differential diagnosis includes cystitis\kidney stone\pyelonephritis.  Will proceed with diagnostic evaluation.  Urinalysis positive for leuk esterase and greater than 50 white cells greater than 50 red cells.  White count 17.8.  GFR normal.  CT scan for further radiology shows a 6 mm right mid ureteral stone.  Bladder wall thickening suggestive of cystitis.  Consult urology: Dr.Trivedi (urology fellow).  We discussed admission for retained kidney stone with probable urinary tract infection.  Will admit to medical service and urology will consult for definitive management.  Consult Dr.[facility] hospitalist for admission.  Patient treated with Rocephin for urinary tract infection.  He has not required any pain control while in the emergency department.  Patient is nontoxic.  Blood pressures are normotensive.  Heart rate 96.  At this time not showing signs of sepsis.  Will proceed with admission.  Parents and patient updated on diagnostic findings and plan.     Final diagnoses:  Acute cystitis with hematuria  Kidney stone  Cerebral palsy, unspecified type [facility])     Edward Discharge Orders     None          Armenta Canning, MD 05/09/24 1842

## 2024-05-09 NOTE — Telephone Encounter (Signed)
 Patients mother calls nurse line reporting UTI symptoms.   She reports symptoms started yesterday with urinary frequency. She reports this morning there was bright red blood in toilet and he reported it hurt to go to the bathroom.   She denies any fevers and reports he seems well appearing. She reports she is unsure if his back or sides hurt. She reports he does have a hx of kidney stones.   Patient scheduled for tomorrow for evaluation as we do not have anything this afternoon.   Mother reports she feels better taking him to UC this afternoon.   She does request we keep the apt for tomorrow just in case. She reports she will call back to cancel if necessary.

## 2024-05-10 ENCOUNTER — Encounter (HOSPITAL_COMMUNITY): Payer: Self-pay | Admitting: Student

## 2024-05-10 ENCOUNTER — Ambulatory Visit: Admitting: Student

## 2024-05-10 DIAGNOSIS — Z8679 Personal history of other diseases of the circulatory system: Secondary | ICD-10-CM

## 2024-05-10 DIAGNOSIS — N3000 Acute cystitis without hematuria: Secondary | ICD-10-CM | POA: Diagnosis present

## 2024-05-10 DIAGNOSIS — N200 Calculus of kidney: Secondary | ICD-10-CM | POA: Diagnosis not present

## 2024-05-10 DIAGNOSIS — D72829 Elevated white blood cell count, unspecified: Secondary | ICD-10-CM | POA: Diagnosis present

## 2024-05-10 DIAGNOSIS — N201 Calculus of ureter: Secondary | ICD-10-CM | POA: Diagnosis not present

## 2024-05-10 DIAGNOSIS — R310 Gross hematuria: Secondary | ICD-10-CM | POA: Diagnosis not present

## 2024-05-10 LAB — COMPREHENSIVE METABOLIC PANEL WITH GFR
ALT: 23 U/L (ref 0–44)
AST: 15 U/L (ref 15–41)
Albumin: 3.1 g/dL — ABNORMAL LOW (ref 3.5–5.0)
Alkaline Phosphatase: 51 U/L (ref 38–126)
Anion gap: 10 (ref 5–15)
BUN: 14 mg/dL (ref 6–20)
CO2: 22 mmol/L (ref 22–32)
Calcium: 8.7 mg/dL — ABNORMAL LOW (ref 8.9–10.3)
Chloride: 104 mmol/L (ref 98–111)
Creatinine, Ser: 0.73 mg/dL (ref 0.61–1.24)
GFR, Estimated: >60 mL/min (ref >60)
Glucose, Bld: 90 mg/dL (ref 70–99)
Potassium: 3.8 mmol/L (ref 3.5–5.1)
Sodium: 136 mmol/L (ref 135–145)
Total Bilirubin: 1.6 mg/dL — ABNORMAL HIGH (ref 0.0–1.2)
Total Protein: 6.4 g/dL — ABNORMAL LOW (ref 6.5–8.1)

## 2024-05-10 LAB — PROTIME-INR
INR: 1.1 (ref 0.8–1.2)
Prothrombin Time: 14.8 s (ref 11.4–15.2)

## 2024-05-10 LAB — CBC WITH DIFFERENTIAL/PLATELET
Abs Immature Granulocytes: 0.06 10*3/uL (ref 0.00–0.07)
Basophils Absolute: 0 10*3/uL (ref 0.0–0.1)
Basophils Relative: 0 %
Eosinophils Absolute: 0.4 10*3/uL (ref 0.0–0.5)
Eosinophils Relative: 3 %
HCT: 43.6 % (ref 39.0–52.0)
Hemoglobin: 14.4 g/dL (ref 13.0–17.0)
Immature Granulocytes: 1 %
Lymphocytes Relative: 17 %
Lymphs Abs: 2.1 10*3/uL (ref 0.7–4.0)
MCH: 31.3 pg (ref 26.0–34.0)
MCHC: 33 g/dL (ref 30.0–36.0)
MCV: 94.8 fL (ref 80.0–100.0)
Monocytes Absolute: 1.1 10*3/uL — ABNORMAL HIGH (ref 0.1–1.0)
Monocytes Relative: 9 %
Neutro Abs: 8.7 10*3/uL — ABNORMAL HIGH (ref 1.7–7.7)
Neutrophils Relative %: 70 %
Platelets: 141 10*3/uL — ABNORMAL LOW (ref 150–400)
RBC: 4.6 MIL/uL (ref 4.22–5.81)
RDW: 12.4 % (ref 11.5–15.5)
WBC: 12.5 10*3/uL — ABNORMAL HIGH (ref 4.0–10.5)
nRBC: 0 % (ref 0.0–0.2)

## 2024-05-10 LAB — MAGNESIUM: Magnesium: 2.1 mg/dL (ref 1.7–2.4)

## 2024-05-10 LAB — LACTIC ACID, PLASMA: Lactic Acid, Venous: 1 mmol/L (ref 0.5–1.9)

## 2024-05-10 MED ORDER — FLUTICASONE PROPIONATE 50 MCG/ACT NA SUSP
2.0000 | Freq: Every day | NASAL | Status: DC
  Administered 2024-05-10 – 2024-05-11 (×2): 2 via NASAL
  Filled 2024-05-10: qty 16

## 2024-05-10 MED ORDER — LACTATED RINGERS IV SOLN: INTRAVENOUS | Status: AC

## 2024-05-10 NOTE — Plan of Care (Signed)

## 2024-05-10 NOTE — TOC Initial Note (Signed)
 Transition of Care [facility]) - Initial/Assessment Note    Patient Details  Name: Edward Alvarado MRN: [id number] Date of Birth: 01-11-1981  Transition of Care [facility]) CM/SW Contact:    Alfonse JONELLE Rex, RN Phone Number: 05/10/2024, 2:57 PM  Clinical Narrative:  NCM called to patient's mother, Edward Alvarado, to introduce role of TOC/NCM and review for dc planning, Erminio confirmed patient resides in a Living Supported Home ( previously [facility]), hx of CP, deafness, w/c bound,  states depending on patient's medical status at discharge, patient may dc home with her and her husband. TOC will continue to follow.                   Expected Discharge Plan: Home/Self Care Barriers to Discharge: Continued Medical Work up   Patient Goals and CMS Choice Patient states their goals for this hospitalization and ongoing recovery are:: patient resides at a supported living home ( previously [facility])          Expected Discharge Plan and Services       Living arrangements for the past 2 months: Group Home (Supported Living Home)                                      Prior Living Arrangements/Services Living arrangements for the past 2 months: Group Home (Supported Living Home) Lives with:: Other (Comment)              Current home services: DME (wheelchair)    Activities of Daily Living      Permission Sought/Granted                  Emotional Assessment              Admission diagnosis:  Kidney stone [N20.0] Acute cystitis with hematuria [N30.01] Right nephrolithiasis [N20.0] Cerebral palsy, unspecified type (HCC) [G80.9] Patient Active Problem List   Diagnosis Date Noted   Acute cystitis 05/10/2024   Leukocytosis 05/10/2024   History of essential hypertension 05/10/2024   Right nephrolithiasis 05/09/2024   Infrapatellar bursitis of left knee 06/29/2022   HTN (hypertension) 06/24/2022   Sepsis (HCC) 06/22/2021   Left ureteral stone  06/22/2021   Syncope and collapse 02/22/2021   Healthcare maintenance 07/28/2020   History of malnutrition 12/19/2019   Encounter for power mobility device assessment 05/24/2017   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 [place] [address] Phone: [phone] Fax: [phone]  [facility] - [facility] [address] [place] [address] Phone: [phone] Fax: [phone]  [facility] - [place], [place] - [address] [place] [address] Phone: [phone] Fax: [phone]  [facility] - [place], [place] - [address] [place] [address] Phone: [phone] Fax: [phone]  [id number] - [place], [place] - [address] AT [facility] [address] & [facility] [address] [place] [address] Phone: [phone] Fax: [phone]  [place] - [facility] [address] [place] [address] Phone: [phone] Fax: [phone]     Social Drivers of Health (SDOH) Social History: SDOH Screenings   Food Insecurity: No Food Insecurity (05/09/2024)  Housing: Low Risk  (05/09/2024)  Transportation Needs: No Transportation Needs (05/09/2024)  Utilities: Not At Risk (05/09/2024)  Depression (PHQ2-9): Low Risk  (03/08/2024)  Tobacco Use: Low Risk  (05/10/2024)   SDOH Interventions:     Readmission Risk Interventions    05/10/2024    2:54 PM  Readmission Risk Prevention Plan  Post  Dischage Appt Complete  Medication Screening Complete  Transportation Screening Complete

## 2024-05-10 NOTE — Consult Note (Signed)
 Urology Consult Note   Requesting Attending Physician:  Marcene Eva NOVAK, DO Service Providing Consult: Urology   Reason for Consult:  Nephrolithiasis  HPI: Edward Alvarado is seen in consultation for reasons noted above at the request of Howerter, Justin B, DO for evaluation of right mid ureteral stone.  Patient with hx of cerebral palsy who is wheelchair bound and deaf, HTN, HLD who presented to [facility] ED with initial concerns of hematuria from his facility, no obvious pain symptoms, imaging demonstrated a 6mm mid right ureteral stone as well as a non-obstructing stone in the right kidney, of note there is no hydronephrosis or hydroureter noted  He has been afebrile, did have an initial leukocytosis of 17.8 which has since downtrended to 12.5, hgb stable, no AKI cr has been at baseline, UA with moderate LE, >50 RBCs and WBCs with few bacteria, urine and blood cultures in progress.   Assessment:  43 y.o. male with hx above who presented with concern for hematuria from his facility with CT that demonstrates 6mm right mid ureteral stone with no hydronephrosis and no AKI but with a leukocytosis and a UA concerning for infection, leukocytosis improved with fluids and antibiotics  We discussed the indications for acute intervention including infected obstruction, bilateral ureteral obstruction or unilateral obstruction of solitary kidney as well as other less urgent indications for decompression which would included intractable pain, N/V, and acute renal injury. We also discussed the possible inability to place a ureteral stent in which case, the next intervention recommended would be a percutaneous nephrostomy tube.  Overall, the patient is improving with conservative management, there is low suspicion for clinically significant obstruction of the collecting system. Would recommend continued observation  to trend culture data and to send on appropriate antibiotics with plans for outpatient  ureteroscopy for treatment of stone.   Recommendations: Continue antibiotic treatment and fluid resuscitation, please plan to tailor antibiotics to culture data Keep patient NPO, if patient continues to remain stable throughout the day, can likely eat this afternoon Will arrange for outpatient USE after appropriate antibiotic treatment   Past Medical History: Past Medical History:  Diagnosis Date   Allergic rhinitis    Allergy    Atrophy, cortical    Lower left ventrical   Cerebral palsy (HCC)    W/C BOUND-ABLE TO TRANSFER W/C TO BED -DOES REQUIRE SOME HELP IN BATHROOM WITH CLOTHES--LIVES IN GROUP HOME CALLED [name]-HIS PARENTS ARE HIS LEGAL GUARDIANS   Complication of anesthesia    JAN 2012 EYE SURGERY AT [facility] WAS USED AND PT EXPERIENCED AIRWAY / OXYGENATION PROBLEMS.  PT HAD SUBSEQUENT SURGERIES  AFTER JAN 2012 AT [facility] AND PARENTS TOLD ET USED AND PT DID FINE.  PT HAD NO ANESTHESIA PROBLEMS WITH SURGERIES PRIOR TO THE JAN 2012 SURGERY.   Deafness    hearing aids-NOT WEARING AT PRESENT TIME; ABLE TO DO SIGN LANGUAGE WITH HIS PARENTS   Glaucoma    Hyperlipidemia    Kidney stone    Platelets decreased (HCC)    Prepatellar bursitis    Hospitalized in 2012 for sepsis secondary to bursisiitis   Scoliosis     Past Surgical History:  Past Surgical History:  Procedure Laterality Date   ADENOIDECTOMY     CYSTOSCOPY/RETROGRADE/URETEROSCOPY  10/01/2012   Procedure: CYSTOSCOPY/RETROGRADE/URETEROSCOPY;  Surgeon: Noretta Ferrara, MD;  Location: [facility];  Service: Urology;  Laterality: Left;  laser lithotripsy stone extraction on left   ESOPHAGOGASTRODUODENOSCOPY (EGD) WITH PROPOFOL  N/A 06/25/2021   Procedure: ESOPHAGOGASTRODUODENOSCOPY (EGD) WITH  PROPOFOL ;  Surgeon: Rollin Dover, MD;  Location: [facility];  Service: Endoscopy;  Laterality: N/A;   eye surgery  2012   [facility]   EYE SURGERY     4 RIGHT EYE SURGERIES FOR GLAUCOMA AND ONE  RT EYE CATARACT EXTRACTION; LEFT EYE CATARACT  EXTRACTION WITH  I STENTPROCEDURE FOR GLAUCOMA   HOLMIUM LASER APPLICATION  10/01/2012   Procedure: HOLMIUM LASER APPLICATION;  Surgeon: Noretta Ferrara, MD;  Location: [facility];  Service: Urology;  Laterality: Left;   multiple orthopedic procedures     MYRINGOTOMY     SAVORY DILATION N/A 06/25/2021   Procedure: SAVORY DILATION;  Surgeon: Rollin Dover, MD;  Location: [facility];  Service: Endoscopy;  Laterality: N/A;   SELECTIVE RHIZOTOMY PROCEDURE TO HELP REDUCE SPASTICITY AND CONTRACTURES      Medication: Current Facility-Administered Medications  Medication Dose Route Frequency Provider Last Rate Last Admin   acetaminophen  (TYLENOL ) tablet 650 mg  650 mg Oral Q6H PRN Howerter, Justin B, DO       Or   acetaminophen  (TYLENOL ) suppository 650 mg  650 mg Rectal Q6H PRN Howerter, Justin B, DO       cefTRIAXone  (ROCEPHIN ) 1 g in sodium chloride  0.9 % 100 mL IVPB  1 g Intravenous Q24H Howerter, Justin B, DO       fentaNYL  (SUBLIMAZE ) injection 25 mcg  25 mcg Intravenous Q2H PRN Howerter, Justin B, DO       fluticasone  (FLONASE ) 50 MCG/ACT nasal spray 2 spray  2 spray Each Nare Daily Howerter, Justin B, DO       lactated ringers  infusion   Intravenous Continuous Howerter, Justin B, DO 125 mL/hr at 05/09/24 2140 New Bag at 05/09/24 2140   melatonin tablet 3 mg  3 mg Oral QHS PRN Howerter, Justin B, DO       naloxone  (NARCAN ) injection 0.4 mg  0.4 mg Intravenous PRN Howerter, Justin B, DO       ondansetron  (ZOFRAN ) injection 4 mg  4 mg Intravenous Q6H PRN Howerter, Justin B, DO        Allergies: No Known Allergies  Social History: Social History   Tobacco Use   Smoking status: Never   Smokeless tobacco: Never  Substance Use Topics   Alcohol use: No   Drug use: No    Family History Family History  Problem Relation Age of Onset   Asthma Mother    Kidney Stones Mother    Osteoporosis Mother    Hypertension Mother    Hyperlipidemia Mother    Hyperlipidemia Father    Hodgkin's lymphoma  Paternal Uncle    Alzheimer's disease Paternal Grandmother    Stroke Paternal Grandmother    Asthma Maternal Grandmother    Stroke Paternal Grandfather     Review of Systems 10 systems were reviewed and are negative except as noted specifically in the HPI.  Objective   Vital signs in last 24 hours: BP 110/77 (BP Location: Right Arm)   Pulse 77   Temp (!) 97.5 F (36.4 C) (Oral)   Resp 18   SpO2 97%   Physical Exam General: NAD, resting, appropriate HEENT: [place]/AT Pulmonary: Normal work of breathing Cardiovascular: HDS, adequate peripheral perfusion Abdomen: Soft, NTTP, nondistended. GU: Voiding spontaneously, no CVA tenderness Extremities: warm and well perfused, contractures evident  Most Recent Labs: Lab Results  Component Value Date   WBC 12.5 (H) 05/10/2024   HGB 14.4 05/10/2024   HCT 43.6 05/10/2024   PLT 141 (L) 05/10/2024  Lab Results  Component Value Date   NA 136 05/10/2024   K 3.8 05/10/2024   CL 104 05/10/2024   CO2 22 05/10/2024   BUN 14 05/10/2024   CREATININE 0.73 05/10/2024   CALCIUM  8.7 (L) 05/10/2024   MG 2.1 05/10/2024    Lab Results  Component Value Date   INR 1.1 05/10/2024     Urine Culture: @[id number] (laburin,org,[id number],[id number])@   IMAGING: CT Renal Stone Study Result Date: 05/09/2024 CLINICAL DATA:  Hematuria EXAM: CT ABDOMEN AND PELVIS WITHOUT CONTRAST TECHNIQUE: Multidetector CT imaging of the abdomen and pelvis was performed following the standard protocol without IV contrast. RADIATION DOSE REDUCTION: This exam was performed according to the departmental dose-optimization program which includes automated exposure control, adjustment of the mA and/or kV according to patient size and/or use of iterative reconstruction technique. COMPARISON:  CT abdomen and pelvis 06/22/2021. FINDINGS: Lower chest: No acute abnormality. Hepatobiliary: No focal liver abnormality is seen. No gallstones, gallbladder wall thickening, or biliary  dilatation. The dome of the liver has been excluded from the exam. Pancreas: Unremarkable. No pancreatic ductal dilatation or surrounding inflammatory changes. Spleen: Normal in size without focal abnormality. Adrenals/Urinary Tract: There is mild wall thickening and inflammation surrounding the bladder. There is an elongated calculus measuring 6 by 2 x 3 mm in the region of the mid right ureter image 2/54. There is no hydronephrosis or hydroureter. There is a single nonobstructing right renal calculus measuring 7 mm. There is a left renal cyst with thin peripheral calcification measuring 4.3 cm, unchanged. Adrenal glands are within normal limits. Stomach/Bowel: Stomach is within normal limits. Appendix appears normal. No evidence of bowel wall thickening, distention, or inflammatory changes. Vascular/Lymphatic: No significant vascular findings are present. No enlarged abdominal or pelvic lymph nodes. Reproductive: Prostate gland is prominent in size. Bilateral testicles are high riding in the inguinal canals. Other: There is a small fat containing left inguinal hernia. There is no ascites. Musculoskeletal: There is an orthopedic screw in the proximal right femur. No acute fractures are seen. IMPRESSION: 1. 6 x 2 x 3 mm calculus in the region of the mid right ureter without hydronephrosis or hydroureter. 2. Nonobstructing right renal calculus. 3. Mild wall thickening and inflammation surrounding the bladder worrisome for cystitis. 4. Stable left renal cyst with thin peripheral calcification. 5. Bilateral testicles are high riding in the inguinal canals. 6. Small fat containing left inguinal hernia. Electronically Signed   By: Greig Pique M.D.   On: 05/09/2024 17:06    ------     Thank you for this consult. Please contact the urology consult pager with any further questions/concerns.

## 2024-05-10 NOTE — Progress Notes (Signed)
 PROGRESS NOTE    Edward Alvarado  FMW:[id number] DOB: 12-10-1980 DOA: 05/09/2024 PCP: Bryan Bianchi, MD    Brief Narrative:  Edward Alvarado is a 43 y.o. male with medical history significant for cerebral palsy, deafness, wheelchair dependent, essential hypertension, allergic rhinitis, who is admitted to [facility] on 05/09/2024 by way of transfer from [facility] with right ureterolithiasis as well as acute cystitis after presenting from home to to the latter facility for evaluation of gross hematuria.   Assessment and Plan:  Right ureterolithiasis: In the setting of new onset gross hematuria, intermittent right-sided flank discomfort, CT renal stone study shows evidence of a 6 x 2 x 3 mm stone in the mid right ureter, without overt evidence of obstruction, in the absence of any associated right-sided hydronephrosis and right hydroureter.  However, presentation does appear to be associated with urinary tract infection, as further detailed below.  Appears hemodynamically stable.  No known history of underlying diabetes. -Urology consulted: They recommend continuing antibiotics, advancing diet as tolerated, and outpatient follow-up          Sepsis due to acute cystitis:  -Urine culture was collected at [facility] prior to initiation of Rocephin -monitor for results - Blood culture pending  -Monitor for improvement         Essential Hypertension: -outpatient antihypertensive regimen including losartan , HCTZ.   - Plan to resume blood pressure meds once blood pressure reaches appropriate threshold        DVT prophylaxis: SCDs Start: 05/09/24 2106    Code Status: Full Code Family Communication: Father at bedside  Disposition Plan:  Level of care: Med-Surg Status is: Inpatient     Consultants:  Urology  Subjective: Patient denies any nausea or pain  Objective: Vitals:   05/09/24 2055 05/10/24 0154 05/10/24 0539 05/10/24 0939  BP: 118/83 110/77 109/85 118/81   Pulse: 92 77 84 90  Resp: 16 18 18 16   Temp: 98.7 F (37.1 C) (!) 97.5 F (36.4 C) 97.7 F (36.5 C) 97.8 F (36.6 C)  TempSrc: Oral Oral Oral Oral  SpO2: 97% 97% 97% 96%    Intake/Output Summary (Last 24 hours) at 05/10/2024 1155 Last data filed at 05/10/2024 1000 Gross per 24 hour  Intake 1041.67 ml  Output 525 ml  Net 516.67 ml   There were no vitals filed for this visit.  Examination:   General: Appearance:    Well developed, well nourished male in no acute distress     Lungs:      respirations unlabored  Heart:    Normal heart rate. Normal rhythm. No murmurs, rubs, or gallops.    MS:   All extremities are intact.    Neurologic:   Awake, alert, oriented x 3. No apparent focal neurological           defect.        Data Reviewed: I have personally reviewed following labs and imaging studies  CBC: Recent Labs  Lab 05/09/24 1545 05/10/24 0420  WBC 17.8* 12.5*  NEUTROABS 14.3* 8.7*  HGB 16.1 14.4  HCT 45.3 43.6  MCV 89.0 94.8  PLT 168 141*   Basic Metabolic Panel: Recent Labs  Lab 05/09/24 1545 05/10/24 0420  NA 134* 136  K 4.1 3.8  CL 99 104  CO2 22 22  GLUCOSE 100* 90  BUN 17 14  CREATININE 0.96 0.73  CALCIUM  10.1 8.7*  MG  --  2.1   GFR: CrCl cannot be calculated (Unknown ideal weight.). Liver Function Tests:  Recent Labs  Lab 05/09/24 1545 05/10/24 0420  AST 20 15  ALT 30 23  ALKPHOS 80 51  BILITOT 1.3* 1.6*  PROT 7.8 6.4*  ALBUMIN 4.3 3.1*   No results for input(s): LIPASE, AMYLASE in the last 168 hours. No results for input(s): AMMONIA in the last 168 hours. Coagulation Profile: Recent Labs  Lab 05/10/24 0420  INR 1.1   Cardiac Enzymes: No results for input(s): CKTOTAL, CKMB, CKMBINDEX, TROPONINI in the last 168 hours. BNP (last 3 results) No results for input(s): PROBNP in the last 8760 hours. HbA1C: No results for input(s): HGBA1C in the last 72 hours. CBG: No results for input(s): GLUCAP in the last  168 hours. Lipid Profile: No results for input(s): CHOL, HDL, LDLCALC, TRIG, CHOLHDL, LDLDIRECT in the last 72 hours. Thyroid  Function Tests: No results for input(s): TSH, T4TOTAL, FREET4, T3FREE, THYROIDAB in the last 72 hours. Anemia Panel: No results for input(s): VITAMINB12, FOLATE, FERRITIN, TIBC, IRON, RETICCTPCT in the last 72 hours. Sepsis Labs: Recent Labs  Lab 05/09/24 2313 05/10/24 0420  LATICACIDVEN 0.8 1.0    Recent Results (from the past 240 hours)  Culture, blood (Routine X 2) w Reflex to ID Panel     Status: None (Preliminary result)   Collection Time: 05/09/24 11:06 PM   Specimen: BLOOD RIGHT ARM  Result Value Ref Range Status   Specimen Description   Final    BLOOD RIGHT ARM Performed at [facility], [address], [place], [place] [address]    Special Requests   Final    BOTTLES DRAWN AEROBIC ONLY Blood Culture results may not be optimal due to an inadequate volume of blood received in culture bottles Performed at [facility], [address] [place], [place] [address]    Culture   Final    NO GROWTH < 12 HOURS Performed at [facility], [address] [place], [place] [address]    Report Status PENDING  Incomplete  Culture, blood (Routine X 2) w Reflex to ID Panel     Status: None (Preliminary result)   Collection Time: 05/09/24 11:13 PM   Specimen: BLOOD LEFT HAND  Result Value Ref Range Status   Specimen Description   Final    BLOOD LEFT HAND Performed at [facility], [address], [place], [place] [address]    Special Requests   Final    BOTTLES DRAWN AEROBIC ONLY Blood Culture results may not be optimal due to an inadequate volume of blood received in culture bottles Performed at [facility], [address], [place], [place] [address]    Culture   Final    NO GROWTH < 12 HOURS Performed at [facility], [address] [place], [place]  [address]    Report Status PENDING  Incomplete         Radiology Studies: CT Renal Stone Study Result Date: 05/09/2024 CLINICAL DATA:  Hematuria EXAM: CT ABDOMEN AND PELVIS WITHOUT CONTRAST TECHNIQUE: Multidetector CT imaging of the abdomen and pelvis was performed following the standard protocol without IV contrast. RADIATION DOSE REDUCTION: This exam was performed according to the departmental dose-optimization program which includes automated exposure control, adjustment of the mA and/or kV according to patient size and/or use of iterative reconstruction technique. COMPARISON:  CT abdomen and pelvis 06/22/2021. FINDINGS: Lower chest: No acute abnormality. Hepatobiliary: No focal liver abnormality is seen. No gallstones, gallbladder wall thickening, or biliary dilatation. The dome of the  liver has been excluded from the exam. Pancreas: Unremarkable. No pancreatic ductal dilatation or surrounding inflammatory changes. Spleen: Normal in size without focal abnormality. Adrenals/Urinary Tract: There is mild wall thickening and inflammation surrounding the bladder. There is an elongated calculus measuring 6 by 2 x 3 mm in the region of the mid right ureter image 2/54. There is no hydronephrosis or hydroureter. There is a single nonobstructing right renal calculus measuring 7 mm. There is a left renal cyst with thin peripheral calcification measuring 4.3 cm, unchanged. Adrenal glands are within normal limits. Stomach/Bowel: Stomach is within normal limits. Appendix appears normal. No evidence of bowel wall thickening, distention, or inflammatory changes. Vascular/Lymphatic: No significant vascular findings are present. No enlarged abdominal or pelvic lymph nodes. Reproductive: Prostate gland is prominent in size. Bilateral testicles are high riding in the inguinal canals. Other: There is a small fat containing left inguinal hernia. There is no ascites. Musculoskeletal: There is an orthopedic screw in the proximal  right femur. No acute fractures are seen. IMPRESSION: 1. 6 x 2 x 3 mm calculus in the region of the mid right ureter without hydronephrosis or hydroureter. 2. Nonobstructing right renal calculus. 3. Mild wall thickening and inflammation surrounding the bladder worrisome for cystitis. 4. Stable left renal cyst with thin peripheral calcification. 5. Bilateral testicles are high riding in the inguinal canals. 6. Small fat containing left inguinal hernia. Electronically Signed   By: Greig Pique M.D.   On: 05/09/2024 17:06        Scheduled Meds:  fluticasone   2 spray Each Nare Daily   Continuous Infusions:  cefTRIAXone  (ROCEPHIN )  IV       LOS: 1 day    Time spent: 45 minutes spent on chart review, discussion with nursing staff, consultants, updating family and interview/physical exam; more than 50% of that time was spent in counseling and/or coordination of care.    Edward RAYMOND Bowl, DO [facility] Available via Epic secure chat 7am-7pm After these hours, please refer to coverage provider listed on [email or web address] 05/10/2024, 11:55 AM

## 2024-05-11 ENCOUNTER — Other Ambulatory Visit (HOSPITAL_COMMUNITY): Payer: Self-pay

## 2024-05-11 DIAGNOSIS — Z8679 Personal history of other diseases of the circulatory system: Secondary | ICD-10-CM | POA: Diagnosis not present

## 2024-05-11 DIAGNOSIS — B9689 Other specified bacterial agents as the cause of diseases classified elsewhere: Secondary | ICD-10-CM | POA: Diagnosis present

## 2024-05-11 DIAGNOSIS — N3001 Acute cystitis with hematuria: Secondary | ICD-10-CM | POA: Diagnosis not present

## 2024-05-11 DIAGNOSIS — N200 Calculus of kidney: Secondary | ICD-10-CM | POA: Diagnosis not present

## 2024-05-11 LAB — CBC
HCT: 43.9 % (ref 39.0–52.0)
Hemoglobin: 14.6 g/dL (ref 13.0–17.0)
MCH: 30.8 pg (ref 26.0–34.0)
MCHC: 33.3 g/dL (ref 30.0–36.0)
MCV: 92.6 fL (ref 80.0–100.0)
Platelets: 142 10*3/uL — ABNORMAL LOW (ref 150–400)
RBC: 4.74 MIL/uL (ref 4.22–5.81)
RDW: 12.3 % (ref 11.5–15.5)
WBC: 8.2 10*3/uL (ref 4.0–10.5)
nRBC: 0 % (ref 0.0–0.2)

## 2024-05-11 LAB — BASIC METABOLIC PANEL WITH GFR
Anion gap: 11 (ref 5–15)
BUN: 15 mg/dL (ref 6–20)
CO2: 22 mmol/L (ref 22–32)
Calcium: 8.9 mg/dL (ref 8.9–10.3)
Chloride: 107 mmol/L (ref 98–111)
Creatinine, Ser: 0.76 mg/dL (ref 0.61–1.24)
GFR, Estimated: >60 mL/min (ref >60)
Glucose, Bld: 90 mg/dL (ref 70–99)
Potassium: 3.7 mmol/L (ref 3.5–5.1)
Sodium: 140 mmol/L (ref 135–145)

## 2024-05-11 LAB — URINE CULTURE: Culture: 80000 — AB

## 2024-05-11 MED ORDER — TAMSULOSIN HCL 0.4 MG PO CAPS
0.4000 mg | ORAL_CAPSULE | Freq: Every day | ORAL | 0 refills | Status: AC
  Filled 2024-05-11: qty 28, 28d supply, fill #0

## 2024-05-11 MED ORDER — CEFDINIR 300 MG PO CAPS
300.0000 mg | ORAL_CAPSULE | Freq: Two times a day (BID) | ORAL | 0 refills | Status: AC
  Filled 2024-05-11: qty 10, 5d supply, fill #0

## 2024-05-11 MED ORDER — LACTATED RINGERS IV SOLN: INTRAVENOUS | Status: DC

## 2024-05-11 NOTE — Discharge Summary (Addendum)
 Physician Discharge Summary   Patient: Edward Alvarado MRN: [id number] DOB: [birth year]  Admit date:     05/09/2024  Discharge date: 05/11/24  Discharge Physician: Zachary JINNY Ba   PCP: Bryan Bianchi, MD   Recommendations at discharge:   Please take all prescribed medications exactly as instructed. Please consume a low sodium diet. Bed rest unless you are getting out of bed with an assistance or an assistive device. Please maintain all outpatient follow-up appointments including follow-up with your primary care provider and your urologist.  If you do not hear from the urology clinic on Monday please feel free to call them with the attached contact information.  Please return to the emergency department if you develop abdominal pain, fevers in excess of 100.4 F, weakness or inability to tolerate oral intake. Daily Discharge Diagnoses: Principal Problem:   Right nephrolithiasis Active Problems:   Allergic rhinitis   Sepsis (HCC)   Acute cystitis   Leukocytosis   History of essential hypertension   UTI due to Klebsiella species  Resolved Problems:   * No resolved hospital problems. *   Hospital Course: 43 y.o. male with medical history significant for cerebral palsy, deafness, wheelchair dependent, essential hypertension, allergic rhinitis, who presented to [facility] department on 6/26 with complaints of hematuria and right-sided abdominal pain.  Upon evaluation in the emergency department urinalysis was suggestive of urinary tract infection.  CT imaging of the abdomen and pelvis revealed a 6 x 2 x 3 mm calculus in the region of the right mid ureter without hydronephrosis or hydroureter with an additional nonobstructing right renal calculus and wall thickening surrounding the bladder worrisome for concurrent cystitis.  Patient was initiated on intravenous antibiotics.  The hospitalist group was then called and patient was accepted for transfer to [facility]  for continued medical care.  Patient was evaluated by Dr. Devere with urology in consultation.  They recommended appropriate treatment with intravenous antibiotic therapy and intravenous fluids and they would arrange for outpatient evaluation and potential intervention in the outpatient setting.   In the days that followed patient's abdominal pain completely resolved as did the patient's hematuria.  Urine culture ended up growing out Klebsiella as the culprit organism.  Patient clinically felt that he was returning to baseline with improving appetite and strength.    Patient was therefore discharged home in improving and stable condition with his remaining course of antibiotics in the form of Omnicef twice daily.  Patient was additionally placed on a 4-week course of Flomax  and advised to hydrate himself aggressively in the outpatient setting.      Pain control - [place]  Controlled Substance Reporting System database was reviewed. and patient was instructed, not to drive, operate heavy machinery, perform activities at heights, swimming or participation in water activities or provide baby-sitting services while on Pain, Sleep and Anxiety Medications; until their outpatient Physician has advised to do so again. Also recommended to not to take more than prescribed Pain, Sleep and Anxiety Medications.   Consultants: Dr. Devere with Urology Procedures performed: none  Disposition: Home Diet recommendation:  Discharge Diet Orders (From admission, onward)     Start     Ordered   05/11/24 0000  Diet - low sodium heart healthy        05/11/24 1423           Cardiac diet  DISCHARGE MEDICATION: Allergies as of 05/11/2024   No Known Allergies      Medication List  TAKE these medications    cefdinir 300 MG capsule Commonly known as: OMNICEF Take 1 capsule (300 mg total) by mouth 2 (two) times daily for 5 days. Begin taking the morning of 6/29 Start taking on: May 12, 2024    cholecalciferol  25 MCG (1000 UNIT) tablet Commonly known as: VITAMIN D3 Take 1 tablet (1,000 Units total) by mouth daily. Needs vitamin D  check on next visit, discuss utility   Combigan  0.2-0.5 % ophthalmic solution Generic drug: brimonidine -timolol  INSTILL ONE DROP INTO EACH EYE 2 TIMES DAILY What changed: See the new instructions.   eucerin cream Apply topically as needed (dry skin). Generic okay   fluticasone  50 MCG/ACT nasal spray Commonly known as: FLONASE  Place 2 sprays into both nostrils daily.   latanoprost  0.005 % ophthalmic solution Commonly known as: Xalatan  Place 1 drop into both eyes at bedtime.   losartan -hydrochlorothiazide 100-12.5 MG tablet Commonly known as: HYZAAR TAKE (1) TABLET BY MOUTH ONCE DAILY. What changed: See the new instructions.   Metamucil Smooth Texture 58.6 % powder Generic drug: psyllium two teaspoons of powder with 8 ounces of water each morning at breakfast   montelukast  10 MG tablet Commonly known as: SINGULAIR  Take 1 tablet (10 mg total) by mouth at bedtime.   multivitamin Tabs tablet Take 1 tablet by mouth daily.   polyethylene glycol 17 g packet Commonly known as: MiraLax  Take 17 g by mouth daily as needed. What changed: reasons to take this   tamsulosin  0.4 MG Caps capsule Commonly known as: FLOMAX  Take 1 capsule (0.4 mg total) by mouth daily for 28 days.        Follow-up Information     Bryan Bianchi, MD. Schedule an appointment as soon as possible for a visit.   Specialty: Family Medicine Contact information: [address] [place] [address] [phone]         Devere Lonni Righter, MD. Schedule an appointment as soon as possible for a visit in 1 week(s).   Specialty: Urology Why: works with Dr. Jesusa Pass information: [address] [place] [address] [place] [address] [phone]                 Discharge Exam: There were no vitals filed for this visit.  Constitutional: Awake  alert and oriented x3, no associated distress.   Respiratory: clear to auscultation bilaterally, no wheezing, no crackles. Normal respiratory effort. No accessory muscle use.  Cardiovascular: Regular rate and rhythm, no murmurs / rubs / gallops. No extremity edema. 2+ pedal pulses. No carotid bruits.  Abdomen: Abdomen is soft and nontender.  No evidence of intra-abdominal masses.  Positive bowel sounds noted in all quadrants.   Musculoskeletal: No joint deformity upper and lower extremities. Good ROM, no contractures. Normal muscle tone.     Condition at discharge: fair  The results of significant diagnostics from this hospitalization (including imaging, microbiology, ancillary and laboratory) are listed below for reference.   Imaging Studies: CT Renal Stone Study Result Date: 05/09/2024 CLINICAL DATA:  Hematuria EXAM: CT ABDOMEN AND PELVIS WITHOUT CONTRAST TECHNIQUE: Multidetector CT imaging of the abdomen and pelvis was performed following the standard protocol without IV contrast. RADIATION DOSE REDUCTION: This exam was performed according to the departmental dose-optimization program which includes automated exposure control, adjustment of the mA and/or kV according to patient size and/or use of iterative reconstruction technique. COMPARISON:  CT abdomen and pelvis 06/22/2021. FINDINGS: Lower chest: No acute abnormality. Hepatobiliary: No focal liver abnormality is seen. No gallstones, gallbladder wall  thickening, or biliary dilatation. The dome of the liver has been excluded from the exam. Pancreas: Unremarkable. No pancreatic ductal dilatation or surrounding inflammatory changes. Spleen: Normal in size without focal abnormality. Adrenals/Urinary Tract: There is mild wall thickening and inflammation surrounding the bladder. There is an elongated calculus measuring 6 by 2 x 3 mm in the region of the mid right ureter image 2/54. There is no hydronephrosis or hydroureter. There is a single  nonobstructing right renal calculus measuring 7 mm. There is a left renal cyst with thin peripheral calcification measuring 4.3 cm, unchanged. Adrenal glands are within normal limits. Stomach/Bowel: Stomach is within normal limits. Appendix appears normal. No evidence of bowel wall thickening, distention, or inflammatory changes. Vascular/Lymphatic: No significant vascular findings are present. No enlarged abdominal or pelvic lymph nodes. Reproductive: Prostate gland is prominent in size. Bilateral testicles are high riding in the inguinal canals. Other: There is a small fat containing left inguinal hernia. There is no ascites. Musculoskeletal: There is an orthopedic screw in the proximal right femur. No acute fractures are seen. IMPRESSION: 1. 6 x 2 x 3 mm calculus in the region of the mid right ureter without hydronephrosis or hydroureter. 2. Nonobstructing right renal calculus. 3. Mild wall thickening and inflammation surrounding the bladder worrisome for cystitis. 4. Stable left renal cyst with thin peripheral calcification. 5. Bilateral testicles are high riding in the inguinal canals. 6. Small fat containing left inguinal hernia. Electronically Signed   By: Greig Pique M.D.   On: 05/09/2024 17:06    Microbiology: Results for orders placed or performed during the hospital encounter of 05/09/24  Urine Culture     Status: Abnormal   Collection Time: 05/09/24  4:25 PM   Specimen: Urine, Clean Catch  Result Value Ref Range Status   Specimen Description   Final    URINE, CLEAN CATCH Performed at [facility], [address] [place], [place] [address]    Special Requests   Final    NONE Performed at [facility], [address], [place], [place] [address]    Culture 80,000 COLONIES/mL KLEBSIELLA PNEUMONIAE (A)  Final   Report Status 05/11/2024 FINAL  Final   Organism ID, Bacteria KLEBSIELLA PNEUMONIAE (A)  Final      Susceptibility   Klebsiella  pneumoniae - MIC*    AMPICILLIN  RESISTANT Resistant     CEFAZOLIN <=4 SENSITIVE Sensitive     CEFEPIME <=0.12 SENSITIVE Sensitive     CEFTRIAXONE  <=0.25 SENSITIVE Sensitive     CIPROFLOXACIN  <=0.25 SENSITIVE Sensitive     GENTAMICIN <=1 SENSITIVE Sensitive     IMIPENEM <=0.25 SENSITIVE Sensitive     NITROFURANTOIN 64 INTERMEDIATE Intermediate     TRIMETH/SULFA <=20 SENSITIVE Sensitive     AMPICILLIN /SULBACTAM 4 SENSITIVE Sensitive     PIP/TAZO <=4 SENSITIVE Sensitive ug/mL    * 80,000 COLONIES/mL KLEBSIELLA PNEUMONIAE  Culture, blood (Routine X 2) w Reflex to ID Panel     Status: None (Preliminary result)   Collection Time: 05/09/24 11:06 PM   Specimen: BLOOD RIGHT ARM  Result Value Ref Range Status   Specimen Description   Final    BLOOD RIGHT ARM Performed at [facility], [address] [place], [place] [address]    Special Requests   Final    BOTTLES DRAWN AEROBIC ONLY Blood Culture results may not be optimal due to an inadequate volume of blood received in culture bottles Performed at [facility], [address] [place],  [place] [address]    Culture   Final    NO GROWTH 1 DAY Performed at [facility], [address], [place], [place] [address]    Report Status PENDING  Incomplete  Culture, blood (Routine X 2) w Reflex to ID Panel     Status: None (Preliminary result)   Collection Time: 05/09/24 11:13 PM   Specimen: BLOOD LEFT HAND  Result Value Ref Range Status   Specimen Description   Final    BLOOD LEFT HAND Performed at [facility], [address], [place], [place] [address]    Special Requests   Final    BOTTLES DRAWN AEROBIC ONLY Blood Culture results may not be optimal due to an inadequate volume of blood received in culture bottles Performed at [facility], [address], [place], [place] [address]    Culture   Final    NO GROWTH 1 DAY Performed at [facility], [address],  [place], [place] [address]    Report Status PENDING  Incomplete    Labs: CBC: Recent Labs  Lab 05/09/24 1545 05/10/24 0420 05/11/24 0653  WBC 17.8* 12.5* 8.2  NEUTROABS 14.3* 8.7*  --   HGB 16.1 14.4 14.6  HCT 45.3 43.6 43.9  MCV 89.0 94.8 92.6  PLT 168 141* 142*   Basic Metabolic Panel: Recent Labs  Lab 05/09/24 1545 05/10/24 0420 05/11/24 0653  NA 134* 136 140  K 4.1 3.8 3.7  CL 99 104 107  CO2 22 22 22   GLUCOSE 100* 90 90  BUN 17 14 15   CREATININE 0.96 0.73 0.76  CALCIUM  10.1 8.7* 8.9  MG  --  2.1  --    Liver Function Tests: Recent Labs  Lab 05/09/24 1545 05/10/24 0420  AST 20 15  ALT 30 23  ALKPHOS 80 51  BILITOT 1.3* 1.6*  PROT 7.8 6.4*  ALBUMIN 4.3 3.1*   CBG: No results for input(s): GLUCAP in the last 168 hours.  Discharge time spent: greater than 30 minutes.  Signed: Zachary JINNY Ba, MD [facility] 05/11/2024

## 2024-05-11 NOTE — Discharge Instructions (Addendum)
 Please take all prescribed medications exactly as instructed.  You have been prescribed a course of both antibiotic therapy as well as Flomax  to help facilitate the passage of the stone. Please consume a low sodium diet. Please attempt to drink between 2 and 2.5 L of water daily to help increase the chances of the stone passing. Bed rest unless you are getting out of bed with an assistance or an assistive device. Please maintain all outpatient follow-up appointments including follow-up with your primary care provider and your urologist.  If you do not hear from the urology clinic on Monday please feel free to call them with the attached contact information.  Please return to the emergency department if you develop abdominal pain, fevers in excess of 100.4 F, weakness or inability to tolerate oral intake.

## 2024-05-11 NOTE — Progress Notes (Signed)
 AVS reprinted w/ med times for pt's parents and another copy for the group home where patient lives. TOC meds delivered to pt in room by this RN.

## 2024-05-11 NOTE — Hospital Course (Addendum)
 43 y.o. male with medical history significant for cerebral palsy, deafness, wheelchair dependent, essential hypertension, allergic rhinitis, who presented to [facility] emergency department on 6/26 with complaints of hematuria and right-sided abdominal pain.  Upon evaluation in the emergency department urinalysis was suggestive of urinary tract infection.  CT imaging of the abdomen and pelvis revealed a 6 x 2 x 3 mm calculus in the region of the right mid ureter without hydronephrosis or hydroureter with an additional nonobstructing right renal calculus and wall thickening surrounding the bladder worrisome for concurrent cystitis.  Patient was initiated on intravenous antibiotics.  The hospitalist group was then called and patient was accepted for transfer to [facility] for continued medical care.  Patient was evaluated by Dr. Devere with urology in consultation.  They recommended appropriate treatment with intravenous antibiotic therapy and intravenous fluids and they would arrange for outpatient evaluation and potential intervention in the outpatient setting.   In the days that followed patient's abdominal pain completely resolved as did the patient's hematuria.  Urine culture ended up growing out Klebsiella as the culprit organism.  Patient clinically felt that he was returning to baseline with improving appetite and strength.    Patient was therefore discharged home in improving and stable condition with his remaining course of antibiotics in the form of Omnicef twice daily.  Patient was additionally placed on a 4-week course of Flomax  and advised to hydrate himself aggressively in the outpatient setting.

## 2024-05-13 ENCOUNTER — Telehealth: Payer: Self-pay

## 2024-05-13 DIAGNOSIS — N202 Calculus of kidney with calculus of ureter: Secondary | ICD-10-CM | POA: Diagnosis not present

## 2024-05-13 NOTE — Transitions of Care (Post Inpatient/ED Visit) (Signed)
 05/13/2024  Name: Edward Alvarado MRN: [id number] DOB: [birth year]  Today's TOC FU Call Status: Today's TOC FU Call Status:: Successful TOC FU Call Completed TOC FU Call Complete Date: 05/13/24 Patient's Name and Date of Birth confirmed.  Transition Care Management Follow-up Telephone Call Date of Discharge: 05/11/24 Discharge Facility: [facility]) Type of Discharge: Inpatient Admission Primary Inpatient Discharge Diagnosis:: calculus of kidney How have you been since you were released from the hospital?: Better Any questions or concerns?: No  Items Reviewed: Did you receive and understand the discharge instructions provided?: Yes Medications obtained,verified, and reconciled?: Yes (Medications Reviewed) Any new allergies since your discharge?: No Dietary orders reviewed?: Yes Do you have support at home?: Yes People in Home [RPT]: parent(s)  Medications Reviewed Today: Medications Reviewed Today     Reviewed by Emmitt Pan, LPN (Licensed Practical Nurse) on 05/13/24 at 1035  Med List Status: <None>   Medication Order Taking? Sig Documenting Provider Last Dose Status Informant  cefdinir (OMNICEF) 300 MG capsule [id number] Yes Take 1 capsule (300 mg total) by mouth 2 (two) times daily for 5 days. Begin taking the morning of 6/29 Shalhoub, Zachary PARAS, MD  Active   cholecalciferol  (VITAMIN D3) 25 MCG (1000 UNIT) tablet [id number] Yes Take 1 tablet (1,000 Units total) by mouth daily. Needs vitamin D  check on next visit, discuss utility Bryan Bianchi, MD  Active Mother, Pharmacy Records, Multiple Informants  COMBIGAN  0.2-0.5 % ophthalmic solution [id number] Yes INSTILL ONE DROP INTO EACH EYE 2 TIMES DAILY  Patient taking differently: Place 1 drop into both eyes 2 (two) times daily.   Henriette Mora, MD  Active Mother, Pharmacy Records, Multiple Informants           Med Note STEFFI, ADELITA   Fri May 10, 2024 11:18 AM)    fluticasone  (FLONASE ) 50 MCG/ACT nasal spray  [id number] Yes Place 2 sprays into both nostrils daily. Bryan Bianchi, MD  Active Mother, Pharmacy Records, Multiple Informants  latanoprost  (XALATAN ) 0.005 % ophthalmic solution [id number] Yes Place 1 drop into both eyes at bedtime. Bryan Bianchi, MD  Active Mother, Pharmacy Records, Multiple Informants  losartan -hydrochlorothiazide (HYZAAR) 100-12.5 MG tablet [id number] Yes TAKE (1) TABLET BY MOUTH ONCE DAILY.  Patient taking differently: Take 1 tablet by mouth daily.   Bryan Bianchi, MD  Active Mother, Pharmacy Records, Multiple Informants  montelukast  (SINGULAIR ) 10 MG tablet [id number] Yes Take 1 tablet (10 mg total) by mouth at bedtime. Bryan Bianchi, MD  Active Mother, Pharmacy Records, Multiple Informants  multivitamin (ONE-A-DAY MEN'S) TABS tablet [id number] Yes Take 1 tablet by mouth daily. Bryan Bianchi, MD  Active Mother, Pharmacy Records, Multiple Informants  polyethylene glycol (MIRALAX ) 17 g packet [id number] Yes Take 17 g by mouth daily as needed.  Patient taking differently: Take 17 g by mouth daily as needed for mild constipation or moderate constipation.   Will Almarie MATSU, MD  Active Mother, Pharmacy Records, Multiple Informants  psyllium (METAMUCIL SMOOTH TEXTURE) 58.6 % powder [id number] Yes two teaspoons of powder with 8 ounces of water each morning at breakfast Bryan Bianchi, MD  Active Mother, Pharmacy Records, Multiple Informants  Skin Protectants, Misc. (EUCERIN) cream [id number] Yes Apply topically as needed (dry skin). Generic okay Madelon Donald HERO, DO  Active Mother, Pharmacy Records, Multiple Informants  tamsulosin  (FLOMAX ) 0.4 MG CAPS capsule [id number] Yes Take 1 capsule (0.4 mg total) by mouth daily for 28 days. Shalhoub, Zachary PARAS, MD  Active   Med List Note Buena Vernell PARAS, CPhT 06/23/21 1103): [address] [phone] or [phone] Gabriella Kitty)            Home Care and Equipment/Supplies: Were Home Health Services Ordered?: NA Any new  equipment or medical supplies ordered?: NA  Functional Questionnaire: Do you need assistance with bathing/showering or dressing?: Yes Do you need assistance with meal preparation?: Yes Do you need assistance with eating?: No Do you have difficulty maintaining continence: Yes Do you need assistance with getting out of bed/getting out of a chair/moving?: Yes Do you have difficulty managing or taking your medications?: Yes  Follow up appointments reviewed: PCP Follow-up appointment confirmed?: Yes Date of PCP follow-up appointment?: 05/15/24 Follow-up Provider: [facility] Follow-up appointment confirmed?: Yes Date of Specialist follow-up appointment?: 05/13/24 Follow-Up Specialty Provider:: uro Do you need transportation to your follow-up appointment?: No Do you understand care options if your condition(s) worsen?: Yes-patient verbalized understanding    SIGNATURE Julian Lemmings, LPN [facility] Nurse Health Advisor Direct Dial [phone]

## 2024-05-15 ENCOUNTER — Ambulatory Visit (INDEPENDENT_AMBULATORY_CARE_PROVIDER_SITE_OTHER): Admitting: Student

## 2024-05-15 ENCOUNTER — Other Ambulatory Visit: Payer: Self-pay | Admitting: Urology

## 2024-05-15 VITALS — BP 124/85 | HR 80 | Wt 161.4 lb

## 2024-05-15 DIAGNOSIS — E785 Hyperlipidemia, unspecified: Secondary | ICD-10-CM

## 2024-05-15 DIAGNOSIS — N200 Calculus of kidney: Secondary | ICD-10-CM

## 2024-05-15 LAB — CULTURE, BLOOD (ROUTINE X 2)
Culture: NO GROWTH
Culture: NO GROWTH

## 2024-05-15 MED ORDER — MONTELUKAST SODIUM 10 MG PO TABS
10.0000 mg | ORAL_TABLET | Freq: Every day | ORAL | 0 refills | Status: DC
Start: 2024-05-15 — End: 2024-06-25

## 2024-05-15 NOTE — Assessment & Plan Note (Signed)
 Denies any pain since discharge is currently being managed by urologist outpatient with plan for upcoming procedure. - Encourage adequate hydration - Advised to follow-up with urologist - Return precautions discussed with patient.

## 2024-05-15 NOTE — Progress Notes (Signed)
    SUBJECTIVE:   CHIEF COMPLAINT / HPI:   43 yo with history of cerebral palsy presenting for HOSPITAL follow-up.  He is accompanied by her father and mother today.  Admitted for nephrolithiasis complicated by UTI and cystitis  Urine culture grew Klebsiella. Patient was treated with IV antibiotics and sent home on Omnicef  Urology was consulted and Plan was to follow up with them for outpatient management   Today patient denies any pain since discharge and family reports that he saw urologist 2 days ago and plan is to complete a procedure  in the coming week.  PERTINENT  PMH / PSH: Reviewed   OBJECTIVE:   BP 124/85   Pulse 80   Wt 161 lb 6.4 oz (73.2 kg)   SpO2 97%   BMI 23.16 kg/m    Physical Exam General: Alert, well appearing, NAD Cardiovascular: RRR, No Murmurs, Normal S2/S2 Respiratory: CTAB, No wheezing or Rales Abdomen: No distension or tenderness Extremities: No edema on extremities   Skin: Warm and dry  ASSESSMENT/PLAN:   Right nephrolithiasis Denies any pain since discharge is currently being managed by urologist outpatient with plan for upcoming procedure. - Encourage adequate hydration - Advised to follow-up with urologist - Return precautions discussed with patient.  Cerebral palsy (HCC) PT and OT currently reports due to recent hospitalization.  Chronic nature of patient's disease process, it is appropriate to continue physical therapy and occupational therapy.  When to call and give verbal order for continued therapy.     Norleen April, MD [facility]

## 2024-05-15 NOTE — Patient Instructions (Addendum)
 Pleasure to meet you today.  Glad to hear that you are doing well.  We we will call the patient with therapy/physical therapy agency for your verbal order to restart physical therapy.  Please make sure to continue following up with your neurologist appointment.  It is very imperative that you make sure you are drinking adequately.

## 2024-05-15 NOTE — Assessment & Plan Note (Signed)
 PT and OT currently reports due to recent hospitalization.  Chronic nature of patient's disease process, it is appropriate to continue physical therapy and occupational therapy.  When to call and give verbal order for continued therapy.

## 2024-05-16 NOTE — Progress Notes (Addendum)
 COVID Vaccine received:  []  No [x]  Yes Date of any COVID positive Test in last 90 days: no PCP - Raguel Lee DO at [facility] health Cardiologist -   Chest x-ray -  EKG -  05/09/24 Epic Stress Test -  ECHO - 05/28/21 Epic Cardiac Cath -   Bowel Prep - [x]  No  []   Yes ______  Pacemaker / ICD device [x]  No []  Yes   Spinal Cord Stimulator:[x]  No []  Yes       History of Sleep Apnea? [x]  No []  Yes   CPAP used?- [x]  No []  Yes    Does the patient monitor blood sugar?          [x]  No []  Yes  []  N/A  Patient has: []  NO Hx DM   []  Pre-DM                 []  DM1  []   DM2 Does patient have a [employer] or Dexacom? []  No []  Yes   Fasting Blood Sugar Ranges-  Checks Blood Sugar _____ times a day  GLP1 agonist / usual dose - no GLP1 instructions:  SGLT-2 inhibitors / usual dose - no SGLT-2 instructions:   Blood Thinner / Instructions:no Aspirin Instructions:no  Comments:   Activity level: Patient is unable to climb a flight of stairs without difficulty; [x]  No CP  [x]  No SOB, but would have ___non ambulatory   Patient  can not perform ADLs without assistance.   Anesthesia review: Has CP, deaf, non ambulatory, HTN, In hospital 6/26-6/28  Patient denies shortness of breath, fever, cough and chest pain at PAT appointment.  Patient verbalized understanding and agreement to the Pre-Surgical Instructions that were given to them at this PAT appointment. Patient was also educated of the need to review these PAT instructions again prior to his/her surgery.I reviewed the appropriate phone numbers to call if they have any and questions or concerns.

## 2024-05-16 NOTE — Patient Instructions (Signed)
 SURGICAL WAITING ROOM VISITATION  Patients having surgery or a procedure may have no more than 2 support people in the waiting area - these visitors may rotate.    Children under the age of 107 must have an adult with them who is not the patient.  Visitors with respiratory illnesses are discouraged from visiting and should remain at home.  If the patient needs to stay at the hospital during part of their recovery, the visitor guidelines for inpatient rooms apply. Pre-op nurse will coordinate an appropriate time for 1 support person to accompany patient in pre-op.  This support person may not rotate.    Please refer to the [facility] website for the visitor guidelines for Inpatients (after your surgery is over and you are in a regular room).       Your procedure is scheduled on: 05/22/24   Report to [facility] Main Entrance    Report to admitting at 7:30 AM   Call this number if you have problems the morning of surgery [phone]   Do not eat food or drink liquids :After Midnight.but may have sips of water with meds      Oral Hygiene is also important to reduce your risk of infection.                                    Remember - BRUSH YOUR TEETH THE MORNING OF SURGERY WITH YOUR REGULAR TOOTHPASTE   Stop all vitamins and herbal supplements 7 days before surgery.   Take these medicines the morning of surgery with A SIP OF WATER: Montelukast (singulair ), tamsulosin , nasal spray, eye drops             You may not have any metal on your body including hair pins, jewelry, and body piercing             Do not wear make-up, lotions, powders, perfumes/cologne, or deodorant              Men may shave face and neck.   Do not bring valuables to the hospital. [facility] IS NOT             RESPONSIBLE   FOR VALUABLES.   Contacts, glasses, dentures or bridgework may not be worn into surgery.  DO NOT BRING YOUR HOME MEDICATIONS TO THE HOSPITAL. PHARMACY WILL DISPENSE  MEDICATIONS LISTED ON YOUR MEDICATION LIST TO YOU DURING YOUR ADMISSION IN THE HOSPITAL!    Patients discharged on the day of surgery will not be allowed to drive home.  Someone NEEDS to stay with you for the first 24 hours after anesthesia.   Special Instructions: Bring a copy of your healthcare power of attorney and living will documents the day of surgery if you haven't scanned them before.              Please read over the following fact sheets you were given: IF YOU HAVE QUESTIONS ABOUT YOUR PRE-OP INSTRUCTIONS PLEASE CALL [phone] [place]   If you received a COVID test during your pre-op visit  it is requested that you wear a mask when out in public, stay away from anyone that may not be feeling well and notify your surgeon if you develop symptoms. If you test positive for Covid or have been in contact with anyone that has tested positive in the last 10 days please notify you surgeon.    [facility] - Preparing  for Surgery Before surgery, you can play an important role.  Because skin is not sterile, your skin needs to be as free of germs as possible.  You can reduce the number of germs on your skin by washing with CHG (chlorahexidine gluconate) soap before surgery.  CHG is an antiseptic cleaner which kills germs and bonds with the skin to continue killing germs even after washing. Please DO NOT use if you have an allergy to CHG or antibacterial soaps.  If your skin becomes reddened/irritated stop using the CHG and inform your nurse when you arrive at Short Stay. Do not shave (including legs and underarms) for at least 48 hours prior to the first CHG shower.  You may shave your face/neck.  Please follow these instructions carefully:  1.  Shower with CHG Soap the night before surgery and the  morning of surgery.  2.  If you choose to wash your hair, wash your hair first as usual with your normal  shampoo.  3.  After you shampoo, rinse your hair and body thoroughly to remove the shampoo.                              4.  Use CHG as you would any other liquid soap.  You can apply chg directly to the skin and wash.  Gently with a scrungie or clean washcloth.  5.  Apply the CHG Soap to your body ONLY FROM THE NECK DOWN.   Do   not use on face/ open                           Wound or open sores. Avoid contact with eyes, ears mouth and   genitals (private parts).                       Wash face,  Genitals (private parts) with your normal soap.             6.  Wash thoroughly, paying special attention to the area where your    surgery  will be performed.  7.  Thoroughly rinse your body with warm water from the neck down.  8.  DO NOT shower/wash with your normal soap after using and rinsing off the CHG Soap.                9.  Pat yourself dry with a clean towel.            10.  Wear clean pajamas.            11.  Place clean sheets on your bed the night of your first shower and do not  sleep with pets. Day of Surgery : Do not apply any lotions/deodorants the morning of surgery.  Please wear clean clothes to the hospital/surgery center.  FAILURE TO FOLLOW THESE INSTRUCTIONS MAY RESULT IN THE CANCELLATION OF YOUR SURGERY  PATIENT SIGNATURE_________________________________  NURSE SIGNATURE__________________________________  ________________________________________________________________________

## 2024-05-20 ENCOUNTER — Encounter (HOSPITAL_COMMUNITY): Payer: Self-pay

## 2024-05-20 ENCOUNTER — Other Ambulatory Visit: Payer: Self-pay

## 2024-05-20 ENCOUNTER — Encounter (HOSPITAL_COMMUNITY)
Admission: RE | Admit: 2024-05-20 | Discharge: 2024-05-20 | Disposition: A | Discharge status: Home / Self Care | Source: Ambulatory Visit | Attending: Urology | Admitting: Urology

## 2024-05-20 HISTORY — DX: Myoneural disorder, unspecified: G70.9

## 2024-05-20 HISTORY — DX: Personal history of urinary calculi: Z87.442

## 2024-05-20 NOTE — Progress Notes (Signed)
 Called pt's parents to do PST interview over the phone as pt. Is deaf. Mother of patient identified patient by name and DOB. She answered all questions. Pre op Instructions given to mother. Questions answered. Instructed Mom to call admitting to do pre admit. Phone number given.

## 2024-05-21 NOTE — Anesthesia Preprocedure Evaluation (Signed)
 Anesthesia Evaluation  Patient identified by MRN, date of birth, ID band Patient awake    Reviewed: Allergy & Precautions, NPO status , Patient's Chart, lab work & pertinent test results  History of Anesthesia Complications Negative for: history of anesthetic complications  Airway Mallampati: II  TM Distance: >3 FB Neck ROM: Full    Dental no notable dental hx.    Pulmonary neg pulmonary ROS   Pulmonary exam normal        Cardiovascular hypertension, Pt. on medications Normal cardiovascular exam     Neuro/Psych cerebral palsy, no LE motor function    GI/Hepatic negative GI ROS, Neg liver ROS,,,  Endo/Other  negative endocrine ROS    Renal/GU RIGHT URETERAL AND RENAL STONES     Musculoskeletal negative musculoskeletal ROS (+)    Abdominal   Peds  Hematology negative hematology ROS (+)   Anesthesia Other Findings Day of surgery medications reviewed with patient.  Reproductive/Obstetrics                              Anesthesia Physical Anesthesia Plan  ASA: 3  Anesthesia Plan: General   Post-op Pain Management: Tylenol  PO (pre-op)*   Induction: Intravenous  PONV Risk Score and Plan: 2 and Treatment may vary due to age or medical condition, Ondansetron , Dexamethasone  and Midazolam   Airway Management Planned: LMA  Additional Equipment: None  Intra-op Plan:   Post-operative Plan: Extubation in OR  Informed Consent: I have reviewed the patients History and Physical, chart, labs and discussed the procedure including the risks, benefits and alternatives for the proposed anesthesia with the patient or authorized representative who has indicated his/her understanding and acceptance.     Dental advisory given and Consent reviewed with POA  Plan Discussed with: CRNA  Anesthesia Plan Comments: (Consent reviewed with parents at bedside. Patient deaf, uses ASL. Lawence, MD)          Anesthesia Quick Evaluation

## 2024-05-21 NOTE — H&P (Signed)
 Office Visit Report     05/13/2024   --------------------------------------------------------------------------------   Edward Alvarado  MRN: [id number]  DOB: [birth year], 43 year old Male  SSN:    PRIMARY CARE:  [facility] [place]  PRIMARY CARE FAX:  [phone]  REFERRING:    PROVIDER:  Lonni Han, M.D.  LOCATION:  [facility] [phone]     --------------------------------------------------------------------------------   CC/HPI: Right ureteral stone   Edward Alvarado is a 43 year old male with a history of cerebral palsy and kidney stones.   05/13/24: The patient is here today for routine follow-up. He was seen in consultation in the hospital due to a nonobstructing 6 mm right mid ureteral stone associated with gross hematuria. He also has an 8 mm right renal stone on recent imaging. His patients are within today and they state that he has done well since his discharge and he has not indicated any episodes of pain nor are they seen any visible blood in his urine.     ALLERGIES: Iodine SOLN    MEDICATIONS: Tamsulosin  HCl 0.4 MG Capsule  Cefdinir   Claritin  10 MG Oral Tablet Oral  Combigan  0.2-0.5 % Solution  Combigan  0.2-0.5 % Ophthalmic Solution Ophthalmic  Flonase  Allergy Relief  Hyzaar  Latanoprost   Metamucil  Multivitamins CAPS Oral  Singulair  10 MG Tablet  Vitamin D3     GU PSH: Cysto Uretero Lithotripsy - 2013       PSH Notes: Cystoscopy With Ureteroscopy With Lithotripsy, Cataract Surgery, Cataract Surgery, Tonsillectomy With Adenoidectomy, Hip Surgery, cerebral palsy   NON-GU PSH: Remove Tonsils And Adenoids - 2013     GU PMH: Ureteral calculus, Calculus of ureter - 2014      PMH Notes: Cardiovascular and Mediastinum  HTN (hypertension)   Respiratory  Allergic rhinitis   Nervous and Auditory   Cerebral palsy (HCC)   LOSS, CONDUCTIVE HEARING, BILATERAL  Cerumen impaction   Musculoskeletal and Integument   Idiopathic  scoliosis and kyphoscoliosis  Infrapatellar bursitis of left knee   Genitourinary  Left ureteral stone  Right nephrolithiasis  Acute cystitis  UTI due to Klebsiella species   Hematopoietic and Hemostatic  THROMBOCYTOPENIA NOS   Other   Hyperlipidemia   OAG (open angle glaucoma), juvenile  Vitamin D  insufficiency  Encounter for power mobility device assessment  Wheelchair dependence  History of malnutrition  Healthcare maintenance  Syncope and collapse  Sepsis (HCC)  Leukocytosis  History of essential hypertension     NON-GU PMH: Personal history of other diseases of the nervous system and sense organs, History of glaucoma - 2014, History of cerebral palsy, - 2014 Glaucoma Hypercholesterolemia Hypertension    FAMILY HISTORY: Kidney Stones - Runs in Family nephrolithiasis - Runs In Family   SOCIAL HISTORY: Marital Status: Single Preferred Language: English; Race: White Current Smoking Status: Patient has never smoked.   Tobacco Use Assessment Completed: Used Tobacco in last 30 days? Has never drank.  Drinks 2 caffeinated drinks per day.     Notes: Never A Smoker, Marital History - Single, Alcohol Use, Physical Disability Affecting Ability To Work   REVIEW OF SYSTEMS:    GU Review Male:   Patient reports frequent urination, hard to postpone urination, and get up at night to urinate. Patient denies burning/ pain with urination, trouble starting your stream, leakage of urine, erection problems, have to strain to urinate , penile pain, and stream starts and stops.  Gastrointestinal (Upper):   Patient denies nausea, vomiting, and indigestion/ heartburn.  Gastrointestinal (Lower):   Patient denies diarrhea and constipation.  Constitutional:   Patient denies fever, night sweats, weight loss, and fatigue.  Skin:   Patient denies skin rash/ lesion and itching.  Eyes:   Patient denies blurred vision and double vision.  Ears/ Nose/ Throat:   Patient denies sore throat and  sinus problems.  Hematologic/Lymphatic:   Patient denies swollen glands and easy bruising.  Cardiovascular:   Patient denies leg swelling and chest pains.  Respiratory:   Patient denies cough and shortness of breath.  Endocrine:   Patient denies excessive thirst.  Musculoskeletal:   Patient denies back pain and joint pain.  Neurological:   Patient denies headaches and dizziness.  Psychologic:   Patient denies depression and anxiety.   VITAL SIGNS:      05/13/2024 03:04 PM  Weight 170 lb / 77.11 kg  Height 69.5 in / 176.53 cm  BP 117/83 mmHg  Pulse 94 /min  Temperature 97.8 F / 36.5 C  BMI 24.7 kg/m   MULTI-SYSTEM PHYSICAL EXAMINATION:    Constitutional: Well-nourished. No physical deformities. Normally developed. Good grooming.   Ears, Nose, Mouth, and Throat: Deaf.   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 Risks included, but are not limited to: bleeding, urinary tract infection, ureteral injury/avulsion, ureteral stricture formation, retained stone fragments, the possibility that multiple surgeries may be required to treat the stone(s), MI, stroke, PE and the inherent risks of general anesthesia. His parents voice  understanding and wish to proceed.

## 2024-05-21 NOTE — Progress Notes (Addendum)
 Anesthesia Chart Review   Case: [id number] Date/Time: 05/22/24 0930   Procedure: CYSTOSCOPY/URETEROSCOPY/HOLMIUM LASER/STENT PLACEMENT (Right) - CYSTOSCOPY/RIGHT URETEROSCOPY/HOLMIUM LASER/STENT PLACEMENT/RETROGRADE PYELOGRAM   Anesthesia type: General   Diagnosis: Calculus of kidney with calculus of ureter [N20.2]   Pre-op diagnosis: RIGHT URETERAL AND RENAL STONES   Location: [facility] PROC[facility]   Surgeons: Devere Lonni Righter, MD       [facility] y.o. never smoker with h/o cerebral palsy, wheelchair dependent pt is able to transfer from wheelchair to bed, deafness, right ureteral and renal stones scheduled for above procedure 05/22/2024 with Dr. Lonni Devere.   Recent admission 05/09/2024-05/11/2024 due to UTI, right nephrolithiasis. Started on IV antibiotics and outpatient intervention planned with urology. Pt discharged on course of antibiotics and Flomax .   Pt had hospitalization follow up with PCP 05/15/24. Per OV note pain resolved. Pt is currently in PT and OT for cerbral palsy following admission, PCP will continue orders when needed.   Pt had issues with dysphagia in 2022, subsequent EGD with esophageal dilation 06/25/2021. Per mother he has not experienced dysphagia since that time, no trouble with swallowing liquids or solids, no aspiration since that time. Per mother he has no breathing issues.  He has a chronic mild cough which mother states is stable, has been contributed to allergies. Pt uses daily Flonase  and takes Singulair .  No cough noted at PAT visit.   Per previous anesthesia note 08/29/2022, 11/2010 eye surgery at [facility]- LMA was used and patient experienced airway/oxygenation problems. Patient had subsequent surgeries after at [facility] and parents were told ET used and patient did fine. Patient had no anesthesia problems with surgeries prior to the 11/2010 surgery. No anesthesia complications noted with 08/29/22 procedure. He has had multiple surgeries since with  no complications noted.   Anesthesia note 08/29/2022:  Final Airway Details Final airway type: endotracheal airway  Endotracheal/SGA details: ETT Cuffed: yes  Successful intubation technique: video laryngoscopy Endotracheal tube insertion site: oral Blade: McGrath Blade size: #4 ETT size (mm): 7.0  Evaluated by cardiology in 2022 for syncopal episode.  Echo normal, no evidence of arrhythmia on event monitor.  No further cardiac workup recommended by Dr. Lavona. Syncopal event vagal episode. Cardio follow up prn.   Pt discussed with Dr. Epifanio.  VS: Ht 5' 9 (1.753 m)   Wt 73 kg   BMI 23.78 kg/m   PROVIDERS: Lennie Raguel MATSU, DO is PCP    LABS: Labs reviewed: Acceptable for surgery. (all labs ordered are listed, but only abnormal results are displayed)  Labs Reviewed - No data to display   IMAGES:   EKG:   CV: Echo 05/28/2021 1. Left ventricular ejection fraction, by estimation, is 60 to 65%. The  left ventricle has normal function. The left ventricle has no regional  wall motion abnormalities. Left ventricular diastolic parameters were  normal. The average left ventricular  global longitudinal strain is -22.3 %. The global longitudinal strain is  normal.   2. Right ventricular systolic function is mildly reduced. The right  ventricular size is normal.   3. The mitral valve is normal in structure. Trivial mitral valve  regurgitation. No evidence of mitral stenosis.   4. The aortic valve is normal in structure. Aortic valve regurgitation is  not visualized.  Past Medical History:  Diagnosis Date   Allergic rhinitis    Allergy    Atrophy, cortical    Lower left ventrical   Cerebral palsy (HCC)    W/C BOUND-ABLE TO TRANSFER  W/C TO BED -DOES REQUIRE SOME HELP IN BATHROOM WITH CLOTHES--LIVES IN GROUP HOME CALLED [name]-HIS PARENTS ARE HIS LEGAL GUARDIANS   Complication of anesthesia    JAN 2012 EYE SURGERY AT [facility] WAS USED AND PT EXPERIENCED  AIRWAY / OXYGENATION PROBLEMS.  PT HAD SUBSEQUENT SURGERIES  AFTER JAN 2012 AT [facility] AND PARENTS TOLD ET USED AND PT DID FINE.  PT HAD NO ANESTHESIA PROBLEMS WITH SURGERIES PRIOR TO THE JAN 2012 SURGERY.   Deafness    hearing aids-NOT WEARING AT PRESENT TIME; ABLE TO DO SIGN LANGUAGE WITH HIS PARENTS   Glaucoma    History of kidney stones    Hyperlipidemia    Kidney stone    Neuromuscular disorder (HCC)    Platelets decreased (HCC)    Prepatellar bursitis    Hospitalized in 2012 for sepsis secondary to bursisiitis   Scoliosis     Past Surgical History:  Procedure Laterality Date   ADENOIDECTOMY     CYSTOSCOPY/RETROGRADE/URETEROSCOPY  10/01/2012   Procedure: CYSTOSCOPY/RETROGRADE/URETEROSCOPY;  Surgeon: Noretta Ferrara, MD;  Location: [facility];  Service: Urology;  Laterality: Left;  laser lithotripsy stone extraction on left   ESOPHAGOGASTRODUODENOSCOPY (EGD) WITH PROPOFOL  N/A 06/25/2021   Procedure: ESOPHAGOGASTRODUODENOSCOPY (EGD) WITH PROPOFOL ;  Surgeon: Rollin Dover, MD;  [facility];  Service: Endoscopy;  Laterality: N/A;   eye surgery  2012   [facility]   EYE SURGERY     4 RIGHT EYE SURGERIES FOR GLAUCOMA AND ONE  RT EYE CATARACT EXTRACTION; LEFT EYE CATARACT EXTRACTION WITH  I STENTPROCEDURE FOR GLAUCOMA   HOLMIUM LASER APPLICATION  10/01/2012   Procedure: HOLMIUM LASER APPLICATION;  Surgeon: Noretta Ferrara, MD;  [facility]: [facility];  Service: Urology;  Laterality: Left;   multiple orthopedic procedures     MYRINGOTOMY     SAVORY DILATION N/A 06/25/2021   Procedure: SAVORY DILATION;  Surgeon: Rollin Dover, MD;  [facility];  Service: Endoscopy;  Laterality: N/A;   SELECTIVE RHIZOTOMY PROCEDURE TO HELP REDUCE SPASTICITY AND CONTRACTURES      MEDICATIONS:  cholecalciferol  (VITAMIN D3) 25 MCG (1000 UNIT) tablet   COMBIGAN  0.2-0.5 % ophthalmic solution   fluticasone  (FLONASE ) 50 MCG/ACT nasal spray   latanoprost  (XALATAN ) 0.005 % ophthalmic solution    losartan -hydrochlorothiazide (HYZAAR) 100-12.5 MG tablet   montelukast  (SINGULAIR ) 10 MG tablet   multivitamin (ONE-A-DAY MEN'S) TABS tablet   polyethylene glycol (MIRALAX ) 17 g packet   psyllium (METAMUCIL SMOOTH TEXTURE) 58.6 % powder   Skin Protectants, Misc. (EUCERIN) cream   tamsulosin  (FLOMAX ) 0.4 MG CAPS capsule   No current facility-administered medications for this encounter.     Harlene Hoots Ward, PA-C [facility] [phone]

## 2024-05-22 ENCOUNTER — Encounter (HOSPITAL_COMMUNITY)
Admission: RE | Disposition: A | Discharge status: Home / Self Care | Payer: Self-pay | Source: Home / Self Care | Attending: Urology

## 2024-05-22 ENCOUNTER — Ambulatory Visit (HOSPITAL_COMMUNITY): Payer: Self-pay | Admitting: Medical

## 2024-05-22 ENCOUNTER — Ambulatory Visit (HOSPITAL_COMMUNITY): Admitting: Anesthesiology

## 2024-05-22 ENCOUNTER — Ambulatory Visit (HOSPITAL_COMMUNITY)
Admission: RE | Admit: 2024-05-22 | Discharge: 2024-05-22 | Disposition: A | Discharge status: Home / Self Care | Attending: Urology | Admitting: Urology

## 2024-05-22 ENCOUNTER — Ambulatory Visit (HOSPITAL_COMMUNITY)

## 2024-05-22 ENCOUNTER — Encounter (HOSPITAL_COMMUNITY): Payer: Self-pay | Admitting: Urology

## 2024-05-22 DIAGNOSIS — N202 Calculus of kidney with calculus of ureter: Secondary | ICD-10-CM

## 2024-05-22 DIAGNOSIS — H9190 Unspecified hearing loss, unspecified ear: Secondary | ICD-10-CM | POA: Diagnosis not present

## 2024-05-22 DIAGNOSIS — D696 Thrombocytopenia, unspecified: Secondary | ICD-10-CM | POA: Diagnosis not present

## 2024-05-22 DIAGNOSIS — G809 Cerebral palsy, unspecified: Secondary | ICD-10-CM | POA: Diagnosis not present

## 2024-05-22 DIAGNOSIS — E785 Hyperlipidemia, unspecified: Secondary | ICD-10-CM

## 2024-05-22 DIAGNOSIS — I10 Essential (primary) hypertension: Secondary | ICD-10-CM | POA: Diagnosis not present

## 2024-05-22 DIAGNOSIS — Z556 Problems related to health literacy: Secondary | ICD-10-CM | POA: Diagnosis not present

## 2024-05-22 DIAGNOSIS — Z79899 Other long term (current) drug therapy: Secondary | ICD-10-CM | POA: Diagnosis not present

## 2024-05-22 DIAGNOSIS — E559 Vitamin D deficiency, unspecified: Secondary | ICD-10-CM | POA: Diagnosis not present

## 2024-05-22 DIAGNOSIS — H409 Unspecified glaucoma: Secondary | ICD-10-CM | POA: Diagnosis not present

## 2024-05-22 DIAGNOSIS — Z993 Dependence on wheelchair: Secondary | ICD-10-CM | POA: Diagnosis not present

## 2024-05-22 HISTORY — PX: CYSTOSCOPY/URETEROSCOPY/HOLMIUM LASER/STENT PLACEMENT: SHX6546

## 2024-05-22 SURGERY — CYSTOSCOPY/URETEROSCOPY/HOLMIUM LASER/STENT PLACEMENT
Anesthesia: General | Laterality: Right

## 2024-05-22 MED ORDER — PROPOFOL 10 MG/ML IV BOLUS
INTRAVENOUS | Status: AC
  Filled 2024-05-22: qty 20

## 2024-05-22 MED ORDER — EPHEDRINE SULFATE-NACL 50-0.9 MG/10ML-% IV SOSY
PREFILLED_SYRINGE | INTRAVENOUS | Status: DC | PRN
Start: 2024-05-22 — End: 2024-05-22
  Administered 2024-05-22 (×2): 5 mg via INTRAVENOUS

## 2024-05-22 MED ORDER — LIDOCAINE 2% (20 MG/ML) 5 ML SYRINGE
INTRAMUSCULAR | Status: DC | PRN
  Administered 2024-05-22: 100 mg via INTRAVENOUS

## 2024-05-22 MED ORDER — PHENYLEPHRINE 80 MCG/ML (10ML) SYRINGE FOR IV PUSH (FOR BLOOD PRESSURE SUPPORT)
PREFILLED_SYRINGE | INTRAVENOUS | Status: DC | PRN
  Administered 2024-05-22 (×2): 160 ug via INTRAVENOUS
  Administered 2024-05-22: 80 ug via INTRAVENOUS
  Administered 2024-05-22 (×2): 160 ug via INTRAVENOUS

## 2024-05-22 MED ORDER — FENTANYL CITRATE (PF) 100 MCG/2ML IJ SOLN
INTRAMUSCULAR | Status: AC
Start: 2024-05-22 — End: 2024-05-22
  Filled 2024-05-22: qty 2

## 2024-05-22 MED ORDER — DEXAMETHASONE SODIUM PHOSPHATE 10 MG/ML IJ SOLN
INTRAMUSCULAR | Status: AC
  Filled 2024-05-22: qty 1

## 2024-05-22 MED ORDER — ONDANSETRON HCL 4 MG/2ML IJ SOLN
INTRAMUSCULAR | Status: AC
Start: 2024-05-22 — End: 2024-05-22
  Filled 2024-05-22: qty 2

## 2024-05-22 MED ORDER — CEFAZOLIN SODIUM-DEXTROSE 2-4 GM/100ML-% IV SOLN
2.0000 g | INTRAVENOUS | Status: AC
  Administered 2024-05-22: 2 g via INTRAVENOUS
  Filled 2024-05-22: qty 100

## 2024-05-22 MED ORDER — LACTATED RINGERS IV SOLN: INTRAVENOUS | Status: DC

## 2024-05-22 MED ORDER — PHENAZOPYRIDINE HCL 200 MG PO TABS: 200.0000 mg | ORAL_TABLET | Freq: Three times a day (TID) | ORAL | 0 refills | Status: AC | PRN

## 2024-05-22 MED ORDER — CHLORHEXIDINE GLUCONATE 0.12 % MT SOLN
15.0000 mL | Freq: Once | OROMUCOSAL | Status: AC
  Administered 2024-05-22: 15 mL via OROMUCOSAL

## 2024-05-22 MED ORDER — PROPOFOL 10 MG/ML IV BOLUS
INTRAVENOUS | Status: DC | PRN
  Administered 2024-05-22: 120 mg via INTRAVENOUS

## 2024-05-22 MED ORDER — DEXAMETHASONE SODIUM PHOSPHATE 10 MG/ML IJ SOLN
INTRAMUSCULAR | Status: DC | PRN
  Administered 2024-05-22: 8 mg via INTRAVENOUS

## 2024-05-22 MED ORDER — MIDAZOLAM HCL 5 MG/5ML IJ SOLN
INTRAMUSCULAR | Status: DC | PRN
  Administered 2024-05-22: 2 mg via INTRAVENOUS

## 2024-05-22 MED ORDER — FENTANYL CITRATE (PF) 100 MCG/2ML IJ SOLN
INTRAMUSCULAR | Status: DC | PRN
  Administered 2024-05-22: 25 ug via INTRAVENOUS
  Administered 2024-05-22: 50 ug via INTRAVENOUS
  Administered 2024-05-22: 25 ug via INTRAVENOUS

## 2024-05-22 MED ORDER — MIDAZOLAM HCL 2 MG/2ML IJ SOLN
INTRAMUSCULAR | Status: AC
  Filled 2024-05-22: qty 2

## 2024-05-22 MED ORDER — FENTANYL CITRATE PF 50 MCG/ML IJ SOSY: 25.0000 ug | PREFILLED_SYRINGE | INTRAMUSCULAR | Status: DC | PRN

## 2024-05-22 MED ORDER — OXYCODONE HCL 5 MG PO TABS: 5.0000 mg | ORAL_TABLET | Freq: Once | ORAL | Status: DC | PRN

## 2024-05-22 MED ORDER — ORAL CARE MOUTH RINSE
15.0000 mL | Freq: Once | OROMUCOSAL | Status: AC
Start: 2024-05-22 — End: 2024-05-22

## 2024-05-22 MED ORDER — TRAMADOL HCL 50 MG PO TABS: 50.0000 mg | ORAL_TABLET | Freq: Four times a day (QID) | ORAL | 0 refills | Status: AC | PRN

## 2024-05-22 MED ORDER — LIDOCAINE HCL (PF) 2 % IJ SOLN
INTRAMUSCULAR | Status: AC
  Filled 2024-05-22: qty 5

## 2024-05-22 MED ORDER — SODIUM CHLORIDE 0.9 % IR SOLN
Status: DC | PRN
  Administered 2024-05-22: 3000 mL via INTRAVESICAL

## 2024-05-22 MED ORDER — ONDANSETRON HCL 4 MG/2ML IJ SOLN
INTRAMUSCULAR | Status: DC | PRN
  Administered 2024-05-22: 4 mg via INTRAVENOUS

## 2024-05-22 MED ORDER — IOHEXOL 300 MG/ML  SOLN
INTRAMUSCULAR | Status: DC | PRN
  Administered 2024-05-22: 6 mL via URETHRAL

## 2024-05-22 MED ORDER — OXYCODONE HCL 5 MG/5ML PO SOLN: 5.0000 mg | Freq: Once | ORAL | Status: DC | PRN

## 2024-05-22 MED ORDER — EPHEDRINE 5 MG/ML INJ
INTRAVENOUS | Status: AC
  Filled 2024-05-22: qty 5

## 2024-05-22 SURGICAL SUPPLY — 18 items
BAG URO CATCHER STRL LF (MISCELLANEOUS) ×1 IMPLANT
BASKET ZERO TIP NITINOL 2.4FR (BASKET) IMPLANT
CATH URETL OPEN 5X70 (CATHETERS) ×1 IMPLANT
CLOTH BEACON ORANGE TIMEOUT ST (SAFETY) ×1 IMPLANT
EXTRACTOR STONE NITINOL NGAGE (UROLOGICAL SUPPLIES) IMPLANT
FIBER LASER MOSES 200 DFL (Laser) IMPLANT
GLOVE SURG LX STRL 8.0 MICRO (GLOVE) ×1 IMPLANT
GOWN STRL SURGICAL XL XLNG (GOWN DISPOSABLE) ×1 IMPLANT
GUIDEWIRE STR DUAL SENSOR (WIRE) IMPLANT
GUIDEWIRE ZIPWRE .038 STRAIGHT (WIRE) ×1 IMPLANT
KIT TURNOVER KIT A (KITS) ×1 IMPLANT
MANIFOLD NEPTUNE II (INSTRUMENTS) ×1 IMPLANT
PACK CYSTO (CUSTOM PROCEDURE TRAY) ×1 IMPLANT
SHEATH NAVIGATOR HD 11/13X36 (SHEATH) IMPLANT
STENT URET 6FRX24 CONTOUR (STENTS) IMPLANT
STENT URET 6FRX26 CONTOUR (STENTS) IMPLANT
TUBING CONNECTING 10 (TUBING) ×1 IMPLANT
TUBING UROLOGY SET (TUBING) ×1 IMPLANT

## 2024-05-22 NOTE — Transfer of Care (Signed)
 Immediate Anesthesia Transfer of Care Note  Patient: Edward Alvarado  Procedure(s) Performed: CYSTOSCOPY/URETEROSCOPY/HOLMIUM LASER/STENT PLACEMENT/RIGHT RETROGRADE (Right)  Patient Location: PACU  Anesthesia Type:General  Level of Consciousness: awake, alert , and oriented  Airway & Oxygen  Therapy: Patient Spontanous Breathing and Patient connected to face mask oxygen   Post-op Assessment: Report given to RN and Post -op Vital signs reviewed and stable  Post vital signs: Reviewed and stable  Last Vitals:  Vitals Value Taken Time  BP    Temp    Pulse    Resp    SpO2      Last Pain:  Vitals:   05/22/24 0816  TempSrc:   PainSc: 0-No pain         Complications: No notable events documented.

## 2024-05-22 NOTE — Anesthesia Postprocedure Evaluation (Signed)
 Anesthesia Post Note  Patient: NAITHEN RIVENBURG  Procedure(s) Performed: CYSTOSCOPY/URETEROSCOPY/HOLMIUM LASER/STENT PLACEMENT/RIGHT RETROGRADE (Right)     Patient location during evaluation: PACU Anesthesia Type: General Level of consciousness: awake and alert Pain management: pain level controlled Vital Signs Assessment: post-procedure vital signs reviewed and stable Respiratory status: spontaneous breathing, nonlabored ventilation and respiratory function stable Cardiovascular status: blood pressure returned to baseline Postop Assessment: no apparent nausea or vomiting Anesthetic complications: no   No notable events documented.  Last Vitals:  Vitals:   05/22/24 1200 05/22/24 1201  BP: (!) 122/93 (!) 122/93  Pulse: 90 91  Resp:    Temp:  37.1 C  SpO2: 98% 96%    Last Pain:  Vitals:   05/22/24 1201  TempSrc: (P) Oral  PainSc:                  Vertell Row

## 2024-05-22 NOTE — Anesthesia Procedure Notes (Signed)
 Procedure Name: LMA Insertion Date/Time: 05/22/2024 9:53 AM  Performed by: Tatiana Courter D, CRNAPre-anesthesia Checklist: Patient identified, Emergency Drugs available, Suction available and Patient being monitored Patient Re-evaluated:Patient Re-evaluated prior to induction Oxygen  Delivery Method: Circle system utilized Preoxygenation: Pre-oxygenation with 100% oxygen  Induction Type: IV induction Ventilation: Mask ventilation without difficulty LMA: LMA inserted LMA Size: 4.0 Tube type: Oral Number of attempts: 1 Placement Confirmation: positive ETCO2 and breath sounds checked- equal and bilateral Tube secured with: Tape Dental Injury: Teeth and Oropharynx as per pre-operative assessment

## 2024-05-22 NOTE — Op Note (Signed)
 Operative Note  Preoperative diagnosis:  1.  6 mm right ureteral stone 2.  8 mm right renal stone  Postoperative diagnosis: Same  Procedure(s): 1.  Cystoscopy with right ureteroscopy, holmium laser lithotripsy and right ureteral stent placement 2.  Right retrograde pyelogram with intraoperative interpretation fluoroscopic imaging  Surgeon: Lonni Han, MD  Assistants:  None  Anesthesia:  General  Complications:  None  EBL: Less than 5 mL  Specimens: 1.  Right ureteral stone fragments  Drains/Catheters: 1.  Right 6 French, 24 cm JJ stent without tether  Intraoperative findings:   No intravesical or urethral abnormalities were seen during cystoscopy. Right retrograde pyelogram revealed a filling defect within the midportion of the right ureter, consistent with the stone seen on recent CT.  There were no obvious filling defects seen within the proximal aspects of the right ureter or within the right renal pelvis.  Indication:  Edward Alvarado is a 43 y.o. male with a nonobstructing 6 mm right ureteral stone as well as an 8 mm right renal stone.  He and his parents have been consented for the above procedures, voices understanding and wishes to proceed.  Description of procedure:  After informed consent was obtained, the patient was brought to the operating room and general LMA anesthesia was administered. The patient was then placed in the dorsolithotomy position and prepped and draped in the usual sterile fashion. A timeout was performed. A 21 French rigid cystoscope was then inserted into the urethral meatus and advanced into the bladder under direct vision. A complete bladder survey revealed no intravesical pathology.  A 5 French ureteral catheter was then inserted into the right ureteral orifice and a retrograde pyelogram was obtained, with the findings listed above.  A Glidewire was then used to intubate the lumen of the ureteral catheter and was advanced up to the  right renal pelvis, under fluoroscopic guidance.  The catheter was then removed, leaving the wire in place.  A semirigid ureteroscope was then advanced into the bladder and up the right ureter where his 6 mm stone was seen in the midportion of the right ureter.  A 200 m holmium laser was then used to fracture the stone into several smaller pieces.  A 0 tip basket was then used to extract all stone fragments from the lumen of the right ureter.  The rigid ureteroscope was then exchanged for a flexible ureteroscope, which was then advanced into the bladder and up the right ureter and into the renal pelvis where his 8 mm stone was identified.  The 200 m holmium laser was then used to dust the right renal stone into less than 2 mm fragments.  The flexible ureteroscope was then removed under direct vision, leaving the wire in place.  A 6 French, 24 cm JJ stent was then placed over the Glidewire and into good position within the right collecting system, confirming placement via fluoroscopy.  The patient's bladder was drained.  He tolerated the procedure well and was transferred to the postanesthesia in stable condition.  Plan: Follow-up in 1 week for office cystoscopy and stent removal

## 2024-05-22 NOTE — Progress Notes (Signed)
   05/22/24 0849  Language Assistant  Interpreter Mode Patient Declined [place] Interpreter  Patient Signed  Waiver Yes  Interpreter Name Mother (and legal guardian) - Erminio  Interpreter Phone Number - If applicable Erminio - [phone]  Language & Interpreter (ONLY document if update needed)  Needs Interpreter Y  Preferred Language sgn   Patient has signed waiver in chart for mother Erminio to provide interpretive services.

## 2024-05-23 ENCOUNTER — Encounter (HOSPITAL_COMMUNITY): Payer: Self-pay | Admitting: Urology

## 2024-05-23 ENCOUNTER — Telehealth: Payer: Self-pay

## 2024-05-23 NOTE — Telephone Encounter (Signed)
 Received call from [facility] Clinical Manager regarding patient.   Requesting verbal orders to resume care post hospitalization.   Verbal orders provided per protocol.   [name], RN

## 2024-05-27 DIAGNOSIS — G809 Cerebral palsy, unspecified: Secondary | ICD-10-CM | POA: Diagnosis not present

## 2024-05-27 DIAGNOSIS — H9190 Unspecified hearing loss, unspecified ear: Secondary | ICD-10-CM | POA: Diagnosis not present

## 2024-05-27 DIAGNOSIS — D696 Thrombocytopenia, unspecified: Secondary | ICD-10-CM | POA: Diagnosis not present

## 2024-05-27 DIAGNOSIS — H409 Unspecified glaucoma: Secondary | ICD-10-CM | POA: Diagnosis not present

## 2024-05-27 DIAGNOSIS — E785 Hyperlipidemia, unspecified: Secondary | ICD-10-CM | POA: Diagnosis not present

## 2024-05-27 DIAGNOSIS — I10 Essential (primary) hypertension: Secondary | ICD-10-CM | POA: Diagnosis not present

## 2024-05-28 ENCOUNTER — Telehealth: Payer: Self-pay

## 2024-05-28 DIAGNOSIS — G809 Cerebral palsy, unspecified: Secondary | ICD-10-CM

## 2024-05-28 NOTE — Telephone Encounter (Signed)
 Edward Alvarado from [employer] calling for PT verbal orders as follows:  1 time(s) weekly for 4 week(s).   Verbal orders given per [facility] protocol  She is also requesting referral to [facility] for orthotics evaluation. Will forward to PCP.   [name], RN

## 2024-05-30 ENCOUNTER — Telehealth: Payer: Self-pay

## 2024-05-30 DIAGNOSIS — H409 Unspecified glaucoma: Secondary | ICD-10-CM | POA: Diagnosis not present

## 2024-05-30 DIAGNOSIS — D696 Thrombocytopenia, unspecified: Secondary | ICD-10-CM | POA: Diagnosis not present

## 2024-05-30 DIAGNOSIS — I10 Essential (primary) hypertension: Secondary | ICD-10-CM | POA: Diagnosis not present

## 2024-05-30 DIAGNOSIS — G809 Cerebral palsy, unspecified: Secondary | ICD-10-CM | POA: Diagnosis not present

## 2024-05-30 DIAGNOSIS — H9190 Unspecified hearing loss, unspecified ear: Secondary | ICD-10-CM | POA: Diagnosis not present

## 2024-05-30 DIAGNOSIS — E785 Hyperlipidemia, unspecified: Secondary | ICD-10-CM | POA: Diagnosis not present

## 2024-05-30 DIAGNOSIS — N202 Calculus of kidney with calculus of ureter: Secondary | ICD-10-CM | POA: Diagnosis not present

## 2024-05-30 NOTE — Telephone Encounter (Signed)
 Darice from [employer] calling for OT verbal orders as follows:  1 time(s) weekly for 8 week(s)  Verbal orders given per [facility] protocol  [name], RN

## 2024-05-31 NOTE — Telephone Encounter (Signed)
 Called [facility] for further information.   They are requesting that order and demographics be faxed to their office at [phone].  Will forward to provider for order placement. Order has been pended to this encounter.   [name], RN

## 2024-06-03 DIAGNOSIS — E785 Hyperlipidemia, unspecified: Secondary | ICD-10-CM | POA: Diagnosis not present

## 2024-06-03 DIAGNOSIS — D696 Thrombocytopenia, unspecified: Secondary | ICD-10-CM | POA: Diagnosis not present

## 2024-06-03 DIAGNOSIS — I10 Essential (primary) hypertension: Secondary | ICD-10-CM | POA: Diagnosis not present

## 2024-06-03 DIAGNOSIS — G809 Cerebral palsy, unspecified: Secondary | ICD-10-CM | POA: Diagnosis not present

## 2024-06-03 DIAGNOSIS — H409 Unspecified glaucoma: Secondary | ICD-10-CM | POA: Diagnosis not present

## 2024-06-03 DIAGNOSIS — H9190 Unspecified hearing loss, unspecified ear: Secondary | ICD-10-CM | POA: Diagnosis not present

## 2024-06-04 NOTE — Telephone Encounter (Signed)
 Printed order and demographics.   Faxed to [facility] at [phone].  [name], RN

## 2024-06-05 DIAGNOSIS — E785 Hyperlipidemia, unspecified: Secondary | ICD-10-CM | POA: Diagnosis not present

## 2024-06-05 DIAGNOSIS — G809 Cerebral palsy, unspecified: Secondary | ICD-10-CM | POA: Diagnosis not present

## 2024-06-05 DIAGNOSIS — H9190 Unspecified hearing loss, unspecified ear: Secondary | ICD-10-CM | POA: Diagnosis not present

## 2024-06-05 DIAGNOSIS — I10 Essential (primary) hypertension: Secondary | ICD-10-CM | POA: Diagnosis not present

## 2024-06-05 DIAGNOSIS — H409 Unspecified glaucoma: Secondary | ICD-10-CM | POA: Diagnosis not present

## 2024-06-05 DIAGNOSIS — D696 Thrombocytopenia, unspecified: Secondary | ICD-10-CM | POA: Diagnosis not present

## 2024-06-07 DIAGNOSIS — H35351 Cystoid macular degeneration, right eye: Secondary | ICD-10-CM | POA: Diagnosis not present

## 2024-06-07 DIAGNOSIS — H31092 Other chorioretinal scars, left eye: Secondary | ICD-10-CM | POA: Diagnosis not present

## 2024-06-10 DIAGNOSIS — H409 Unspecified glaucoma: Secondary | ICD-10-CM | POA: Diagnosis not present

## 2024-06-10 DIAGNOSIS — H9190 Unspecified hearing loss, unspecified ear: Secondary | ICD-10-CM | POA: Diagnosis not present

## 2024-06-10 DIAGNOSIS — E785 Hyperlipidemia, unspecified: Secondary | ICD-10-CM | POA: Diagnosis not present

## 2024-06-10 DIAGNOSIS — D696 Thrombocytopenia, unspecified: Secondary | ICD-10-CM | POA: Diagnosis not present

## 2024-06-10 DIAGNOSIS — I10 Essential (primary) hypertension: Secondary | ICD-10-CM | POA: Diagnosis not present

## 2024-06-10 DIAGNOSIS — G809 Cerebral palsy, unspecified: Secondary | ICD-10-CM | POA: Diagnosis not present

## 2024-06-11 DIAGNOSIS — I10 Essential (primary) hypertension: Secondary | ICD-10-CM | POA: Diagnosis not present

## 2024-06-11 DIAGNOSIS — E785 Hyperlipidemia, unspecified: Secondary | ICD-10-CM | POA: Diagnosis not present

## 2024-06-11 DIAGNOSIS — H9190 Unspecified hearing loss, unspecified ear: Secondary | ICD-10-CM | POA: Diagnosis not present

## 2024-06-11 DIAGNOSIS — H409 Unspecified glaucoma: Secondary | ICD-10-CM | POA: Diagnosis not present

## 2024-06-11 DIAGNOSIS — G809 Cerebral palsy, unspecified: Secondary | ICD-10-CM | POA: Diagnosis not present

## 2024-06-11 DIAGNOSIS — D696 Thrombocytopenia, unspecified: Secondary | ICD-10-CM | POA: Diagnosis not present

## 2024-06-18 ENCOUNTER — Telehealth: Payer: Self-pay

## 2024-06-18 DIAGNOSIS — I10 Essential (primary) hypertension: Secondary | ICD-10-CM | POA: Diagnosis not present

## 2024-06-18 DIAGNOSIS — G809 Cerebral palsy, unspecified: Secondary | ICD-10-CM | POA: Diagnosis not present

## 2024-06-18 DIAGNOSIS — H409 Unspecified glaucoma: Secondary | ICD-10-CM | POA: Diagnosis not present

## 2024-06-18 DIAGNOSIS — E785 Hyperlipidemia, unspecified: Secondary | ICD-10-CM | POA: Diagnosis not present

## 2024-06-18 DIAGNOSIS — H9190 Unspecified hearing loss, unspecified ear: Secondary | ICD-10-CM | POA: Diagnosis not present

## 2024-06-18 DIAGNOSIS — D696 Thrombocytopenia, unspecified: Secondary | ICD-10-CM | POA: Diagnosis not present

## 2024-06-18 NOTE — Telephone Encounter (Signed)
 Jennifer from [employer] calling for PT verbal orders as follows:  1 time(s) weekly for 8 week(s).  Verbal orders given per [facility] protocol  [name], RN

## 2024-06-19 ENCOUNTER — Telehealth: Payer: Self-pay

## 2024-06-19 DIAGNOSIS — D696 Thrombocytopenia, unspecified: Secondary | ICD-10-CM | POA: Diagnosis not present

## 2024-06-19 DIAGNOSIS — E785 Hyperlipidemia, unspecified: Secondary | ICD-10-CM | POA: Diagnosis not present

## 2024-06-19 DIAGNOSIS — H9190 Unspecified hearing loss, unspecified ear: Secondary | ICD-10-CM | POA: Diagnosis not present

## 2024-06-19 DIAGNOSIS — H409 Unspecified glaucoma: Secondary | ICD-10-CM | POA: Diagnosis not present

## 2024-06-19 DIAGNOSIS — G809 Cerebral palsy, unspecified: Secondary | ICD-10-CM | POA: Diagnosis not present

## 2024-06-19 DIAGNOSIS — I10 Essential (primary) hypertension: Secondary | ICD-10-CM | POA: Diagnosis not present

## 2024-06-19 NOTE — Telephone Encounter (Signed)
 Darice with [facility] calls nurse line requesting verbal orders for Edward Alvarado OT as follows.   1x a week for 8 weeks   Verbal order given per [facility] protocol.

## 2024-06-21 DIAGNOSIS — E559 Vitamin D deficiency, unspecified: Secondary | ICD-10-CM | POA: Diagnosis not present

## 2024-06-21 DIAGNOSIS — Z79899 Other long term (current) drug therapy: Secondary | ICD-10-CM | POA: Diagnosis not present

## 2024-06-21 DIAGNOSIS — Z48816 Encounter for surgical aftercare following surgery on the genitourinary system: Secondary | ICD-10-CM | POA: Diagnosis not present

## 2024-06-21 DIAGNOSIS — N3090 Cystitis, unspecified without hematuria: Secondary | ICD-10-CM | POA: Diagnosis not present

## 2024-06-21 DIAGNOSIS — D696 Thrombocytopenia, unspecified: Secondary | ICD-10-CM | POA: Diagnosis not present

## 2024-06-21 DIAGNOSIS — I10 Essential (primary) hypertension: Secondary | ICD-10-CM | POA: Diagnosis not present

## 2024-06-21 DIAGNOSIS — H409 Unspecified glaucoma: Secondary | ICD-10-CM | POA: Diagnosis not present

## 2024-06-21 DIAGNOSIS — H9190 Unspecified hearing loss, unspecified ear: Secondary | ICD-10-CM | POA: Diagnosis not present

## 2024-06-21 DIAGNOSIS — B961 Klebsiella pneumoniae [K. pneumoniae] as the cause of diseases classified elsewhere: Secondary | ICD-10-CM | POA: Diagnosis not present

## 2024-06-21 DIAGNOSIS — M419 Scoliosis, unspecified: Secondary | ICD-10-CM | POA: Diagnosis not present

## 2024-06-21 DIAGNOSIS — Z556 Problems related to health literacy: Secondary | ICD-10-CM | POA: Diagnosis not present

## 2024-06-21 DIAGNOSIS — Z993 Dependence on wheelchair: Secondary | ICD-10-CM | POA: Diagnosis not present

## 2024-06-21 DIAGNOSIS — J309 Allergic rhinitis, unspecified: Secondary | ICD-10-CM | POA: Diagnosis not present

## 2024-06-21 DIAGNOSIS — E785 Hyperlipidemia, unspecified: Secondary | ICD-10-CM | POA: Diagnosis not present

## 2024-06-21 DIAGNOSIS — G809 Cerebral palsy, unspecified: Secondary | ICD-10-CM | POA: Diagnosis not present

## 2024-06-25 ENCOUNTER — Other Ambulatory Visit: Payer: Self-pay

## 2024-06-25 DIAGNOSIS — E785 Hyperlipidemia, unspecified: Secondary | ICD-10-CM

## 2024-06-25 DIAGNOSIS — N3090 Cystitis, unspecified without hematuria: Secondary | ICD-10-CM | POA: Diagnosis not present

## 2024-06-25 DIAGNOSIS — I10 Essential (primary) hypertension: Secondary | ICD-10-CM | POA: Diagnosis not present

## 2024-06-25 DIAGNOSIS — Z48816 Encounter for surgical aftercare following surgery on the genitourinary system: Secondary | ICD-10-CM | POA: Diagnosis not present

## 2024-06-25 DIAGNOSIS — D696 Thrombocytopenia, unspecified: Secondary | ICD-10-CM | POA: Diagnosis not present

## 2024-06-25 DIAGNOSIS — J309 Allergic rhinitis, unspecified: Secondary | ICD-10-CM | POA: Diagnosis not present

## 2024-06-25 DIAGNOSIS — B961 Klebsiella pneumoniae [K. pneumoniae] as the cause of diseases classified elsewhere: Secondary | ICD-10-CM | POA: Diagnosis not present

## 2024-06-26 MED ORDER — MONTELUKAST SODIUM 10 MG PO TABS: 10.0000 mg | ORAL_TABLET | Freq: Every day | ORAL | 0 refills | Status: DC

## 2024-06-26 MED ORDER — ONE-A-DAY MENS PO TABS: 1.0000 | ORAL_TABLET | Freq: Every day | ORAL | 11 refills | Status: DC

## 2024-06-28 DIAGNOSIS — J309 Allergic rhinitis, unspecified: Secondary | ICD-10-CM | POA: Diagnosis not present

## 2024-06-28 DIAGNOSIS — D696 Thrombocytopenia, unspecified: Secondary | ICD-10-CM | POA: Diagnosis not present

## 2024-06-28 DIAGNOSIS — I10 Essential (primary) hypertension: Secondary | ICD-10-CM | POA: Diagnosis not present

## 2024-06-28 DIAGNOSIS — B961 Klebsiella pneumoniae [K. pneumoniae] as the cause of diseases classified elsewhere: Secondary | ICD-10-CM | POA: Diagnosis not present

## 2024-06-28 DIAGNOSIS — N3090 Cystitis, unspecified without hematuria: Secondary | ICD-10-CM | POA: Diagnosis not present

## 2024-06-28 DIAGNOSIS — Z48816 Encounter for surgical aftercare following surgery on the genitourinary system: Secondary | ICD-10-CM | POA: Diagnosis not present

## 2024-07-02 DIAGNOSIS — J309 Allergic rhinitis, unspecified: Secondary | ICD-10-CM | POA: Diagnosis not present

## 2024-07-02 DIAGNOSIS — Z48816 Encounter for surgical aftercare following surgery on the genitourinary system: Secondary | ICD-10-CM | POA: Diagnosis not present

## 2024-07-02 DIAGNOSIS — B961 Klebsiella pneumoniae [K. pneumoniae] as the cause of diseases classified elsewhere: Secondary | ICD-10-CM | POA: Diagnosis not present

## 2024-07-02 DIAGNOSIS — I10 Essential (primary) hypertension: Secondary | ICD-10-CM | POA: Diagnosis not present

## 2024-07-02 DIAGNOSIS — D696 Thrombocytopenia, unspecified: Secondary | ICD-10-CM | POA: Diagnosis not present

## 2024-07-02 DIAGNOSIS — N3090 Cystitis, unspecified without hematuria: Secondary | ICD-10-CM | POA: Diagnosis not present

## 2024-07-03 ENCOUNTER — Other Ambulatory Visit: Payer: Self-pay

## 2024-07-03 DIAGNOSIS — Z48816 Encounter for surgical aftercare following surgery on the genitourinary system: Secondary | ICD-10-CM | POA: Diagnosis not present

## 2024-07-03 DIAGNOSIS — I10 Essential (primary) hypertension: Secondary | ICD-10-CM

## 2024-07-03 DIAGNOSIS — J309 Allergic rhinitis, unspecified: Secondary | ICD-10-CM | POA: Diagnosis not present

## 2024-07-03 DIAGNOSIS — D696 Thrombocytopenia, unspecified: Secondary | ICD-10-CM | POA: Diagnosis not present

## 2024-07-03 DIAGNOSIS — N3090 Cystitis, unspecified without hematuria: Secondary | ICD-10-CM | POA: Diagnosis not present

## 2024-07-03 DIAGNOSIS — B961 Klebsiella pneumoniae [K. pneumoniae] as the cause of diseases classified elsewhere: Secondary | ICD-10-CM | POA: Diagnosis not present

## 2024-07-04 MED ORDER — LOSARTAN POTASSIUM-HCTZ 100-12.5 MG PO TABS: 1.0000 | ORAL_TABLET | Freq: Every day | ORAL | 3 refills | Status: DC

## 2024-07-09 DIAGNOSIS — Z48816 Encounter for surgical aftercare following surgery on the genitourinary system: Secondary | ICD-10-CM | POA: Diagnosis not present

## 2024-07-09 DIAGNOSIS — J309 Allergic rhinitis, unspecified: Secondary | ICD-10-CM | POA: Diagnosis not present

## 2024-07-09 DIAGNOSIS — D696 Thrombocytopenia, unspecified: Secondary | ICD-10-CM | POA: Diagnosis not present

## 2024-07-09 DIAGNOSIS — I10 Essential (primary) hypertension: Secondary | ICD-10-CM | POA: Diagnosis not present

## 2024-07-09 DIAGNOSIS — B961 Klebsiella pneumoniae [K. pneumoniae] as the cause of diseases classified elsewhere: Secondary | ICD-10-CM | POA: Diagnosis not present

## 2024-07-09 DIAGNOSIS — N3090 Cystitis, unspecified without hematuria: Secondary | ICD-10-CM | POA: Diagnosis not present

## 2024-07-10 ENCOUNTER — Telehealth: Payer: Self-pay

## 2024-07-10 DIAGNOSIS — J309 Allergic rhinitis, unspecified: Secondary | ICD-10-CM | POA: Diagnosis not present

## 2024-07-10 DIAGNOSIS — I10 Essential (primary) hypertension: Secondary | ICD-10-CM | POA: Diagnosis not present

## 2024-07-10 DIAGNOSIS — D696 Thrombocytopenia, unspecified: Secondary | ICD-10-CM | POA: Diagnosis not present

## 2024-07-10 DIAGNOSIS — E785 Hyperlipidemia, unspecified: Secondary | ICD-10-CM

## 2024-07-10 DIAGNOSIS — Z48816 Encounter for surgical aftercare following surgery on the genitourinary system: Secondary | ICD-10-CM | POA: Diagnosis not present

## 2024-07-10 DIAGNOSIS — N3090 Cystitis, unspecified without hematuria: Secondary | ICD-10-CM | POA: Diagnosis not present

## 2024-07-10 DIAGNOSIS — B961 Klebsiella pneumoniae [K. pneumoniae] as the cause of diseases classified elsewhere: Secondary | ICD-10-CM | POA: Diagnosis not present

## 2024-07-10 MED ORDER — MONTELUKAST SODIUM 10 MG PO TABS
10.0000 mg | ORAL_TABLET | Freq: Every day | ORAL | 0 refills | Status: DC
Start: 2024-07-10 — End: 2024-09-12

## 2024-07-10 MED ORDER — ONE-A-DAY MENS PO TABS: 1.0000 | ORAL_TABLET | Freq: Every day | ORAL | 11 refills | Status: AC

## 2024-07-10 NOTE — Telephone Encounter (Signed)
 Mother calls nurse line in regards to DME order for [facility].   See previous telephone note in July. Order and demographics were faxed.   However, mother reports they never received the order.   Order and Demographics refaxed to [facility].

## 2024-07-10 NOTE — Telephone Encounter (Signed)
 [facility] calls nurse line requesting (2) prescription refills.   Singulair  and Multivitamin. Per chart review, the patient is using [facility] and on 8/13 both requested prescriptions were sent there.   She reports they have switched pharmacies and these were the only two prescriptions that could not be transferred.   Confirmed with mother pharmacy has changed.   Resent Singulair  and Multivitamin to [facility].

## 2024-07-10 NOTE — Telephone Encounter (Signed)
 Parent LVM asking about referral for Orthotics evaluation- [facility].

## 2024-07-11 ENCOUNTER — Other Ambulatory Visit: Payer: Self-pay

## 2024-07-11 DIAGNOSIS — R0982 Postnasal drip: Secondary | ICD-10-CM

## 2024-07-11 MED ORDER — FLUTICASONE PROPIONATE 50 MCG/ACT NA SUSP
2.0000 | Freq: Every day | NASAL | 11 refills | Status: AC
Start: 2024-07-11 — End: ?

## 2024-07-16 DIAGNOSIS — H35351 Cystoid macular degeneration, right eye: Secondary | ICD-10-CM | POA: Diagnosis not present

## 2024-07-16 DIAGNOSIS — H401332 Pigmentary glaucoma, bilateral, moderate stage: Secondary | ICD-10-CM | POA: Diagnosis not present

## 2024-07-17 DIAGNOSIS — N202 Calculus of kidney with calculus of ureter: Secondary | ICD-10-CM | POA: Diagnosis not present

## 2024-07-18 DIAGNOSIS — I10 Essential (primary) hypertension: Secondary | ICD-10-CM | POA: Diagnosis not present

## 2024-07-18 DIAGNOSIS — D696 Thrombocytopenia, unspecified: Secondary | ICD-10-CM | POA: Diagnosis not present

## 2024-07-18 DIAGNOSIS — J309 Allergic rhinitis, unspecified: Secondary | ICD-10-CM | POA: Diagnosis not present

## 2024-07-18 DIAGNOSIS — Z48816 Encounter for surgical aftercare following surgery on the genitourinary system: Secondary | ICD-10-CM | POA: Diagnosis not present

## 2024-07-18 DIAGNOSIS — N3090 Cystitis, unspecified without hematuria: Secondary | ICD-10-CM | POA: Diagnosis not present

## 2024-07-18 DIAGNOSIS — B961 Klebsiella pneumoniae [K. pneumoniae] as the cause of diseases classified elsewhere: Secondary | ICD-10-CM | POA: Diagnosis not present

## 2024-07-21 DIAGNOSIS — Z79899 Other long term (current) drug therapy: Secondary | ICD-10-CM | POA: Diagnosis not present

## 2024-07-21 DIAGNOSIS — E785 Hyperlipidemia, unspecified: Secondary | ICD-10-CM | POA: Diagnosis not present

## 2024-07-21 DIAGNOSIS — Z556 Problems related to health literacy: Secondary | ICD-10-CM | POA: Diagnosis not present

## 2024-07-21 DIAGNOSIS — I10 Essential (primary) hypertension: Secondary | ICD-10-CM | POA: Diagnosis not present

## 2024-07-21 DIAGNOSIS — H9190 Unspecified hearing loss, unspecified ear: Secondary | ICD-10-CM | POA: Diagnosis not present

## 2024-07-21 DIAGNOSIS — Z993 Dependence on wheelchair: Secondary | ICD-10-CM | POA: Diagnosis not present

## 2024-07-21 DIAGNOSIS — H409 Unspecified glaucoma: Secondary | ICD-10-CM | POA: Diagnosis not present

## 2024-07-21 DIAGNOSIS — E559 Vitamin D deficiency, unspecified: Secondary | ICD-10-CM | POA: Diagnosis not present

## 2024-07-21 DIAGNOSIS — J309 Allergic rhinitis, unspecified: Secondary | ICD-10-CM | POA: Diagnosis not present

## 2024-07-21 DIAGNOSIS — M419 Scoliosis, unspecified: Secondary | ICD-10-CM | POA: Diagnosis not present

## 2024-07-21 DIAGNOSIS — N3090 Cystitis, unspecified without hematuria: Secondary | ICD-10-CM | POA: Diagnosis not present

## 2024-07-21 DIAGNOSIS — G809 Cerebral palsy, unspecified: Secondary | ICD-10-CM | POA: Diagnosis not present

## 2024-07-21 DIAGNOSIS — D696 Thrombocytopenia, unspecified: Secondary | ICD-10-CM | POA: Diagnosis not present

## 2024-07-21 DIAGNOSIS — B961 Klebsiella pneumoniae [K. pneumoniae] as the cause of diseases classified elsewhere: Secondary | ICD-10-CM | POA: Diagnosis not present

## 2024-07-21 DIAGNOSIS — Z48816 Encounter for surgical aftercare following surgery on the genitourinary system: Secondary | ICD-10-CM | POA: Diagnosis not present

## 2024-07-23 DIAGNOSIS — N3090 Cystitis, unspecified without hematuria: Secondary | ICD-10-CM | POA: Diagnosis not present

## 2024-07-23 DIAGNOSIS — Z48816 Encounter for surgical aftercare following surgery on the genitourinary system: Secondary | ICD-10-CM | POA: Diagnosis not present

## 2024-07-23 DIAGNOSIS — B961 Klebsiella pneumoniae [K. pneumoniae] as the cause of diseases classified elsewhere: Secondary | ICD-10-CM | POA: Diagnosis not present

## 2024-07-23 DIAGNOSIS — D696 Thrombocytopenia, unspecified: Secondary | ICD-10-CM | POA: Diagnosis not present

## 2024-07-23 DIAGNOSIS — I10 Essential (primary) hypertension: Secondary | ICD-10-CM | POA: Diagnosis not present

## 2024-07-23 DIAGNOSIS — J309 Allergic rhinitis, unspecified: Secondary | ICD-10-CM | POA: Diagnosis not present

## 2024-07-24 DIAGNOSIS — Z48816 Encounter for surgical aftercare following surgery on the genitourinary system: Secondary | ICD-10-CM | POA: Diagnosis not present

## 2024-07-24 DIAGNOSIS — N3090 Cystitis, unspecified without hematuria: Secondary | ICD-10-CM | POA: Diagnosis not present

## 2024-07-24 DIAGNOSIS — I10 Essential (primary) hypertension: Secondary | ICD-10-CM | POA: Diagnosis not present

## 2024-07-24 DIAGNOSIS — D696 Thrombocytopenia, unspecified: Secondary | ICD-10-CM | POA: Diagnosis not present

## 2024-07-24 DIAGNOSIS — B961 Klebsiella pneumoniae [K. pneumoniae] as the cause of diseases classified elsewhere: Secondary | ICD-10-CM | POA: Diagnosis not present

## 2024-07-24 DIAGNOSIS — J309 Allergic rhinitis, unspecified: Secondary | ICD-10-CM | POA: Diagnosis not present

## 2024-07-26 DIAGNOSIS — R32 Unspecified urinary incontinence: Secondary | ICD-10-CM | POA: Diagnosis not present

## 2024-07-26 DIAGNOSIS — R350 Frequency of micturition: Secondary | ICD-10-CM | POA: Diagnosis not present

## 2024-07-26 DIAGNOSIS — Z87442 Personal history of urinary calculi: Secondary | ICD-10-CM | POA: Diagnosis not present

## 2024-07-30 DIAGNOSIS — J309 Allergic rhinitis, unspecified: Secondary | ICD-10-CM | POA: Diagnosis not present

## 2024-07-30 DIAGNOSIS — D696 Thrombocytopenia, unspecified: Secondary | ICD-10-CM | POA: Diagnosis not present

## 2024-07-30 DIAGNOSIS — I10 Essential (primary) hypertension: Secondary | ICD-10-CM | POA: Diagnosis not present

## 2024-07-30 DIAGNOSIS — N3090 Cystitis, unspecified without hematuria: Secondary | ICD-10-CM | POA: Diagnosis not present

## 2024-07-30 DIAGNOSIS — Z48816 Encounter for surgical aftercare following surgery on the genitourinary system: Secondary | ICD-10-CM | POA: Diagnosis not present

## 2024-07-30 DIAGNOSIS — B961 Klebsiella pneumoniae [K. pneumoniae] as the cause of diseases classified elsewhere: Secondary | ICD-10-CM | POA: Diagnosis not present

## 2024-08-01 DIAGNOSIS — I10 Essential (primary) hypertension: Secondary | ICD-10-CM | POA: Diagnosis not present

## 2024-08-01 DIAGNOSIS — B961 Klebsiella pneumoniae [K. pneumoniae] as the cause of diseases classified elsewhere: Secondary | ICD-10-CM | POA: Diagnosis not present

## 2024-08-01 DIAGNOSIS — J309 Allergic rhinitis, unspecified: Secondary | ICD-10-CM | POA: Diagnosis not present

## 2024-08-01 DIAGNOSIS — N3090 Cystitis, unspecified without hematuria: Secondary | ICD-10-CM | POA: Diagnosis not present

## 2024-08-01 DIAGNOSIS — D696 Thrombocytopenia, unspecified: Secondary | ICD-10-CM | POA: Diagnosis not present

## 2024-08-01 DIAGNOSIS — Z48816 Encounter for surgical aftercare following surgery on the genitourinary system: Secondary | ICD-10-CM | POA: Diagnosis not present

## 2024-08-02 DIAGNOSIS — H35351 Cystoid macular degeneration, right eye: Secondary | ICD-10-CM | POA: Diagnosis not present

## 2024-08-06 DIAGNOSIS — N3090 Cystitis, unspecified without hematuria: Secondary | ICD-10-CM | POA: Diagnosis not present

## 2024-08-06 DIAGNOSIS — D696 Thrombocytopenia, unspecified: Secondary | ICD-10-CM | POA: Diagnosis not present

## 2024-08-06 DIAGNOSIS — I10 Essential (primary) hypertension: Secondary | ICD-10-CM | POA: Diagnosis not present

## 2024-08-06 DIAGNOSIS — B961 Klebsiella pneumoniae [K. pneumoniae] as the cause of diseases classified elsewhere: Secondary | ICD-10-CM | POA: Diagnosis not present

## 2024-08-06 DIAGNOSIS — J309 Allergic rhinitis, unspecified: Secondary | ICD-10-CM | POA: Diagnosis not present

## 2024-08-06 DIAGNOSIS — Z48816 Encounter for surgical aftercare following surgery on the genitourinary system: Secondary | ICD-10-CM | POA: Diagnosis not present

## 2024-08-07 ENCOUNTER — Other Ambulatory Visit: Payer: Self-pay

## 2024-08-07 DIAGNOSIS — J309 Allergic rhinitis, unspecified: Secondary | ICD-10-CM | POA: Diagnosis not present

## 2024-08-07 DIAGNOSIS — D696 Thrombocytopenia, unspecified: Secondary | ICD-10-CM | POA: Diagnosis not present

## 2024-08-07 DIAGNOSIS — Z48816 Encounter for surgical aftercare following surgery on the genitourinary system: Secondary | ICD-10-CM | POA: Diagnosis not present

## 2024-08-07 DIAGNOSIS — N3090 Cystitis, unspecified without hematuria: Secondary | ICD-10-CM | POA: Diagnosis not present

## 2024-08-07 DIAGNOSIS — I10 Essential (primary) hypertension: Secondary | ICD-10-CM

## 2024-08-07 DIAGNOSIS — B961 Klebsiella pneumoniae [K. pneumoniae] as the cause of diseases classified elsewhere: Secondary | ICD-10-CM | POA: Diagnosis not present

## 2024-08-08 MED ORDER — LOSARTAN POTASSIUM-HCTZ 100-12.5 MG PO TABS: 1.0000 | ORAL_TABLET | Freq: Every day | ORAL | 3 refills | Status: DC

## 2024-08-15 DIAGNOSIS — I10 Essential (primary) hypertension: Secondary | ICD-10-CM | POA: Diagnosis not present

## 2024-08-15 DIAGNOSIS — N3090 Cystitis, unspecified without hematuria: Secondary | ICD-10-CM | POA: Diagnosis not present

## 2024-08-15 DIAGNOSIS — D696 Thrombocytopenia, unspecified: Secondary | ICD-10-CM | POA: Diagnosis not present

## 2024-08-15 DIAGNOSIS — B961 Klebsiella pneumoniae [K. pneumoniae] as the cause of diseases classified elsewhere: Secondary | ICD-10-CM | POA: Diagnosis not present

## 2024-08-15 DIAGNOSIS — Z48816 Encounter for surgical aftercare following surgery on the genitourinary system: Secondary | ICD-10-CM | POA: Diagnosis not present

## 2024-08-15 DIAGNOSIS — J309 Allergic rhinitis, unspecified: Secondary | ICD-10-CM | POA: Diagnosis not present

## 2024-08-16 DIAGNOSIS — D696 Thrombocytopenia, unspecified: Secondary | ICD-10-CM | POA: Diagnosis not present

## 2024-08-16 DIAGNOSIS — N3090 Cystitis, unspecified without hematuria: Secondary | ICD-10-CM | POA: Diagnosis not present

## 2024-08-16 DIAGNOSIS — J309 Allergic rhinitis, unspecified: Secondary | ICD-10-CM | POA: Diagnosis not present

## 2024-08-16 DIAGNOSIS — Z48816 Encounter for surgical aftercare following surgery on the genitourinary system: Secondary | ICD-10-CM | POA: Diagnosis not present

## 2024-08-16 DIAGNOSIS — I10 Essential (primary) hypertension: Secondary | ICD-10-CM | POA: Diagnosis not present

## 2024-08-16 DIAGNOSIS — B961 Klebsiella pneumoniae [K. pneumoniae] as the cause of diseases classified elsewhere: Secondary | ICD-10-CM | POA: Diagnosis not present

## 2024-08-20 DIAGNOSIS — E785 Hyperlipidemia, unspecified: Secondary | ICD-10-CM | POA: Diagnosis not present

## 2024-08-20 DIAGNOSIS — E559 Vitamin D deficiency, unspecified: Secondary | ICD-10-CM | POA: Diagnosis not present

## 2024-08-20 DIAGNOSIS — B961 Klebsiella pneumoniae [K. pneumoniae] as the cause of diseases classified elsewhere: Secondary | ICD-10-CM | POA: Diagnosis not present

## 2024-08-20 DIAGNOSIS — Z48816 Encounter for surgical aftercare following surgery on the genitourinary system: Secondary | ICD-10-CM | POA: Diagnosis not present

## 2024-08-20 DIAGNOSIS — Z556 Problems related to health literacy: Secondary | ICD-10-CM | POA: Diagnosis not present

## 2024-08-20 DIAGNOSIS — D696 Thrombocytopenia, unspecified: Secondary | ICD-10-CM | POA: Diagnosis not present

## 2024-08-20 DIAGNOSIS — G809 Cerebral palsy, unspecified: Secondary | ICD-10-CM | POA: Diagnosis not present

## 2024-08-20 DIAGNOSIS — N3090 Cystitis, unspecified without hematuria: Secondary | ICD-10-CM | POA: Diagnosis not present

## 2024-08-20 DIAGNOSIS — M419 Scoliosis, unspecified: Secondary | ICD-10-CM | POA: Diagnosis not present

## 2024-08-20 DIAGNOSIS — H9190 Unspecified hearing loss, unspecified ear: Secondary | ICD-10-CM | POA: Diagnosis not present

## 2024-08-20 DIAGNOSIS — J309 Allergic rhinitis, unspecified: Secondary | ICD-10-CM | POA: Diagnosis not present

## 2024-08-20 DIAGNOSIS — I10 Essential (primary) hypertension: Secondary | ICD-10-CM | POA: Diagnosis not present

## 2024-08-20 DIAGNOSIS — Z79899 Other long term (current) drug therapy: Secondary | ICD-10-CM | POA: Diagnosis not present

## 2024-08-20 DIAGNOSIS — Z993 Dependence on wheelchair: Secondary | ICD-10-CM | POA: Diagnosis not present

## 2024-08-20 DIAGNOSIS — H409 Unspecified glaucoma: Secondary | ICD-10-CM | POA: Diagnosis not present

## 2024-08-21 ENCOUNTER — Telehealth: Payer: Self-pay

## 2024-08-21 NOTE — Telephone Encounter (Signed)
 Jennifer from [employer] calling for PT verbal orders as follows:  1 time(s) weekly for 9 week(s).  Verbal orders given per [facility] protocol  [name], RN

## 2024-08-22 DIAGNOSIS — J309 Allergic rhinitis, unspecified: Secondary | ICD-10-CM | POA: Diagnosis not present

## 2024-08-22 DIAGNOSIS — N3090 Cystitis, unspecified without hematuria: Secondary | ICD-10-CM | POA: Diagnosis not present

## 2024-08-22 DIAGNOSIS — Z48816 Encounter for surgical aftercare following surgery on the genitourinary system: Secondary | ICD-10-CM | POA: Diagnosis not present

## 2024-08-22 DIAGNOSIS — D696 Thrombocytopenia, unspecified: Secondary | ICD-10-CM | POA: Diagnosis not present

## 2024-08-22 DIAGNOSIS — B961 Klebsiella pneumoniae [K. pneumoniae] as the cause of diseases classified elsewhere: Secondary | ICD-10-CM | POA: Diagnosis not present

## 2024-08-22 DIAGNOSIS — I10 Essential (primary) hypertension: Secondary | ICD-10-CM | POA: Diagnosis not present

## 2024-08-29 DIAGNOSIS — I10 Essential (primary) hypertension: Secondary | ICD-10-CM | POA: Diagnosis not present

## 2024-08-29 DIAGNOSIS — Z48816 Encounter for surgical aftercare following surgery on the genitourinary system: Secondary | ICD-10-CM | POA: Diagnosis not present

## 2024-08-29 DIAGNOSIS — B961 Klebsiella pneumoniae [K. pneumoniae] as the cause of diseases classified elsewhere: Secondary | ICD-10-CM | POA: Diagnosis not present

## 2024-08-29 DIAGNOSIS — N3090 Cystitis, unspecified without hematuria: Secondary | ICD-10-CM | POA: Diagnosis not present

## 2024-08-29 DIAGNOSIS — D696 Thrombocytopenia, unspecified: Secondary | ICD-10-CM | POA: Diagnosis not present

## 2024-08-29 DIAGNOSIS — J309 Allergic rhinitis, unspecified: Secondary | ICD-10-CM | POA: Diagnosis not present

## 2024-08-31 DIAGNOSIS — Z23 Encounter for immunization: Secondary | ICD-10-CM | POA: Diagnosis not present

## 2024-09-02 DIAGNOSIS — H35351 Cystoid macular degeneration, right eye: Secondary | ICD-10-CM | POA: Diagnosis not present

## 2024-09-05 DIAGNOSIS — J309 Allergic rhinitis, unspecified: Secondary | ICD-10-CM | POA: Diagnosis not present

## 2024-09-05 DIAGNOSIS — B961 Klebsiella pneumoniae [K. pneumoniae] as the cause of diseases classified elsewhere: Secondary | ICD-10-CM | POA: Diagnosis not present

## 2024-09-05 DIAGNOSIS — N3090 Cystitis, unspecified without hematuria: Secondary | ICD-10-CM | POA: Diagnosis not present

## 2024-09-05 DIAGNOSIS — I10 Essential (primary) hypertension: Secondary | ICD-10-CM | POA: Diagnosis not present

## 2024-09-05 DIAGNOSIS — Z48816 Encounter for surgical aftercare following surgery on the genitourinary system: Secondary | ICD-10-CM | POA: Diagnosis not present

## 2024-09-05 DIAGNOSIS — D696 Thrombocytopenia, unspecified: Secondary | ICD-10-CM | POA: Diagnosis not present

## 2024-09-11 ENCOUNTER — Encounter: Payer: Self-pay | Admitting: Student

## 2024-09-11 ENCOUNTER — Other Ambulatory Visit: Payer: Self-pay

## 2024-09-11 DIAGNOSIS — E785 Hyperlipidemia, unspecified: Secondary | ICD-10-CM

## 2024-09-12 DIAGNOSIS — I10 Essential (primary) hypertension: Secondary | ICD-10-CM | POA: Diagnosis not present

## 2024-09-12 DIAGNOSIS — J309 Allergic rhinitis, unspecified: Secondary | ICD-10-CM | POA: Diagnosis not present

## 2024-09-12 DIAGNOSIS — D696 Thrombocytopenia, unspecified: Secondary | ICD-10-CM | POA: Diagnosis not present

## 2024-09-12 DIAGNOSIS — N3090 Cystitis, unspecified without hematuria: Secondary | ICD-10-CM | POA: Diagnosis not present

## 2024-09-12 DIAGNOSIS — B961 Klebsiella pneumoniae [K. pneumoniae] as the cause of diseases classified elsewhere: Secondary | ICD-10-CM | POA: Diagnosis not present

## 2024-09-12 DIAGNOSIS — Z48816 Encounter for surgical aftercare following surgery on the genitourinary system: Secondary | ICD-10-CM | POA: Diagnosis not present

## 2024-09-19 DIAGNOSIS — E785 Hyperlipidemia, unspecified: Secondary | ICD-10-CM | POA: Diagnosis not present

## 2024-09-19 DIAGNOSIS — Z48816 Encounter for surgical aftercare following surgery on the genitourinary system: Secondary | ICD-10-CM | POA: Diagnosis not present

## 2024-09-19 DIAGNOSIS — D696 Thrombocytopenia, unspecified: Secondary | ICD-10-CM | POA: Diagnosis not present

## 2024-09-19 DIAGNOSIS — E559 Vitamin D deficiency, unspecified: Secondary | ICD-10-CM | POA: Diagnosis not present

## 2024-09-19 DIAGNOSIS — N3090 Cystitis, unspecified without hematuria: Secondary | ICD-10-CM | POA: Diagnosis not present

## 2024-09-19 DIAGNOSIS — Z993 Dependence on wheelchair: Secondary | ICD-10-CM | POA: Diagnosis not present

## 2024-09-19 DIAGNOSIS — Z79899 Other long term (current) drug therapy: Secondary | ICD-10-CM | POA: Diagnosis not present

## 2024-09-19 DIAGNOSIS — H9190 Unspecified hearing loss, unspecified ear: Secondary | ICD-10-CM | POA: Diagnosis not present

## 2024-09-19 DIAGNOSIS — M419 Scoliosis, unspecified: Secondary | ICD-10-CM | POA: Diagnosis not present

## 2024-09-19 DIAGNOSIS — B961 Klebsiella pneumoniae [K. pneumoniae] as the cause of diseases classified elsewhere: Secondary | ICD-10-CM | POA: Diagnosis not present

## 2024-09-19 DIAGNOSIS — I10 Essential (primary) hypertension: Secondary | ICD-10-CM | POA: Diagnosis not present

## 2024-09-19 DIAGNOSIS — J309 Allergic rhinitis, unspecified: Secondary | ICD-10-CM | POA: Diagnosis not present

## 2024-09-19 DIAGNOSIS — G809 Cerebral palsy, unspecified: Secondary | ICD-10-CM | POA: Diagnosis not present

## 2024-09-19 DIAGNOSIS — H409 Unspecified glaucoma: Secondary | ICD-10-CM | POA: Diagnosis not present

## 2024-09-19 DIAGNOSIS — Z556 Problems related to health literacy: Secondary | ICD-10-CM | POA: Diagnosis not present

## 2024-09-23 ENCOUNTER — Ambulatory Visit

## 2024-09-26 DIAGNOSIS — Z48816 Encounter for surgical aftercare following surgery on the genitourinary system: Secondary | ICD-10-CM | POA: Diagnosis not present

## 2024-09-26 DIAGNOSIS — B961 Klebsiella pneumoniae [K. pneumoniae] as the cause of diseases classified elsewhere: Secondary | ICD-10-CM | POA: Diagnosis not present

## 2024-09-26 DIAGNOSIS — J309 Allergic rhinitis, unspecified: Secondary | ICD-10-CM | POA: Diagnosis not present

## 2024-09-26 DIAGNOSIS — N3090 Cystitis, unspecified without hematuria: Secondary | ICD-10-CM | POA: Diagnosis not present

## 2024-09-26 DIAGNOSIS — I10 Essential (primary) hypertension: Secondary | ICD-10-CM | POA: Diagnosis not present

## 2024-09-26 DIAGNOSIS — D696 Thrombocytopenia, unspecified: Secondary | ICD-10-CM | POA: Diagnosis not present

## 2024-09-30 ENCOUNTER — Ambulatory Visit (INDEPENDENT_AMBULATORY_CARE_PROVIDER_SITE_OTHER)

## 2024-09-30 VITALS — BP 121/84 | HR 86 | Wt 163.8 lb

## 2024-09-30 DIAGNOSIS — G800 Spastic quadriplegic cerebral palsy: Secondary | ICD-10-CM

## 2024-09-30 DIAGNOSIS — Z23 Encounter for immunization: Secondary | ICD-10-CM

## 2024-09-30 MED ORDER — TETANUS-DIPHTH-ACELL PERTUSSIS 5-2.5-18.5 LF-MCG/0.5 IM SUSP
0.5000 mL | Freq: Once | INTRAMUSCULAR | 0 refills | Status: AC
Start: 2024-09-30 — End: 2024-09-30

## 2024-09-30 NOTE — Assessment & Plan Note (Addendum)
 Patient with history of contractures.  Unable to fully extend left knee.  Inversion of bilateral feet.  Braces recommended by PT to aid in transfers.  Family set up with representative from [employer], who will be aiding them in receiving this brace. - DME order for bilateral AFOs  - DME order for [employer] recommended by PT

## 2024-09-30 NOTE — Progress Notes (Addendum)
    SUBJECTIVE:   CHIEF COMPLAINT / HPI: Right knee/foot braces  Patient presents today with his mother and father.  A sign language interpreter is present for the entire visit.  The patient is here for left sided knee/feet braces.  Both of these were recommended by patient's physical therapist to aid in transfers.  According to patient's mother the only time he stands is during transfers.  Patient is unable to fully extend his left knee.  They are recommending a brace called [employer].  A representative from this company is in contact with their family.  His mother states that they are going to help with the coordination of getting this brace.  Physical therapy also recommends bracing his feet for better transfers.  Both patient's feet roll outward.  Patient has a history of cerebral palsy with contractures and is wheelchair dependent.  Patient's parents tell me that he received both his COVID and flu shot this September at [place].  They are also wondering if he is due for a new tetanus shot.  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  Unable to fully extend left knee.  Inversion of bilateral feet.  Braces recommended by PT to aid in transfers.  Family set up with representative from [employer], who will be aiding them in receiving this brace. - DME order for bilateral AFOs  - DME order for [employer] recommended by PT Need for Tdap vaccination Last Tdap  was in July 2015. - Prescription for Tdap vaccination was sent to patient's pharmacy     Raguel KANDICE Lee, DO [facility]

## 2024-09-30 NOTE — Patient Instructions (Addendum)
 I have put in the orders for the right knee brace and braces for both feet. It looks like Ed last got blood work done in early June. Please come back around June of next year for an annual physical.

## 2024-10-02 ENCOUNTER — Telehealth: Payer: Self-pay

## 2024-10-02 ENCOUNTER — Encounter: Payer: Self-pay | Admitting: Student

## 2024-10-02 DIAGNOSIS — G800 Spastic quadriplegic cerebral palsy: Secondary | ICD-10-CM

## 2024-10-02 NOTE — Telephone Encounter (Signed)
 Received VM from patient's mother regarding AFO and leg splint orders.   She said that we needed to send orders to a specific PT. Attempted to call mother to get further information regarding where orders needed to be sent.   She did not answer, LVM asking her to return call to provide information.   Edward JAYSON English, RN

## 2024-10-02 NOTE — Telephone Encounter (Signed)
 See clinic note from 09/30/2024.   Edward KANDICE Lee, DO

## 2024-10-03 DIAGNOSIS — N3090 Cystitis, unspecified without hematuria: Secondary | ICD-10-CM | POA: Diagnosis not present

## 2024-10-03 DIAGNOSIS — D696 Thrombocytopenia, unspecified: Secondary | ICD-10-CM | POA: Diagnosis not present

## 2024-10-03 DIAGNOSIS — B961 Klebsiella pneumoniae [K. pneumoniae] as the cause of diseases classified elsewhere: Secondary | ICD-10-CM | POA: Diagnosis not present

## 2024-10-03 DIAGNOSIS — J309 Allergic rhinitis, unspecified: Secondary | ICD-10-CM | POA: Diagnosis not present

## 2024-10-03 DIAGNOSIS — Z48816 Encounter for surgical aftercare following surgery on the genitourinary system: Secondary | ICD-10-CM | POA: Diagnosis not present

## 2024-10-03 DIAGNOSIS — I10 Essential (primary) hypertension: Secondary | ICD-10-CM | POA: Diagnosis not present

## 2024-10-03 NOTE — Telephone Encounter (Signed)
 Spoke with patients mother.   She reports the knee brace needs to be reordered for left knee and not the right.   She reports once this is corrected to fax order and AFO order to Rep at [facility].  Tristan: [phone]

## 2024-10-04 NOTE — Addendum Note (Signed)
 Addended by: LENNIE RAGUEL MATSU on: 10/04/2024 08:50 AM   Modules accepted: Orders

## 2024-10-07 NOTE — Telephone Encounter (Signed)
 Order faxed to [facility].

## 2024-10-07 NOTE — Telephone Encounter (Signed)
 Both orders faxed to [facility].

## 2024-10-08 NOTE — Telephone Encounter (Signed)
 Lanae with [employer] calls nurse line. He reports that office note needs to be updated to reflect patient's left knee for Jazz brace. Currently note indicates that brace is for right knee.  Please update note with these modifications.   He is also faxing over prescription for the brace that will need to be signed by Dr. Lennie.    [name], RN

## 2024-10-08 NOTE — Progress Notes (Signed)
 Addendum to note from 09/30/2024  Spastic quadriplegic cerebral palsy Patient unable to fully extend left knee. Brace recommended by PT.  - [employer] brace ordered

## 2024-10-09 ENCOUNTER — Telehealth: Payer: Self-pay

## 2024-10-09 DIAGNOSIS — Z48816 Encounter for surgical aftercare following surgery on the genitourinary system: Secondary | ICD-10-CM | POA: Diagnosis not present

## 2024-10-09 DIAGNOSIS — J309 Allergic rhinitis, unspecified: Secondary | ICD-10-CM | POA: Diagnosis not present

## 2024-10-09 DIAGNOSIS — N3090 Cystitis, unspecified without hematuria: Secondary | ICD-10-CM | POA: Diagnosis not present

## 2024-10-09 DIAGNOSIS — I10 Essential (primary) hypertension: Secondary | ICD-10-CM | POA: Diagnosis not present

## 2024-10-09 DIAGNOSIS — D696 Thrombocytopenia, unspecified: Secondary | ICD-10-CM | POA: Diagnosis not present

## 2024-10-09 DIAGNOSIS — B961 Klebsiella pneumoniae [K. pneumoniae] as the cause of diseases classified elsewhere: Secondary | ICD-10-CM | POA: Diagnosis not present

## 2024-10-09 NOTE — Telephone Encounter (Signed)
 Placed a form in your box that the patient needs completed.

## 2024-10-09 NOTE — Telephone Encounter (Signed)
 Pt dad dropped off form at front desk for AFO order forms.  Verified that patient section of form has been completed.  Last DOS/WCC with PCP was 09/30/2024.  Placed form in [facility] team folder to be completed by clinical staff.  Marjorie DELENA Kiang

## 2024-10-09 NOTE — Telephone Encounter (Signed)
 Received returned call from Tristan. Form will need to be signed by Dr. Orie, as Dr. Lennie is a resident.   Please have attending sign and fax back to provided number on form.   [name], RN

## 2024-10-14 DIAGNOSIS — D696 Thrombocytopenia, unspecified: Secondary | ICD-10-CM | POA: Diagnosis not present

## 2024-10-14 DIAGNOSIS — N3090 Cystitis, unspecified without hematuria: Secondary | ICD-10-CM | POA: Diagnosis not present

## 2024-10-14 DIAGNOSIS — J309 Allergic rhinitis, unspecified: Secondary | ICD-10-CM | POA: Diagnosis not present

## 2024-10-14 DIAGNOSIS — I10 Essential (primary) hypertension: Secondary | ICD-10-CM | POA: Diagnosis not present

## 2024-10-14 DIAGNOSIS — Z48816 Encounter for surgical aftercare following surgery on the genitourinary system: Secondary | ICD-10-CM | POA: Diagnosis not present

## 2024-10-14 DIAGNOSIS — B961 Klebsiella pneumoniae [K. pneumoniae] as the cause of diseases classified elsewhere: Secondary | ICD-10-CM | POA: Diagnosis not present

## 2024-10-14 NOTE — Telephone Encounter (Signed)
 Signed forms, placed in the fax box

## 2024-10-15 ENCOUNTER — Telehealth: Payer: Self-pay

## 2024-10-15 NOTE — Telephone Encounter (Signed)
 Patient's dad came in stating he was trying to give Dr.Baker a message but mychart wouldn't allow him too. Patients dad asked if Dr.Baker could give him a message so he would be able to contact her.  Patient's dad also called mychart and they told him to come here and to speak with us  about it.

## 2024-10-16 NOTE — Telephone Encounter (Signed)
 Checked previous faxed file. This paperwork was already faxed on 10/14/24.  [name], RN

## 2024-10-17 ENCOUNTER — Other Ambulatory Visit: Payer: Self-pay

## 2024-10-17 ENCOUNTER — Telehealth: Payer: Self-pay

## 2024-10-17 DIAGNOSIS — G809 Cerebral palsy, unspecified: Secondary | ICD-10-CM

## 2024-10-17 NOTE — Assessment & Plan Note (Signed)
-   Per Dr. Honore note and plan - Patient with history of contractures.  Unable to fully extend LEFT knee.  Inversion of bilateral feet.  Braces recommended by PT to aid in transfers.  Family set up with representative from [employer], who will be aiding them in receiving this brace. - DME order for bilateral AFOs  - DME order for Left Knee [employer] recommended by PT

## 2024-10-17 NOTE — Telephone Encounter (Signed)
 Edward Alvarado with [facility] LVM on nurse line requesting updated OV notes.   He reports the note needs to be addended to reflect the need for a LEFT knee brace.   Per OV note on 11/17, the note has an addendum for LEFT knee brace, however the note also continues to state RIGHT.  I attempted to call Edward Alvarado back to discuss before we release records, however no answer.   VM left to call me back at the office to discuss.

## 2024-10-17 NOTE — Telephone Encounter (Signed)
 The original note from 11/17 needs to be addended to reflect that brace is for the left knee.   There cannot be conflicting documentation within the note.   [name], RN

## 2024-10-17 NOTE — Telephone Encounter (Signed)
    SUBJECTIVE:   [name] is a 43 YO male who presented with his parents and exam by Dr. Lennie on 09/30/24. Please see progress note on 09/30/24 by Dr. Lennie for additional information. Per chart According to patient's mother the only time he stands is during transfers.  Patient is unable to fully extend his LEFT knee which Physical Therapy have recommending a brace called [employer] to help with his transfer.   OBJECTIVE:   Per. Dr. Honore note on 09/30/24  ASSESSMENT/PLAN:   Assessment & Plan Cerebral palsy, unspecified type (HCC) - Per Dr. Honore note and plan - Patient with history of contractures.  Unable to fully extend LEFT knee.  Inversion of bilateral feet.  Braces recommended by PT to aid in transfers.  Family set up with representative from [employer], who will be aiding them in receiving this brace. - DME order for bilateral AFOs  - DME order for Left Knee [employer] recommended by PT    Edward Samuels, DO [facility]

## 2024-10-17 NOTE — Telephone Encounter (Signed)
 Received returned call from Tristan.   He reports that the entire note needs to indicate that the need is for the left knee, otherwise, insurance will likely not cover.   Once the note is correctly edited with this information, OV note will need to be faxed back to [facility] at [phone], attention Airel Lauran.   [name], RN

## 2024-10-17 NOTE — Addendum Note (Signed)
 Addended by: CORALEE ELDER on: 10/17/2024 11:50 AM   Modules accepted: Orders

## 2024-10-18 MED ORDER — VITAMIN D3 25 MCG (1000 UNIT) PO TABS: 1000.0000 [IU] | ORAL_TABLET | Freq: Every day | ORAL | 1 refills | Status: AC

## 2024-10-18 NOTE — Telephone Encounter (Signed)
 Lanae called to check status of documents.   Advised that PCP is currently out of the office and we will be asking her to addend original note from 11/17 when she is back in the office.   Dr. Lennie- please let me know if you have further questions regarding this matter.   Thank you!  [name], RN

## 2024-10-22 DIAGNOSIS — H401332 Pigmentary glaucoma, bilateral, moderate stage: Secondary | ICD-10-CM | POA: Diagnosis not present

## 2024-10-22 NOTE — Telephone Encounter (Signed)
 Lanae calls back to nurse line to check status.   He reports he and the patients family are hoping to have this taken care of in the next 48 hours.   Informed Lanae of brief set backs on our end.   Will forward to PCP.

## 2024-10-23 NOTE — Telephone Encounter (Signed)
 AFO Script and addended office visit notes faxed to [facility]   [phone]

## 2024-11-01 ENCOUNTER — Telehealth: Payer: Self-pay

## 2024-11-01 DIAGNOSIS — G800 Spastic quadriplegic cerebral palsy: Secondary | ICD-10-CM

## 2024-11-01 DIAGNOSIS — G809 Cerebral palsy, unspecified: Secondary | ICD-10-CM

## 2024-11-01 NOTE — Telephone Encounter (Signed)
 [facility] calls nurse line in regards to wheelchair repairs.   Edward Alvarado reports she has faxed over an order form for completion.   Form in PCP box for review. This does need to be signed by [name].   Recent office notes need to be faxed as well once completed.   Will forward to PCP.

## 2024-11-01 NOTE — Telephone Encounter (Signed)
 Patient's mother calls nurse line regarding referral for Home Health PT. She reports that patient was discharged due to not having brace.   Brace was received yesterday. Mother would like for patient to be restarted with PT. They will need new referral for Home Health faced to [phone], attention Baeryl.   Forwarding to PCP.   [name], RN

## 2024-11-04 NOTE — Telephone Encounter (Signed)
 Community message sent to Edward Alvarado for referral.   Edward JAYSON English, RN

## 2024-11-11 ENCOUNTER — Telehealth: Payer: Self-pay

## 2024-11-11 NOTE — Telephone Encounter (Signed)
 Lauren PT with [facility] calls nurse line requesting verbal orders for [facility] PT as follows.   2x a week for 1 week. 1x a week for 8 weeks.   Verbal orders given per [facility] protocol.

## 2024-11-21 ENCOUNTER — Other Ambulatory Visit: Payer: Self-pay

## 2024-11-21 DIAGNOSIS — I10 Essential (primary) hypertension: Secondary | ICD-10-CM

## 2024-11-28 ENCOUNTER — Ambulatory Visit

## 2024-12-06 ENCOUNTER — Other Ambulatory Visit: Payer: Self-pay

## 2024-12-06 DIAGNOSIS — E785 Hyperlipidemia, unspecified: Secondary | ICD-10-CM

## 2024-12-26 ENCOUNTER — Ambulatory Visit
